# Patient Record
Sex: Male | Born: 1945 | Race: White | Hispanic: No | Marital: Married | Smoking: Never smoker
Health system: Southern US, Community
[De-identification: ages and names within clinical notes are randomized; demographics above are authoritative.]

## PROBLEM LIST (undated history)

## (undated) DIAGNOSIS — M1990 Unspecified osteoarthritis, unspecified site: Secondary | ICD-10-CM

## (undated) DIAGNOSIS — M51369 Other intervertebral disc degeneration, lumbar region without mention of lumbar back pain or lower extremity pain: Secondary | ICD-10-CM

## (undated) DIAGNOSIS — M5126 Other intervertebral disc displacement, lumbar region: Secondary | ICD-10-CM

## (undated) DIAGNOSIS — I2510 Atherosclerotic heart disease of native coronary artery without angina pectoris: Secondary | ICD-10-CM

## (undated) DIAGNOSIS — C61 Malignant neoplasm of prostate: Secondary | ICD-10-CM

## (undated) DIAGNOSIS — R0609 Other forms of dyspnea: Secondary | ICD-10-CM

## (undated) DIAGNOSIS — K219 Gastro-esophageal reflux disease without esophagitis: Secondary | ICD-10-CM

## (undated) DIAGNOSIS — M5136 Other intervertebral disc degeneration, lumbar region: Secondary | ICD-10-CM

## (undated) DIAGNOSIS — M545 Low back pain, unspecified: Secondary | ICD-10-CM

## (undated) DIAGNOSIS — F419 Anxiety disorder, unspecified: Secondary | ICD-10-CM

## (undated) DIAGNOSIS — G8929 Other chronic pain: Secondary | ICD-10-CM

## (undated) DIAGNOSIS — R011 Cardiac murmur, unspecified: Secondary | ICD-10-CM

## (undated) DIAGNOSIS — N486 Induration penis plastica: Secondary | ICD-10-CM

## (undated) DIAGNOSIS — I6523 Occlusion and stenosis of bilateral carotid arteries: Secondary | ICD-10-CM

## (undated) HISTORY — PX: OPEN TREATMENT ZYGOMATIC ARCH FRACTURE: SUR912

## (undated) HISTORY — DX: Occlusion and stenosis of bilateral carotid arteries: I65.23

## (undated) HISTORY — PX: FRACTURE SURGERY: SHX138

## (undated) HISTORY — DX: Malignant neoplasm of prostate: C61

## (undated) HISTORY — PX: PROSTATE BIOPSY: SHX241

## (undated) HISTORY — DX: Induration penis plastica: N48.6

## (undated) HISTORY — DX: Cardiac murmur, unspecified: R01.1

## (undated) HISTORY — DX: Other forms of dyspnea: R06.09

## (undated) HISTORY — DX: Unspecified osteoarthritis, unspecified site: M19.90

---
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
## 1978-11-16 HISTORY — PX: FINGER FRACTURE SURGERY: SHX638

## 1998-10-17 ENCOUNTER — Encounter: Payer: Self-pay | Admitting: Family Medicine

## 1998-10-17 ENCOUNTER — Ambulatory Visit (HOSPITAL_COMMUNITY): Admission: RE | Admit: 1998-10-17 | Discharge: 1998-10-17 | Payer: Self-pay | Admitting: Family Medicine

## 2000-06-19 ENCOUNTER — Encounter: Admission: RE | Admit: 2000-06-19 | Discharge: 2000-06-19 | Payer: Self-pay | Admitting: Family Medicine

## 2000-06-19 ENCOUNTER — Encounter: Payer: Self-pay | Admitting: Family Medicine

## 2002-10-26 ENCOUNTER — Encounter: Admission: RE | Admit: 2002-10-26 | Discharge: 2002-10-26 | Payer: Self-pay | Admitting: Family Medicine

## 2002-10-26 ENCOUNTER — Encounter: Payer: Self-pay | Admitting: Family Medicine

## 2004-12-18 ENCOUNTER — Encounter: Admission: RE | Admit: 2004-12-18 | Discharge: 2004-12-18 | Payer: Self-pay | Admitting: Family Medicine

## 2004-12-30 ENCOUNTER — Encounter: Admission: RE | Admit: 2004-12-30 | Discharge: 2004-12-30 | Payer: Self-pay | Admitting: Family Medicine

## 2009-10-26 ENCOUNTER — Encounter (INDEPENDENT_AMBULATORY_CARE_PROVIDER_SITE_OTHER): Payer: Self-pay | Admitting: *Deleted

## 2010-04-06 ENCOUNTER — Encounter: Payer: Self-pay | Admitting: Family Medicine

## 2010-04-15 ENCOUNTER — Other Ambulatory Visit: Payer: Self-pay | Admitting: Sports Medicine

## 2010-04-15 DIAGNOSIS — M545 Low back pain: Secondary | ICD-10-CM

## 2010-04-15 DIAGNOSIS — M5416 Radiculopathy, lumbar region: Secondary | ICD-10-CM

## 2010-04-16 NOTE — Letter (Signed)
Summary: Colonoscopy Date Change Letter  [facility]  [address] [place], [place] [address]   Phone: [phone]  Fax: [phone]      October 26, 2009 MRN: [id number]   Ronald House [address] [place], [place]  [address]   Dear [name],   Previously you were recommended to have a repeat colonoscopy around this time. Your chart was recently reviewed by Dr. Judie Petit T. Russella Dar of [facility]. Follow up colonoscopy is now recommended in September 2014. This revised recommendation is based on current, nationally recognized guidelines for colorectal cancer screening and polyp surveillance. These guidelines are endorsed by the American Cancer Society, [employer] on Colorectal Cancer as well as numerous other major medical organizations.  Please understand that our recommendation assumes that you do not have any new symptoms such as bleeding, a change in bowel habits, anemia, or significant abdominal discomfort. If you do have any concerning GI symptoms or want to discuss the guideline recommendations, please call to arrange an office visit at your earliest convenience. Otherwise we will keep you in our reminder system and contact you 1-2 months prior to the date listed above to schedule your next colonoscopy.  Thank you,  Judie Petit T. Russella Dar, M.D.  [facility] [phone]

## 2010-04-20 ENCOUNTER — Encounter: Payer: Self-pay | Admitting: Sports Medicine

## 2010-04-29 ENCOUNTER — Ambulatory Visit
Admission: RE | Admit: 2010-04-29 | Discharge: 2010-04-29 | Disposition: A | Discharge status: Home / Self Care | Payer: BC Managed Care – PPO | Source: Ambulatory Visit | Attending: Sports Medicine | Admitting: Sports Medicine

## 2010-04-29 DIAGNOSIS — M545 Low back pain: Secondary | ICD-10-CM

## 2010-04-29 DIAGNOSIS — M5416 Radiculopathy, lumbar region: Secondary | ICD-10-CM

## 2011-03-25 ENCOUNTER — Encounter: Payer: Self-pay | Admitting: Radiation Oncology

## 2011-03-25 NOTE — Progress Notes (Signed)
66 year old male. Married  06/19/2010 PSA 3.35  11/28/2010 PSA 5.56 (done at [facility])  01/14/2011 FISH negative. Scheduled with Dr. Massie Bougie at [facility], but had biopsy done in [place], because he is concerned with travel and post biopsy discomfort.  02/18/11 prostate biopsy revealed G-6 prostate cancer.   Initial consultation with Dr. Dayton Scrape scheduled for 03/25/2010 regarding I-125 seed therapy. CD of scans, labs, path, radiology, and office notes request from Dr. Massie Bougie at [facility].

## 2011-03-26 ENCOUNTER — Ambulatory Visit
Admission: RE | Admit: 2011-03-26 | Discharge: 2011-03-26 | Disposition: A | Discharge status: Home / Self Care | Payer: BC Managed Care – PPO | Source: Ambulatory Visit | Attending: Radiation Oncology | Admitting: Radiation Oncology

## 2011-03-26 ENCOUNTER — Encounter: Payer: Self-pay | Admitting: Radiation Oncology

## 2011-03-26 VITALS — BP 147/81 | HR 59 | Temp 97.7°F | Resp 18 | Ht 70.0 in | Wt 177.3 lb

## 2011-03-26 DIAGNOSIS — C61 Malignant neoplasm of prostate: Secondary | ICD-10-CM

## 2011-03-26 DIAGNOSIS — Z800 Family history of malignant neoplasm of digestive organs: Secondary | ICD-10-CM | POA: Insufficient documentation

## 2011-03-26 HISTORY — DX: Other intervertebral disc degeneration, lumbar region without mention of lumbar back pain or lower extremity pain: M51.369

## 2011-03-26 HISTORY — DX: Other intervertebral disc displacement, lumbar region: M51.26

## 2011-03-26 HISTORY — DX: Other intervertebral disc degeneration, lumbar region: M51.36

## 2011-03-26 NOTE — Progress Notes (Signed)
[facility] Radiation Oncology NEW PATIENT EVALUATION  Name: Ronald House MRN: [id number]  Date: 03/26/2011  DOB: 1945/12/22  Status: outpatient   CC:   Ronald Frock, MD,  Ronald Headings, MD    REFERRING PHYSICIAN: Jethro Bolus House, *   DIAGNOSIS: Stage TI C. favorable risk adenocarcinoma prostate    HISTORY OF PRESENT ILLNESS:  Ronald House is a 66 y.o. male who is seen today for the courtesy of Dr. Alexis House for evaluation of his stage TI C. favorable risk adenocarcinoma prostate. He had been seen at [facility] by Ronald House for [facility], and more recently by Ronald House for evaluation of hematuria. Apparently, no cause was found for his hematuria. However, he was noted to have a rise in his PSA from 3.35-5.6 over a period of 5 months. Ronald House performed ultrasound-guided biopsies on 02/18/2011 and he was found to have adenocarcinoma with a Gleason score of 6 (3+3) involving 5% of one core from the left apex. His gland volume was 51 cc. I do not have his prosthetic length. Aside from a history of gross hematuria he is doing well from a GU and GI standpoint. His IPP S. score is 7. He is potent.    PREVIOUS RADIATION THERAPY: No   PAST MEDICAL HISTORY:  has a past medical history of Peyronie House; Prostate cancer; Arthritis; Undiagnosed cardiac murmurs; and Bulging lumbar disc.     PAST SURGICAL HISTORY:  Past Surgical History  Procedure Date  . History of facial surgery     related to fracture  . History of hand repair     broken right index finger     FAMILY HISTORY: family history includes Benign prostatic hyperplasia in his father; Cancer in his father; and Heart House in his brother. his father had throat cancer at age 73 and died of congestive heart failure and 62. He believes that his mother died at age 74 from a medication error in a nursing home.   SOCIAL HISTORY:  reports that he has never smoked. He has never  used smokeless tobacco. He reports that he drinks alcohol. He reports that he does not use illicit drugs. 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   ALLERGIES: Review of patient's allergies indicates no known allergies.   MEDICATIONS:  Current Outpatient Prescriptions  Medication Sig Dispense Refill  . calcium carbonate (OS-CAL) 600 MG TABS Take 600 mg by mouth daily.       . fish oil-omega-3 fatty acids 1000 MG capsule Take 2 g by mouth daily.        . Multiple Vitamin (MULTIVITAMIN) tablet Take 1 tablet by mouth daily.        . cholecalciferol (VITAMIN D) 1000 UNITS tablet Take 1,000 Units by mouth daily.            REVIEW OF SYSTEMS:  Pertinent items are noted in HPI.    PHYSICAL EXAM:  height is 5\' 10"  (1.778 m) and weight is 177 lb 4.8 oz (80.423 kg). His oral temperature is 97.7 F (36.5 C). His blood pressure is 147/81 and his pulse is 59. His respiration is 18.    Head and neck examination grossly unremarkable. Nodes: Without palpable cervical or supraclavicular lymphadenopathy. Chest: Lungs clear. Back: Without spinal or CVA tenderness. Heart: Regular rate and rhythm. Abdomen: Without masses or organomegaly. Genitalia: Unremarkable to inspection. There is a dorsal penile plaque consistent with Ronald House. Rectal: The prostate gland is slightly enlarged  and is without focal induration or nodularity. Extremities: Without edema.   LABORATORY DATA:  No results found for this basename: WBC, HGB, HCT, MCV, PLT   No results found for this basename: NA, K, CL, CO2   No results found for this basename: ALT, AST, GGT, ALKPHOS, BILITOT      IMPRESSION: Stage TI C. favorable risk adenocarcinoma prostate. House explained to the patient and his wife that his prognosis is related to his stage, PSA level, and Gleason score. All are favorable. Other prognostic factors include House volume and PSA doubling time. His management options include surgery versus close  observation/surveillance, and radiation therapy. Radiation therapy options include seed implantation alone or external beam/IMRT. House discussed in detail the potential acute and late toxicities of radiation therapy. House think he is a good candidate for any of these options. He is somewhat interested in seed implantation and would like to proceed with a CT arch study. He understands that we would need to have an open arch with a prosthetic length and less than 5.0 cm for seed implantation. His gland size of 51 cc may be borderline. We'll give him scheduled for a CT arch study in the near future, and then he can decide on his course of therapy. If his gland is too large that he would not be interested in downsizing with Ronald House, however he could consider Ronald House for at least 8-12 months.   PLAN: As above. He'll return next week for his CT arch study.   House spent 60 minutes minutes face to face with the patient and more than 50% of that time was spent in counseling and/or coordination of care.

## 2011-03-26 NOTE — Progress Notes (Signed)
Patient presents to the clinic today accompanied by his wife for an initial consultation with Dr. Dayton Scrape reference prostate ca. Patient is alert and oriented to person, place, and time. No distress noted. Steady gait noted. Pleasant affect noted. Patient denies pain at this time. Patient reports hematuria with clots. Patient reports only slight burning at initiation of urination and bowel movements. Patient reports that his father had his prostate removed at age 44 because it was enlarged. Patient denies nausea, vomiting, diarrhea, constipation, lightheadedness or dizziness. Patient reports eating without difficulty. Denies weight loss. Patient reports he does not sleep well at night but that is a long standing issue.

## 2011-03-26 NOTE — Progress Notes (Signed)
Please see the Nurse Progress Note in the MD Initial Consult Encounter for this patient. 

## 2011-03-27 NOTE — Progress Notes (Signed)
Encounter addended by: Ardell Isaacs on: 03/27/2011 11:27 AM<BR>     Documentation filed: Visit Diagnoses, Notes Section

## 2011-03-27 NOTE — Progress Notes (Signed)
Encounter addended by: Ardell Isaacs on: 03/27/2011  1:21 PM<BR>     Documentation filed: Charges VN

## 2011-03-27 NOTE — Progress Notes (Signed)
Encounter addended by: Ardell Isaacs on: 03/27/2011  1:27 PM<BR>     Documentation filed: Inpatient Patient Education

## 2011-03-27 NOTE — Progress Notes (Signed)
Encounter addended by: Ardell Isaacs on: 03/27/2011 11:03 AM<BR>     Documentation filed: Inpatient Document Flowsheet

## 2011-03-27 NOTE — Progress Notes (Signed)
IPSS score of 7 documented. Complete PATIENT MEASURE OF DISTRESS worksheet with a score of 0 turned into Child psychotherapist.

## 2011-03-28 ENCOUNTER — Telehealth: Payer: Self-pay | Admitting: *Deleted

## 2011-03-31 ENCOUNTER — Ambulatory Visit
Admission: RE | Admit: 2011-03-31 | Discharge: 2011-03-31 | Disposition: A | Discharge status: Home / Self Care | Payer: BC Managed Care – PPO | Source: Ambulatory Visit | Attending: Radiation Oncology | Admitting: Radiation Oncology

## 2011-03-31 ENCOUNTER — Encounter: Payer: Self-pay | Admitting: Radiation Oncology

## 2011-03-31 VITALS — BP 144/84 | HR 58 | Resp 18

## 2011-03-31 DIAGNOSIS — C61 Malignant neoplasm of prostate: Secondary | ICD-10-CM

## 2011-03-31 NOTE — Progress Notes (Signed)
Treatment planning note: The patient was taken to the CT simulator. His pelvis was scanned. I contoured his prostate. I performed a CT arch study in his prostate volume was found to be 55.9 cc with a prosthetic length of 4.8 cm. His arch is open. He is a candidate for a seed implantation. I prescribing 14,500 cGy utilizing iodine-125 seeds. He will be implanted with the Nucletron system. We will coordinate his implant schedule with Dr. Patsi Sears.      Prostate volume: 55.9 cc Length: 4.8 cm. Arch open.

## 2011-04-08 ENCOUNTER — Other Ambulatory Visit: Payer: Self-pay | Admitting: Urology

## 2011-04-16 ENCOUNTER — Ambulatory Visit (HOSPITAL_BASED_OUTPATIENT_CLINIC_OR_DEPARTMENT_OTHER)
Admission: RE | Admit: 2011-04-16 | Discharge: 2011-04-16 | Disposition: A | Discharge status: Home / Self Care | Payer: BC Managed Care – PPO | Source: Ambulatory Visit | Attending: Urology | Admitting: Urology

## 2011-04-16 ENCOUNTER — Other Ambulatory Visit: Payer: Self-pay

## 2011-04-16 ENCOUNTER — Ambulatory Visit (HOSPITAL_COMMUNITY): Admission: RE | Admit: 2011-04-16 | Payer: BC Managed Care – PPO | Source: Ambulatory Visit

## 2011-04-16 ENCOUNTER — Ambulatory Visit
Admission: RE | Admit: 2011-04-16 | Discharge: 2011-04-16 | Disposition: A | Discharge status: Home / Self Care | Payer: BC Managed Care – PPO | Source: Ambulatory Visit | Attending: Urology | Admitting: Urology

## 2011-04-16 ENCOUNTER — Other Ambulatory Visit: Payer: Self-pay | Admitting: Urology

## 2011-04-16 DIAGNOSIS — Z01818 Encounter for other preprocedural examination: Secondary | ICD-10-CM

## 2011-04-16 DIAGNOSIS — Z01810 Encounter for preprocedural cardiovascular examination: Secondary | ICD-10-CM | POA: Insufficient documentation

## 2011-05-09 NOTE — Telephone Encounter (Signed)
xxx

## 2011-05-15 LAB — COMPREHENSIVE METABOLIC PANEL
ALT: 17 U/L (ref 0–53)
AST: 18 U/L (ref 0–37)
CO2: 32 mEq/L (ref 19–32)
Calcium: 9.7 mg/dL (ref 8.4–10.5)
Chloride: 104 mEq/L (ref 96–112)
Creatinine, Ser: 1.11 mg/dL (ref 0.50–1.35)
GFR calc Af Amer: 79 mL/min — ABNORMAL LOW (ref >90)
GFR calc non Af Amer: 68 mL/min — ABNORMAL LOW (ref >90)
Glucose, Bld: 94 mg/dL (ref 70–99)
Sodium: 144 mEq/L (ref 135–145)
Total Bilirubin: 0.4 mg/dL (ref 0.3–1.2)

## 2011-05-15 LAB — APTT: aPTT: 33 seconds (ref 24–37)

## 2011-05-15 LAB — CBC
Hemoglobin: 14.3 g/dL (ref 13.0–17.0)
MCH: 29.4 pg (ref 26.0–34.0)
MCV: 90.9 fL (ref 78.0–100.0)
Platelets: 226 10*3/uL (ref 150–400)
RBC: 4.86 MIL/uL (ref 4.22–5.81)
WBC: 7.5 10*3/uL (ref 4.0–10.5)

## 2011-05-19 ENCOUNTER — Encounter (HOSPITAL_BASED_OUTPATIENT_CLINIC_OR_DEPARTMENT_OTHER): Payer: Self-pay | Admitting: *Deleted

## 2011-05-19 NOTE — Progress Notes (Signed)
NPO AFTER MN. ARRIVES AT 0945. LAB RESULTS, CURRENT EKG AND CXR IN EPIC . WILL DO FLEET ENEMA AM OF SURG.

## 2011-05-21 ENCOUNTER — Telehealth: Payer: Self-pay | Admitting: *Deleted

## 2011-05-21 NOTE — Telephone Encounter (Signed)
XXX

## 2011-05-22 ENCOUNTER — Ambulatory Visit (HOSPITAL_BASED_OUTPATIENT_CLINIC_OR_DEPARTMENT_OTHER): Payer: BC Managed Care – PPO | Admitting: Anesthesiology

## 2011-05-22 ENCOUNTER — Encounter (HOSPITAL_BASED_OUTPATIENT_CLINIC_OR_DEPARTMENT_OTHER): Payer: Self-pay | Admitting: Anesthesiology

## 2011-05-22 ENCOUNTER — Encounter (HOSPITAL_BASED_OUTPATIENT_CLINIC_OR_DEPARTMENT_OTHER)
Admission: RE | Disposition: A | Discharge status: Home / Self Care | Payer: Self-pay | Source: Ambulatory Visit | Attending: Urology

## 2011-05-22 ENCOUNTER — Ambulatory Visit (HOSPITAL_COMMUNITY): Payer: BC Managed Care – PPO

## 2011-05-22 ENCOUNTER — Ambulatory Visit (HOSPITAL_BASED_OUTPATIENT_CLINIC_OR_DEPARTMENT_OTHER)
Admission: RE | Admit: 2011-05-22 | Discharge: 2011-05-22 | Disposition: A | Discharge status: Home / Self Care | Payer: BC Managed Care – PPO | Source: Ambulatory Visit | Attending: Urology | Admitting: Urology

## 2011-05-22 ENCOUNTER — Encounter: Payer: Self-pay | Admitting: Radiation Oncology

## 2011-05-22 ENCOUNTER — Encounter (HOSPITAL_BASED_OUTPATIENT_CLINIC_OR_DEPARTMENT_OTHER): Payer: Self-pay | Admitting: *Deleted

## 2011-05-22 DIAGNOSIS — Z8739 Personal history of other diseases of the musculoskeletal system and connective tissue: Secondary | ICD-10-CM | POA: Insufficient documentation

## 2011-05-22 DIAGNOSIS — C61 Malignant neoplasm of prostate: Secondary | ICD-10-CM

## 2011-05-22 DIAGNOSIS — R310 Gross hematuria: Secondary | ICD-10-CM | POA: Insufficient documentation

## 2011-05-22 DIAGNOSIS — R011 Cardiac murmur, unspecified: Secondary | ICD-10-CM | POA: Insufficient documentation

## 2011-05-22 DIAGNOSIS — R972 Elevated prostate specific antigen [PSA]: Secondary | ICD-10-CM | POA: Insufficient documentation

## 2011-05-22 DIAGNOSIS — Z01812 Encounter for preprocedural laboratory examination: Secondary | ICD-10-CM | POA: Insufficient documentation

## 2011-05-22 HISTORY — PX: RADIOACTIVE SEED IMPLANT: SHX5150

## 2011-05-22 HISTORY — DX: Other intervertebral disc degeneration, lumbar region: M51.36

## 2011-05-22 HISTORY — DX: Other intervertebral disc degeneration, lumbar region without mention of lumbar back pain or lower extremity pain: M51.369

## 2011-05-22 SURGERY — INSERTION, RADIATION SOURCE, PROSTATE
Anesthesia: General

## 2011-05-22 MED ORDER — LIDOCAINE HCL (CARDIAC) 20 MG/ML IV SOLN
INTRAVENOUS | Status: DC | PRN
  Administered 2011-05-22: 80 mg via INTRAVENOUS

## 2011-05-22 MED ORDER — CIPROFLOXACIN IN D5W 400 MG/200ML IV SOLN
400.0000 mg | INTRAVENOUS | Status: AC
  Administered 2011-05-22: 400 mg via INTRAVENOUS

## 2011-05-22 MED ORDER — HYDROCODONE-ACETAMINOPHEN 7.5-650 MG PO TABS: 1.0000 | ORAL_TABLET | Freq: Four times a day (QID) | ORAL | Status: AC | PRN

## 2011-05-22 MED ORDER — KETOROLAC TROMETHAMINE 30 MG/ML IJ SOLN
INTRAMUSCULAR | Status: DC | PRN
  Administered 2011-05-22: 30 mg via INTRAVENOUS

## 2011-05-22 MED ORDER — PROPOFOL 10 MG/ML IV EMUL
INTRAVENOUS | Status: DC | PRN
  Administered 2011-05-22: 170 mg via INTRAVENOUS

## 2011-05-22 MED ORDER — PROMETHAZINE HCL 25 MG/ML IJ SOLN: 6.2500 mg | INTRAMUSCULAR | Status: DC | PRN

## 2011-05-22 MED ORDER — TAMSULOSIN HCL 0.4 MG PO CAPS: 0.4000 mg | ORAL_CAPSULE | Freq: Every day | ORAL | Status: DC

## 2011-05-22 MED ORDER — LACTATED RINGERS IV SOLN
INTRAVENOUS | Status: DC
  Administered 2011-05-22 (×2): via INTRAVENOUS

## 2011-05-22 MED ORDER — TRIMETHOPRIM 100 MG PO TABS: 100.0000 mg | ORAL_TABLET | ORAL | Status: AC

## 2011-05-22 MED ORDER — FENTANYL CITRATE 0.05 MG/ML IJ SOLN: 25.0000 ug | INTRAMUSCULAR | Status: DC | PRN

## 2011-05-22 MED ORDER — DEXAMETHASONE SODIUM PHOSPHATE 4 MG/ML IJ SOLN
INTRAMUSCULAR | Status: DC | PRN
  Administered 2011-05-22: 8 mg via INTRAVENOUS

## 2011-05-22 MED ORDER — ONDANSETRON HCL 4 MG/2ML IJ SOLN
INTRAMUSCULAR | Status: DC | PRN
  Administered 2011-05-22: 4 mg via INTRAVENOUS

## 2011-05-22 MED ORDER — MIDAZOLAM HCL 5 MG/5ML IJ SOLN
INTRAMUSCULAR | Status: DC | PRN
  Administered 2011-05-22: 2 mg via INTRAVENOUS

## 2011-05-22 MED ORDER — FLEET ENEMA 7-19 GM/118ML RE ENEM: 1.0000 | ENEMA | Freq: Once | RECTAL | Status: DC

## 2011-05-22 MED ORDER — IOHEXOL 350 MG/ML SOLN
INTRAVENOUS | Status: DC | PRN
  Administered 2011-05-22: 7 mL via INTRAVENOUS

## 2011-05-22 MED ORDER — ACETAMINOPHEN 10 MG/ML IV SOLN
1000.0000 mg | Freq: Four times a day (QID) | INTRAVENOUS | Status: DC
  Administered 2011-05-22: 1000 mg via INTRAVENOUS

## 2011-05-22 MED ORDER — FENTANYL CITRATE 0.05 MG/ML IJ SOLN
INTRAMUSCULAR | Status: DC | PRN
  Administered 2011-05-22 (×2): 25 ug via INTRAVENOUS
  Administered 2011-05-22 (×3): 50 ug via INTRAVENOUS

## 2011-05-22 SURGICAL SUPPLY — 25 items
BAG URINE DRAINAGE (UROLOGICAL SUPPLIES) ×2 IMPLANT
BLADE SURG ROTATE 9660 (MISCELLANEOUS) ×2 IMPLANT
CATH FOLEY 2WAY SLVR  5CC 16FR (CATHETERS) ×2
CATH FOLEY 2WAY SLVR 5CC 16FR (CATHETERS) ×2 IMPLANT
CATH ROBINSON RED A/P 20FR (CATHETERS) ×2 IMPLANT
CLOTH BEACON ORANGE TIMEOUT ST (SAFETY) ×2 IMPLANT
COVER MAYO STAND STRL (DRAPES) ×2 IMPLANT
COVER TABLE BACK 60X90 (DRAPES) ×2 IMPLANT
DRAPE CAMERA CLOSED 9X96 (DRAPES) ×2 IMPLANT
DRSG TEGADERM 4X4.75 (GAUZE/BANDAGES/DRESSINGS) ×2 IMPLANT
DRSG TEGADERM 8X12 (GAUZE/BANDAGES/DRESSINGS) ×2 IMPLANT
GLOVE BIO SURGEON STRL SZ7 (GLOVE) ×1 IMPLANT
GLOVE BIO SURGEON STRL SZ7.5 (GLOVE) ×8 IMPLANT
GLOVE BIOGEL PI IND STRL 6.5 (GLOVE) IMPLANT
GLOVE BIOGEL PI INDICATOR 6.5 (GLOVE) ×3
GLOVE ECLIPSE 8.0 STRL XLNG CF (GLOVE) IMPLANT
GOWN STRL REIN XL XLG (GOWN DISPOSABLE) ×2 IMPLANT
GOWN XL W/COTTON TOWEL STD (GOWNS) ×2 IMPLANT
HOLDER FOLEY CATH W/STRAP (MISCELLANEOUS) ×2 IMPLANT
IV WATER IRR. 1000ML (IV SOLUTION) ×2 IMPLANT
PACK CYSTOSCOPY (CUSTOM PROCEDURE TRAY) ×2 IMPLANT
Radioactive seeds ×1 IMPLANT
SYRINGE 10CC LL (SYRINGE) ×2 IMPLANT
UNDERPAD 30X30 INCONTINENT (UNDERPADS AND DIAPERS) ×4 IMPLANT
WATER STERILE IRR 500ML POUR (IV SOLUTION) ×2 IMPLANT

## 2011-05-22 NOTE — Procedures (Signed)
[facility] Radiation Oncology Brachytherapy Operative Procedure Note  Name: Ronald House MRN: [id number]  Date: 05/22/2011  DOB: [birth year]  Status:outpatient    CC: Dr. Alexis Frock  DIAGNOSIS: A 66 year old gentlemen with stage T1 C. adenocarcinoma of the prostate with a Gleason of 6 and a PSA of 5.6.  PROCEDURE: Insertion of radioactive I-125 seeds into the prostate gland.  RADIATION DOSE: 14,500 Gy, definitive.  TECHNIQUE: Ronald House was brought to the operating room with Dr. Patsi Sears. He was placed in the dorsolithotomy position. He was catheterized and a rectal tube was inserted. The perineum was shaved, prepped and draped. The ultrasound probe was then introduced into the rectum to see the prostate gland.  TREATMENT DEVICE: A needle grid was attached to the ultrasound probe stand and anchor needles were placed.  COMPLEX ISODOSE CALCULATION: The prostate was imaged in 3D using a sagittal sweep of the prostate probe. These images were transferred to the planning computer. There, the prostate, urethra and rectum were defined on each axial reconstructed image. Then, the software created an optimized plan and a few seed positions were adjusted. Then the accepted plan was uploaded to the seed Selectron afterloading unit.  SPECIAL TREATMENT PROCEDURE/SUPERVISION AND HANDLING: The Nucletron FIRST system was used to place the needles under sagittal guidance. A total of 22 needles were used to deposit 82 seeds in the prostate gland. The individual seed activity was 0.545 mCi for a total implant activity of 44.7 mCi.  COMPLEX SIMULATION: At the end of the procedure, an anterior radiograph of the pelvis was obtained to document seed positioning and count. Cystoscopy was performed to check the urethra and bladder.  MICRODOSIMETRY: At the end of the procedure, the patient was emitting 0.6 mrem/hr at 1 meter. Accordingly, he was considered safe for hospital discharge.  PLAN: The  patient will return to the radiation oncology clinic for post implant CT dosimetry in three weeks.

## 2011-05-22 NOTE — Interval H&P Note (Signed)
History and Physical Interval Note:  05/22/2011 11:04 AM  Ronald House  has presented today for surgery, with the diagnosis of PROSTATE CANCER  The various methods of treatment have been discussed with the patient and family. After consideration of risks, benefits and other options for treatment, the patient has consented to  Procedure(s) (LRB): RADIOACTIVE SEED IMPLANT (N/A) as a surgical intervention .  The patients' history has been reviewed, patient examined, no change in status, stable for surgery.  I have reviewed the patients' chart and labs.  Questions were answered to the patient's satisfaction.     Jethro Bolus I

## 2011-05-22 NOTE — Discharge Instructions (Addendum)
Brachytherapy for Prostate Cancer Brachytherapy for prostate cancer is radiation treatment. It uses ultrasound to guide the insertion of tiny radioactive seeds into the prostate. This allows additional radiation to be given to the tumor and less radiation to the surrounding normal tissues. The seeds are about the thickness of a pencil lead and just over ? inch long. Brachytherapy is associated with fewer side effects than external beam radiation therapy alone. Usually, there is a delay of about 2 months after the seed implants before external radiation is begun, if external radiation is also used. LET YOUR CAREGIVER KNOW ABOUT:   Any allergies.   All medicines you are taking including vitamins, herbs, eyedrops, and over-the-counter medicines and creams.   Use of steroids (through mouth or as creams).   Previous problems with medicines that make a person sleep (general anesthetics) or numbing medicines.   History of bleeding or blood problems.   Previous surgery.   Any health problems.  RISKS AND COMPLICATIONS   Erectile dysfunction (impotence).   Bleeding.   Infection.   Reaction to anesthesia.   Involuntary leakage of urine (urinary incontinence).  BEFORE THE PROCEDURE  Ask your caregiver about changing or stopping your regular medicines. PROCEDURE   The procedure is performed under a general or spinal anesthetic.   An ultrasound is used to guide the long, thin metal tubes into the prostate gland. These tubes help place the seeds.   The metal tubes are then removed.   The seeds can be left in place permanently.  AFTER THE PROCEDURE   You will have a catheter in your bladder. The catheter will be removed later in your urologist's office.   Avoid being around children and pregnant women. Over time, the radiation will decrease and become inactive.   X-rays and CT scans may be taken to show the location of the seeds.   A specialist can calculate the exact amount of  radiation received.  What to expect:  A little blood in the urine for a couple days is normal.   Your urine stream may be weaker for a couple weeks.  Document Released: 08/11/2005 Document Revised: 02/20/2011 Document Reviewed: 08/25/2005 Exit[employer] Patient Information 2012 [employer]. 

## 2011-05-22 NOTE — Transfer of Care (Signed)
Immediate Anesthesia Transfer of Care Note  Patient: Ronald House  Procedure(s) Performed: Procedure(s) (LRB): RADIOACTIVE SEED IMPLANT (N/A)  Patient Location: PACU  Anesthesia Type: General  Level of Consciousness: awake, oriented, sedated and patient cooperative  Airway & Oxygen Therapy: Patient Spontanous Breathing and Patient connected to face mask oxygen  Post-op Assessment: Report given to PACU RN and Post -op Vital signs reviewed and stable  Post vital signs: Reviewed and stable  Complications: No apparent anesthesia complications

## 2011-05-22 NOTE — Anesthesia Procedure Notes (Signed)
Procedure Name: LMA Insertion Date/Time: 05/22/2011 11:13 AM Performed by: Renella Cunas D Pre-anesthesia Checklist: Patient identified, Emergency Drugs available, Suction available and Patient being monitored Patient Re-evaluated:Patient Re-evaluated prior to inductionOxygen Delivery Method: Circle System Utilized Preoxygenation: Pre-oxygenation with 100% oxygen Intubation Type: IV induction Ventilation: Mask ventilation without difficulty LMA: LMA inserted LMA Size: 4.0 Number of attempts: 1 Airway Equipment and Method: bite block Placement Confirmation: positive ETCO2 Tube secured with: Tape Dental Injury: Teeth and Oropharynx as per pre-operative assessment

## 2011-05-22 NOTE — Brief Op Note (Signed)
05/22/2011  1:12 PM  PATIENT:  Ronald House  66 y.o. male  PRE-OPERATIVE DIAGNOSIS:  PROSTATE CANCER  POST-OPERATIVE DIAGNOSIS:  PROSTATE CANCER  PROCEDURE:  Procedure(s) (LRB): RADIOACTIVE SEED IMPLANT (N/A)  SURGEON:  Surgeon(s) and Role:    * Kathi Ludwig, MD - Primary    * Maryln Gottron, MD -   PHYSI radiation therapy  ASSISTANTS: none  None   ANESTHESIA:   general  EBL:  Total I/O In: 200 [I.V.:200] Out: -   BLOOD ADMINISTERED:none  DRAINS: none   LOCAL MEDICATIONS USED:  NONE   SPECIMEN:  No Specimen  DISPOSITION OF SPECIMEN:  N/A  COUNTS:  YES  TOURNIQUET:  * No tourniquets in log *  DICTATION: .Dragon Dictation: With the patient in the dorsal lithotomy position, a second timeout was observed, after originally bringing the patient in the operating room, placing the 20 operating table in beginning general LMA anesthesia. Radiation therapy then placed a transrectal probe, and ultrasound the patient, and began real-time iodine seed planning.  After the second timeout, the patient then underwent implantation of 82 I-125 seeds, and 24 activated needles, with a total dose delivery of 145 Gy. Flexable cystoscopy showed no evidence of seeds within the urethra or the bladder. The patient was noted to have BPH with an enlarged median lobe.   PLAN OF CARE: Admit for overnight observation  PATIENT DISPOSITION:  PACU - hemodynamically stable.   Delay start of Pharmacological VTE agent (>24hrs) due to surgical blood loss or risk of bleeding: not applicable

## 2011-05-22 NOTE — Anesthesia Preprocedure Evaluation (Signed)
Anesthesia Evaluation  Patient identified by MRN, date of birth, ID band Patient awake    Reviewed: Allergy & Precautions, H&P , NPO status , Patient's Chart, lab work & pertinent test results  Airway Mallampati: II TM Distance: >3 FB Neck ROM: Full    Dental No notable dental hx.    Pulmonary  CXR: COPD changes. breath sounds clear to auscultation  Pulmonary exam normal       Cardiovascular negative cardio ROS  Rhythm:Regular Rate:Normal  ECG: NSR, nonspecific st and t wave changes. H/o murmur as a child, but has not been heard in a long time.   Neuro/Psych negative neurological ROS  negative psych ROS   GI/Hepatic negative GI ROS, Neg liver ROS,   Endo/Other  negative endocrine ROS  Renal/GU negative Renal ROS  negative genitourinary   Musculoskeletal negative musculoskeletal ROS (+)   Abdominal   Peds negative pediatric ROS (+)  Hematology negative hematology ROS (+)   Anesthesia Other Findings   Reproductive/Obstetrics negative OB ROS                           Anesthesia Physical Anesthesia Plan  ASA: I  Anesthesia Plan: General   Post-op Pain Management:    Induction: Intravenous  Airway Management Planned: LMA  Additional Equipment:   Intra-op Plan:   Post-operative Plan: Extubation in OR  Informed Consent: I have reviewed the patients History and Physical, chart, labs and discussed the procedure including the risks, benefits and alternatives for the proposed anesthesia with the patient or authorized representative who has indicated his/her understanding and acceptance.   Dental advisory given  Plan Discussed with: CRNA  Anesthesia Plan Comments: (Denies GERD. Some minor lower cervical, upper thoracic soreness thought secondary to arthritis. Good ROM neck.)        Anesthesia Quick Evaluation

## 2011-05-22 NOTE — Progress Notes (Signed)
End of treatment summary:  Diagnosis:  Stage TI C   favorable risk adenocarcinoma prostate  Requesting physician: Dr. Alexis Frock  Intent: Curative  Implant date 05/22/2011  Site/dose: Prostate 14,500 cGy, isotope iodine 125 utilizing 81 seeds and 22 active needles. Individual seed activity 0.54 mCi per seed for a total implant activity of 34.6.millicuries.  Narrative: The patient appears to have undergone a successful Nucletron seeds electron implant with Dr. Patsi Sears.  Plan: Followup visit in 3 weeks with Dr. Patsi Sears and myself. At that time we will obtain a CT scan for his post implant dosimetry.

## 2011-05-22 NOTE — Anesthesia Postprocedure Evaluation (Signed)
Anesthesia Post Note  Patient: Ronald House  Procedure(s) Performed: Procedure(s) (LRB): RADIOACTIVE SEED IMPLANT (N/A)  Anesthesia type: General  Patient location: PACU  Post pain: Pain level controlled  Post assessment: Post-op Vital signs reviewed  Last Vitals:  Filed Vitals:   05/22/11 1345  BP: 126/75  Pulse:   Temp:   Resp:     Post vital signs: Reviewed  Level of consciousness: sedated  Complications: No apparent anesthesia complications

## 2011-05-22 NOTE — H&P (Signed)
   Active Problems Problems  1. Prostate Cancer 185  History of Present Illness        66 yo married male returns today I-125 seed implantation for  recent dx of prostate cancer.  He is s/p prostate biopsy on 02/18/11, showing  G 3+3=6 CaP in the L medial apex and atypical tissue in the R medial apex. Marland Kitchen    Hx of gross hematuria with clot formation x 3 days in Oct. 2012. Cysto and IV contrast at [facility] reported as negative.  He has a hx of peyronie's diease. PSA 5.56 at [facility], and pt was recomended to have pbx.   01/14/11  FISH - negative  11/28/10  PSA - 5.56  06/19/10  PSA - 3.35   Past Medical History Problems  1. History of  Arthritis V13.4 2. History of  Murmurs 785.2  Surgical History Problems  1. History of  Facial Surgery 2. History of  Hand Repair  Current Meds 1. Calcium TABS; Therapy: (Recorded:24Oct2012) to 2. Fish Oil 1000 MG Oral Capsule; Therapy: (Recorded:24Oct2012) to 3. Multi-Day TABS; Therapy: (Recorded:24Oct2012) to 4. Vitamin D TABS; Therapy: (Recorded:24Oct2012) to  Allergies Medication  1. No Known Drug Allergies  Family History Problems  1. Paternal history of  Benign Prostatic Hypertrophy 2. Paternal history of  Death In The Family Father 3. Maternal history of  Death In The Family Mother 4. Family history of  Family Health Status Number Of Children 5. Fraternal history of  Hypertension V17.49 6. Fraternal history of  Nephrolithiasis  Social History Problems    Alcohol Use   Caffeine Use   Marital History - Currently Married   Never A Smoker   Occupation: Denied    Tobacco Use  Vitals Vital Signs [Data Includes: Last 1 Day]  02Jan2013 04:24PM  Blood Pressure: 123 / 80 Temperature: 97.5 F Heart Rate: 58  Physical Exam Rectal: Rectal exam demonstrates normal sphincter tone, no tenderness and no masses. Estimated prostate size is 4+. The prostate has no nodularity and is not tender. The left seminal vesicle is  nonpalpable. The right seminal vesicle is nonpalpable. The perineum is normal on inspection.    Results/Data Assessment Assessed  1. PSA,Elevated 790.93 2. Prostate Cancer 185 3. Gross Hematuria 599.71      66 yo male with G-6 prostate cancer. We have had a 2nd opinion with Dr. Porfirio Mylar recommendation for I-125 seed therapy. 51.15cc gland.   We have again discussed his prostate cancer, with 1 biopsy seen, G 3+3=6. We  reviewed his options of:  no therapy, watchful waiting with future biopsy, and delayed hormone therapy; and treatments of LRRP, iodine seed Rs, Radiation therapy, and cryotherapy, chemotherapy and research therapy. He has considered his options and has decided ot pursue I-125 seed Rx.   Plan PSA,Elevated (790.93), Prostate Cancer (185), Gross Hematuria (599.71)    Signatures Electronically signed by : Jethro Bolus, M.D.; Mar 19 2011  5:07PM

## 2011-05-23 NOTE — Op Note (Signed)
Patsi Sears, MD Physician Signed Brief Op Note 05/22/2011 1:12 PM   05/22/2011  1:12 PM  PATIENT: Ronald House 66 y.o. male  PRE-OPERATIVE DIAGNOSIS: PROSTATE CANCER  POST-OPERATIVE DIAGNOSIS: PROSTATE CANCER  PROCEDURE: Procedure(s) (LRB):  RADIOACTIVE SEED IMPLANT (N/A)  SURGEON: Surgeon(s) and Role:  * Kathi Ludwig, MD - Primary  * Maryln Gottron, MD -  PHYSI radiation therapy  ASSISTANTS: none None  ANESTHESIA: general  EBL: Total I/O  In: 200 [I.V.:200]  Out: -  BLOOD ADMINISTERED:none  DRAINS: none  LOCAL MEDICATIONS USED: NONE  SPECIMEN: No Specimen  DISPOSITION OF SPECIMEN: N/A  COUNTS: YES  TOURNIQUET: * No tourniquets in log *  DICTATION: .Dragon Dictation: With the patient in the dorsal lithotomy position, a second timeout was observed, after originally bringing the patient in the operating room, placing the 20 operating table in beginning general LMA anesthesia. Radiation therapy then placed a transrectal probe, and ultrasound the patient, and began real-time iodine seed planning.  After the second timeout, the patient then underwent implantation of 82 I-125 seeds, and 24 activated needles, with a total dose delivery of 145 Gy. Flexable cystoscopy showed no evidence of seeds within the urethra or the bladder. The patient was noted to have BPH with an enlarged median lobe.  PLAN OF CARE: Admit for overnight observation  PATIENT DISPOSITION: PACU - hemodynamically stable.

## 2011-05-26 ENCOUNTER — Encounter (HOSPITAL_BASED_OUTPATIENT_CLINIC_OR_DEPARTMENT_OTHER): Payer: Self-pay | Admitting: Urology

## 2011-05-29 ENCOUNTER — Encounter (HOSPITAL_BASED_OUTPATIENT_CLINIC_OR_DEPARTMENT_OTHER): Payer: Self-pay

## 2011-06-10 ENCOUNTER — Telehealth: Payer: Self-pay | Admitting: *Deleted

## 2011-06-10 NOTE — Telephone Encounter (Signed)
XXXX 

## 2011-06-11 ENCOUNTER — Ambulatory Visit
Admit: 2011-06-11 | Discharge: 2011-06-11 | Disposition: A | Discharge status: Home / Self Care | Payer: BC Managed Care – PPO | Attending: Radiation Oncology | Admitting: Radiation Oncology

## 2011-06-11 ENCOUNTER — Ambulatory Visit
Admission: RE | Admit: 2011-06-11 | Discharge: 2011-06-11 | Disposition: A | Discharge status: Home / Self Care | Payer: BC Managed Care – PPO | Source: Ambulatory Visit | Attending: Radiation Oncology | Admitting: Radiation Oncology

## 2011-06-11 ENCOUNTER — Encounter: Payer: Self-pay | Admitting: Radiation Oncology

## 2011-06-11 VITALS — BP 142/88 | HR 58 | Temp 96.8°F | Resp 18 | Wt 176.5 lb

## 2011-06-11 DIAGNOSIS — C61 Malignant neoplasm of prostate: Secondary | ICD-10-CM

## 2011-06-11 NOTE — Progress Notes (Signed)
Patient presents to the clinic today accompanied by his wife for a post seed follow up with Dr. Dayton Scrape. Patient is alert and oriented to person, place, and time. No distress noted. Steady gait noted. Pleasant affect noted. Patient denies pain at this time. Patient reports that he was seen by Dr. Imelda Pillow PA this morning who did an ultrasound. Patient reports pressure and difficulty emptying therefore Uribel samples were given. Patient denies hematuria but, reports seeing scant blood in stool. Patient denies burning with urination. Patient reports getting up on average four times per night to void. Frequency is interrupting patient's sleep pattern. Frequency has increased since surgery. UA done today at urologist office today patient reports was normal. Reported all findings to Dr. Dayton Scrape.

## 2011-06-11 NOTE — Progress Notes (Signed)
Complex simulation note  The patient underwent complex simulation for his post implant dosimetry. He was taken to the CT simulator and placed supine. His pelvis was scanned. The CT data set was sent to the [facility] system for contouring of his prostate and rectum. Of note is that there are significant calcifications within the prostate which may complicate  seed recognition for his post implant dosimetry. He is now ready for 3-D simulation

## 2011-06-11 NOTE — Progress Notes (Signed)
Followup note: The patient is seen today approximately 3 weeks following his prostate seed implant with Dr. Patsi Sears. He does report urinary frequency with nocturia x4. He has been on Flomax, and he saw Dr. Imelda Pillow PA today who gave him Uribel samples. He denies pain but does have a "pressure sensation" in the vicinity of the prostate. No GI difficulties.  He is not examined today.  His CT scan for his post implant dosimetry showed excellent seed  distribution from the base to the apex. No seeds in the rectum.  Impression: He appears to have obstructive and irritative symptoms which should improve with Flomax and Uribel.  Plan: He'll see Dr. Patsi Sears for a followup visit and PSA determination in approximately 3 weeks. He'll call me one month if his symptoms do not improve. We will move ahead with his post implant dosimetry and forward the results to Dr. Patsi Sears.

## 2011-06-23 ENCOUNTER — Encounter: Payer: Self-pay | Admitting: Radiation Oncology

## 2011-06-23 NOTE — Progress Notes (Signed)
Post implant CT dosimetry/3-D simulation note  [name] underwent post-implant CT dosimetry/3-D simulation on 06/23/2011. His intraoperative prostate volume by ultrasound was 61.3 cc while his postoperative prostate volume by CT was 52.3 cc. After careful review it is felt that the prostate was slightly over contoured in the operating room. Of note is that Dr. Imelda Pillow posted volume by ultrasound at the time of his biopsy was 55 cc. Dose volume histograms were obtained for the prostate and rectum. His prostate D 90 is 114.6% and V1 198.06%, both excellent. Only 0.3 cc of rectum received the prescribed dose of 14,500 cGy.  In summary, [name] has excellent post implant dosimetry with a low risk for late rectal toxicity.

## 2011-09-08 ENCOUNTER — Ambulatory Visit (INDEPENDENT_AMBULATORY_CARE_PROVIDER_SITE_OTHER): Payer: BC Managed Care – PPO | Admitting: Family Medicine

## 2011-09-08 ENCOUNTER — Ambulatory Visit: Payer: BC Managed Care – PPO

## 2011-09-08 VITALS — BP 155/82 | HR 67 | Temp 98.3°F | Resp 18 | Ht 70.0 in | Wt 177.0 lb

## 2011-09-08 DIAGNOSIS — M79609 Pain in unspecified limb: Secondary | ICD-10-CM

## 2011-09-08 DIAGNOSIS — W540XXA Bitten by dog, initial encounter: Secondary | ICD-10-CM

## 2011-09-08 DIAGNOSIS — M79643 Pain in unspecified hand: Secondary | ICD-10-CM

## 2011-09-08 DIAGNOSIS — T148XXA Other injury of unspecified body region, initial encounter: Secondary | ICD-10-CM

## 2011-09-08 MED ORDER — AMOXICILLIN-POT CLAVULANATE 875-125 MG PO TABS: 1.0000 | ORAL_TABLET | Freq: Two times a day (BID) | ORAL | Status: AC

## 2011-09-08 MED ORDER — TRAMADOL HCL 50 MG PO TABS: 50.0000 mg | ORAL_TABLET | Freq: Three times a day (TID) | ORAL | Status: AC | PRN

## 2011-09-08 NOTE — Progress Notes (Signed)
   Patient ID: Ronald House MRN: [id number], DOB: [birth year], 66 y.o. Date of Encounter: 09/08/2011, 7:33 PM   PROCEDURE NOTE: Verbal consent obtained. Sterile technique employed. Numbing: Anesthesia obtained with 2% plain lidocaine.  Cleansed with soap and water. Irrigated. Betadine prep per usual protocol.  Wound explored, no deep structures involved, no foreign bodies.   Wound repaired with # 4 simple interrupted sutures Hemostasis obtained. Wound cleansed and dressed.  Wound care instructions including precautions covered with patient. Handout given.  Anticipate suture removal in 10 days  Skin tear along dorsum of the left hand washed with soap and water. Dermabond applied.  Puncture wound left elbow washed.  Bilateral knee abrasions washed.  SignedEula Listen, PA-C 09/08/2011 7:33 PM
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
## 2011-09-18 ENCOUNTER — Ambulatory Visit (INDEPENDENT_AMBULATORY_CARE_PROVIDER_SITE_OTHER): Payer: BC Managed Care – PPO | Admitting: Physician Assistant

## 2011-09-18 VITALS — BP 114/74 | HR 65 | Temp 98.3°F | Resp 16 | Ht 69.5 in | Wt 176.0 lb

## 2011-09-18 DIAGNOSIS — S61409A Unspecified open wound of unspecified hand, initial encounter: Secondary | ICD-10-CM
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
## 2012-10-13 ENCOUNTER — Encounter: Payer: Self-pay | Admitting: Gastroenterology

## 2012-10-18 ENCOUNTER — Encounter: Payer: Self-pay | Admitting: Gastroenterology

## 2012-12-10 DIAGNOSIS — M810 Age-related osteoporosis without current pathological fracture: Secondary | ICD-10-CM | POA: Diagnosis not present

## 2013-01-13 ENCOUNTER — Observation Stay (HOSPITAL_COMMUNITY)
Admission: EM | Admit: 2013-01-13 | Discharge: 2013-01-14 | Disposition: A | Discharge status: Home / Self Care | Payer: BC Managed Care – PPO | Attending: Internal Medicine | Admitting: Internal Medicine

## 2013-01-13 ENCOUNTER — Encounter (HOSPITAL_COMMUNITY): Payer: Self-pay | Admitting: Emergency Medicine

## 2013-01-13 ENCOUNTER — Encounter: Payer: BC Managed Care – PPO | Admitting: Gastroenterology

## 2013-01-13 ENCOUNTER — Emergency Department (HOSPITAL_COMMUNITY): Payer: BC Managed Care – PPO

## 2013-01-13 DIAGNOSIS — R479 Unspecified speech disturbances: Secondary | ICD-10-CM

## 2013-01-13 DIAGNOSIS — R4789 Other speech disturbances: Secondary | ICD-10-CM | POA: Insufficient documentation

## 2013-01-13 DIAGNOSIS — G459 Transient cerebral ischemic attack, unspecified: Secondary | ICD-10-CM | POA: Diagnosis not present

## 2013-01-13 DIAGNOSIS — R209 Unspecified disturbances of skin sensation: Secondary | ICD-10-CM | POA: Insufficient documentation

## 2013-01-13 DIAGNOSIS — F40298 Other specified phobia: Secondary | ICD-10-CM | POA: Insufficient documentation

## 2013-01-13 DIAGNOSIS — C61 Malignant neoplasm of prostate: Secondary | ICD-10-CM | POA: Diagnosis not present

## 2013-01-13 DIAGNOSIS — Z8546 Personal history of malignant neoplasm of prostate: Secondary | ICD-10-CM | POA: Insufficient documentation

## 2013-01-13 LAB — COMPREHENSIVE METABOLIC PANEL
ALT: 18 U/L (ref 0–53)
AST: 24 U/L (ref 0–37)
Albumin: 3.9 g/dL (ref 3.5–5.2)
Calcium: 9.1 mg/dL (ref 8.4–10.5)
Creatinine, Ser: 0.96 mg/dL (ref 0.50–1.35)
Potassium: 4.1 mEq/L (ref 3.5–5.1)
Sodium: 137 mEq/L (ref 135–145)
Total Bilirubin: 0.4 mg/dL (ref 0.3–1.2)
Total Protein: 6.9 g/dL (ref 6.0–8.3)

## 2013-01-13 LAB — POCT I-STAT TROPONIN I: Troponin i, poc: 0 ng/mL (ref 0.00–0.08)

## 2013-01-13 LAB — DIFFERENTIAL
Basophils Absolute: 0.1 10*3/uL (ref 0.0–0.1)
Basophils Relative: 1 % (ref 0–1)
Eosinophils Absolute: 0.1 10*3/uL (ref 0.0–0.7)
Eosinophils Relative: 3 % (ref 0–5)
Lymphocytes Relative: 33 % (ref 12–46)

## 2013-01-13 LAB — CBC
MCH: 30.4 pg (ref 26.0–34.0)
MCHC: 33.6 g/dL (ref 30.0–36.0)
MCV: 90.6 fL (ref 78.0–100.0)
Platelets: 186 10*3/uL (ref 150–400)
RDW: 12.9 % (ref 11.5–15.5)
WBC: 5.7 10*3/uL (ref 4.0–10.5)

## 2013-01-13 LAB — GLUCOSE, CAPILLARY: Glucose-Capillary: 100 mg/dL — ABNORMAL HIGH (ref 70–99)

## 2013-01-13 LAB — PROTIME-INR: Prothrombin Time: 13.8 seconds (ref 11.6–15.2)

## 2013-01-13 MED ORDER — SODIUM CHLORIDE 0.9 % IJ SOLN: 3.0000 mL | INTRAMUSCULAR | Status: DC | PRN

## 2013-01-13 MED ORDER — ENOXAPARIN SODIUM 40 MG/0.4ML ~~LOC~~ SOLN
40.0000 mg | SUBCUTANEOUS | Status: DC
  Administered 2013-01-13: 40 mg via SUBCUTANEOUS
  Filled 2013-01-13 (×2): qty 0.4

## 2013-01-13 MED ORDER — SODIUM CHLORIDE 0.9 % IV SOLN: 250.0000 mL | INTRAVENOUS | Status: DC | PRN

## 2013-01-13 MED ORDER — ALFUZOSIN HCL ER 10 MG PO TB24
10.0000 mg | ORAL_TABLET | Freq: Every evening | ORAL | Status: DC
  Administered 2013-01-14: 10 mg via ORAL
  Filled 2013-01-13 (×3): qty 1

## 2013-01-13 MED ORDER — SODIUM CHLORIDE 0.9 % IJ SOLN
3.0000 mL | Freq: Two times a day (BID) | INTRAMUSCULAR | Status: DC
  Administered 2013-01-13: 3 mL via INTRAVENOUS

## 2013-01-13 NOTE — ED Notes (Signed)
Assumed patient care.  Pt alert and oriented x 4.  Denies any lingering issues that prompted his ER visit today.  Voiced concern re: heart rate <60bpm.  Checking back on todays vitals, pt HR has been <60 since arrival. Pt denies SOB, dizziness, near syncope, etc.  Denies pain.

## 2013-01-13 NOTE — ED Notes (Signed)
Patient transported to CT 

## 2013-01-13 NOTE — ED Notes (Signed)
Pt reports onset 0900, he tried to speak and it came out jumbled. Pt reports it only occurred once and resolved. Then reports noticiing numbness to left little finger today around noon, noticed that vision seems "darker" around 2-3pm. Grips are equal, speech clear, no facial droop, no arm drift noted.
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
## 2013-01-13 NOTE — ED Notes (Signed)
Report to [facility] on 4N.

## 2013-01-13 NOTE — ED Notes (Signed)
MD at bedside. 

## 2013-01-13 NOTE — Consult Note (Signed)
Referring Physician: Dr. Rubin Payor    Chief Complaint: Transient speech difficulty.  HPI: Ronald House is an 67 y.o. male with no known risk factors for stroke who experienced an episode of speech abnormality at 9 AM this morning. Speech was unintelligible. Symptoms only last a few seconds. Throughout the day he has had recurrent episodes of difficulty initiating speech, with hesitancy and having difficulty getting out what he wants to say. He has not experienced focal weakness nor numbness except for feeling of tightness around his mouth. CT scan of his head showed no acute intracranial abnormality. There is no history of stroke nor TIA. Patient has not been on antiplatelet therapy. NIH stroke score at this point is 0.  LSN: 9 AM on 01/13/2013 tPA Given: No: Symptoms rapidly resolved mRankin: Space 0  Past Medical History  Diagnosis Date  . Peyronie disease   . Prostate cancer   . Arthritis   . Undiagnosed cardiac murmurs     childhood/unknown if present issue  . Bulging lumbar disc   . Hematuria   . DDD (degenerative disc disease), lumbar     Family History  Problem Relation Age of Onset  . Benign prostatic hyperplasia Father   . Cancer Father     throat  . Heart disease Brother      Medications: I have reviewed the patient's current medications.  ROS: History obtained from the patient  General ROS: negative for - chills, fatigue, fever, night sweats, weight gain or weight loss Psychological ROS: negative for - behavioral disorder, hallucinations, memory difficulties, mood swings or suicidal ideation Ophthalmic ROS: negative for - blurry vision, double vision, eye pain or loss of vision ENT ROS: negative for - epistaxis, nasal discharge, oral lesions, sore throat, tinnitus or vertigo Allergy and Immunology ROS: negative for - hives or itchy/watery eyes Hematological and Lymphatic ROS: negative for - bleeding problems, bruising or swollen lymph nodes Endocrine ROS:  negative for - galactorrhea, hair pattern changes, polydipsia/polyuria or temperature intolerance Respiratory ROS: negative for - cough, hemoptysis, shortness of breath or wheezing Cardiovascular ROS: negative for - chest pain, dyspnea on exertion, edema or irregular heartbeat Gastrointestinal ROS: negative for - abdominal pain, diarrhea, hematemesis, nausea/vomiting or stool incontinence Genito-Urinary ROS: negative for - dysuria, hematuria, incontinence or urinary frequency/urgency Musculoskeletal ROS: negative for - joint swelling or muscular weakness Neurological ROS: as noted in HPI Dermatological ROS: negative for rash and skin lesion changes  Physical Examination: Blood pressure 151/81, pulse 45, temperature 97.7 F (36.5 C), temperature source Oral, resp. rate 10, SpO2 98.00%.  Neurologic Examination: Mental Status: Alert, oriented, thought content appropriate.  Speech fluent without evidence of aphasia. Able to follow commands without difficulty. Cranial Nerves: II-Visual fields were normal. III/IV/VI-Pupils were equal and reacted. Extraocular movements were full and conjugate.    V/VII-no facial numbness and no facial weakness. VIII-normal. X-normal speech and symmetrical palatal movement. Motor: 5/5 bilaterally with normal tone and bulk Sensory: Normal throughout. Deep Tendon Reflexes: 2+ and symmetric. Plantars: Flexor bilaterally Cerebellar: Normal finger-to-nose testing. Carotid auscultation: Normal  Ct Head Wo Contrast  01/13/2013   CLINICAL DATA:  Aphasia, numbness, transient ischemic attack  EXAM: CT HEAD WITHOUT CONTRAST  TECHNIQUE: Contiguous axial images were obtained from the base of the skull through the vertex without intravenous contrast.  COMPARISON:  None.  FINDINGS: Negative for acute intracranial hemorrhage, acute infarction, mass, mass effect, hydrocephalus or midline shift. Gray-white differentiation is preserved throughout. No acute soft tissue or  calvarial abnormality. Globes and orbits  are intact and symmetric bilaterally. Normal aeration of the paranasal sinuses and mastoid air cells.  IMPRESSION: Negative head CT.   Electronically Signed   By: Malachy Moan M.D.   On: 01/13/2013 18:49    Assessment: 67 y.o. male presenting with a history of transient speech difficulty upon clear etiology. TIA cannot be ruled out. As well small subcortical ischemic cerebral infarction cannot be ruled out.  Stroke Risk Factors - none  Plan: 1. HgbA1c, fasting lipid panel 2. MRI, MRA  of the brain without contrast 3. Speech consult 4. Echocardiogram 5. Carotid dopplers 6. Prophylactic therapy-Antiplatelet med: Aspirin 325 mg per day 7. Risk factor modification 8. Telemetry monitoring  C.R. Roseanne Reno, MD [facility] [phone]  01/13/2013, 7:53 PM

## 2013-01-14 ENCOUNTER — Observation Stay (HOSPITAL_COMMUNITY): Payer: BC Managed Care – PPO

## 2013-01-14 DIAGNOSIS — G459 Transient cerebral ischemic attack, unspecified: Secondary | ICD-10-CM | POA: Diagnosis not present

## 2013-01-14 DIAGNOSIS — R4789 Other speech disturbances: Secondary | ICD-10-CM

## 2013-01-14 DIAGNOSIS — C61 Malignant neoplasm of prostate: Secondary | ICD-10-CM | POA: Diagnosis not present

## 2013-01-14 DIAGNOSIS — I517 Cardiomegaly: Secondary | ICD-10-CM

## 2013-01-14 LAB — GLUCOSE, CAPILLARY: Glucose-Capillary: 87 mg/dL (ref 70–99)

## 2013-01-14 LAB — CBC WITH DIFFERENTIAL/PLATELET
Basophils Relative: 0 % (ref 0–1)
Hemoglobin: 14 g/dL (ref 13.0–17.0)
Lymphocytes Relative: 42 % (ref 12–46)
Lymphs Abs: 2 10*3/uL (ref 0.7–4.0)
Monocytes Relative: 9 % (ref 3–12)
Neutro Abs: 2.2 10*3/uL (ref 1.7–7.7)
Neutrophils Relative %: 46 % (ref 43–77)
Platelets: 166 10*3/uL (ref 150–400)
RBC: 4.69 MIL/uL (ref 4.22–5.81)
WBC: 4.8 10*3/uL (ref 4.0–10.5)

## 2013-01-14 LAB — COMPREHENSIVE METABOLIC PANEL
AST: 18 U/L (ref 0–37)
Albumin: 3.4 g/dL — ABNORMAL LOW (ref 3.5–5.2)
Alkaline Phosphatase: 67 U/L (ref 39–117)
BUN: 18 mg/dL (ref 6–23)
CO2: 29 mEq/L (ref 19–32)
Chloride: 105 mEq/L (ref 96–112)
GFR calc non Af Amer: 68 mL/min — ABNORMAL LOW (ref >90)
Glucose, Bld: 96 mg/dL (ref 70–99)
Potassium: 4.8 mEq/L (ref 3.5–5.1)
Sodium: 140 mEq/L (ref 135–145)
Total Bilirubin: 0.4 mg/dL (ref 0.3–1.2)

## 2013-01-14 LAB — HEMOGLOBIN A1C
Hgb A1c MFr Bld: 5.7 % — ABNORMAL HIGH (ref <5.7)
Mean Plasma Glucose: 117 mg/dL — ABNORMAL HIGH (ref <117)

## 2013-01-14 LAB — LIPID PANEL
HDL: 59 mg/dL (ref >39)
LDL Cholesterol: 98 mg/dL (ref 0–99)
VLDL: 14 mg/dL (ref 0–40)

## 2013-01-14 MED ORDER — ASPIRIN EC 81 MG PO TBEC
81.0000 mg | DELAYED_RELEASE_TABLET | Freq: Every day | ORAL | Status: DC
  Administered 2013-01-14: 81 mg via ORAL
  Filled 2013-01-14: qty 1

## 2013-01-14 MED ORDER — ASPIRIN 81 MG PO TBEC: 81.0000 mg | DELAYED_RELEASE_TABLET | Freq: Every day | ORAL | Status: AC

## 2013-01-14 NOTE — Progress Notes (Signed)
  Echocardiogram 2D Echocardiogram has been performed by Nolon Rod.  Yazir Koerber FRANCES 01/14/2013, 10:31 AM

## 2013-01-14 NOTE — Progress Notes (Signed)
UR complete.  Tylique Aull RN, MSN 

## 2013-01-14 NOTE — Progress Notes (Signed)
Stroke Team Progress Note  HISTORY Ronald House is an 67 y.o. male with no known risk factors for stroke who experienced an episode of speech abnormality at 9 AM this morning. Speech was unintelligible. Symptoms only last a few seconds. Throughout the day he has had recurrent episodes of difficulty initiating speech, with hesitancy and having difficulty getting out what he wants to say. He has not experienced focal weakness nor numbness except for feeling of tightness around his mouth. CT scan of his head showed no acute intracranial abnormality. There is no history of stroke nor TIA. Patient has not been on antiplatelet therapy. NIH stroke score at this point is 0.   LSN: 9 AM on 01/13/2013  tPA Given: No: Symptoms rapidly resolved  mRankin: 0   He was admitted for further evaluation and treatment.  SUBJECTIVE He is lying in the bed. Overall he feels his condition is completely resolved.   OBJECTIVE Most recent Vital Signs: Filed Vitals:   01/13/13 2155 01/14/13 0157 01/14/13 0521 01/14/13 0920  BP: 146/74 119/70 125/69 132/69  Pulse: 50 85 45 51  Temp: 98.7 F (37.1 C) 98.1 F (36.7 C) 97.7 F (36.5 C) 98.1 F (36.7 C)  TempSrc: Oral Oral Oral Oral  Resp: 18 18 16 17   Height:      Weight:      SpO2: 97% 96% 94% 96%   CBG (last 3)   Recent Labs  01/13/13 1743 01/13/13 2222 01/14/13 0700  GLUCAP 100* 157* 87    IV Fluid Intake:     MEDICATIONS  . alfuzosin  10 mg Oral QPM  . aspirin EC  81 mg Oral Daily  . enoxaparin (LOVENOX) injection  40 mg Subcutaneous Q24H  . sodium chloride  3 mL Intravenous Q12H   PRN:  sodium chloride, sodium chloride  Diet:  Cardiac thin liquids Activity:   Bathroom privileges with assistance DVT Prophylaxis:  lovenox  CLINICALLY SIGNIFICANT STUDIES Basic Metabolic Panel:  Recent Labs Lab 01/13/13 1657 01/14/13 0618  NA 137 140  K 4.1 4.8  CL 103 105  CO2 25 29  GLUCOSE 103* 96  BUN 20 18  CREATININE 0.96 1.10  CALCIUM 9.1  8.9   Liver Function Tests:  Recent Labs Lab 01/13/13 1657 01/14/13 0618  AST 24 18  ALT 18 14  ALKPHOS 76 67  BILITOT 0.4 0.4  PROT 6.9 6.2  ALBUMIN 3.9 3.4*   CBC:  Recent Labs Lab 01/13/13 1657 01/14/13 0618  WBC 5.7 4.8  NEUTROABS 3.2 2.2  HGB 14.5 14.0  HCT 43.2 42.5  MCV 90.6 90.6  PLT 186 166   Coagulation:  Recent Labs Lab 01/13/13 1657  LABPROT 13.8  INR 1.08   Cardiac Enzymes:  Recent Labs Lab 01/13/13 1658  TROPONINI <0.30   Urinalysis: No results found for this basename: COLORURINE, APPERANCEUR, LABSPEC, PHURINE, GLUCOSEU, HGBUR, BILIRUBINUR, KETONESUR, PROTEINUR, UROBILINOGEN, NITRITE, LEUKOCYTESUR,  in the last 168 hours Lipid Panel    Component Value Date/Time   CHOL 171 01/14/2013 0435   TRIG 70 01/14/2013 0435   HDL 59 01/14/2013 0435   CHOLHDL 2.9 01/14/2013 0435   VLDL 14 01/14/2013 0435   LDLCALC 98 01/14/2013 0435   HgbA1C  No results found for this basename: HGBA1C    Urine Drug Screen:   No results found for this basename: labopia, cocainscrnur, labbenz, amphetmu, thcu, labbarb    Alcohol Level: No results found for this basename: ETH,  in the last 168 hours  Ct  Head Wo Contrast 01/13/2013    Negative head CT.   MRI of the brain    MRA of the brain    2D Echocardiogram  EF 60%, wall motion normal, LA normal size.  Carotid Doppler  Carotid Duplex (Doppler) has been completed. Preliminary findings: Bilateral: 1-39% ICA stenosis. Vertebral artery flow is antegrade  CXR    EKG  sinus bradycardia.   Therapy Recommendations   Physical Exam   Mental Status:  Alert, oriented, thought content appropriate. Speech fluent without evidence of aphasia. Able to follow commands without difficulty.  Cranial Nerves:  II-Visual fields were normal.  III/IV/VI-Pupils were equal and reacted. Extraocular movements were full and conjugate.  V/VII-no facial numbness and no facial weakness.  VIII-normal.  X-normal speech and symmetrical  palatal movement.  Motor: 5/5 bilaterally with normal tone and bulk  Sensory: Normal throughout.  Deep Tendon Reflexes: 2+ and symmetric.  Plantars: Flexor bilaterally  Cerebellar: Normal finger-to-nose testing.  Carotid auscultation: Normal   ASSESSMENT [name] is a 67 y.o. male presenting with multifocal transient symptoms unlikley to be TIA given different vascular distributions. CT Imaging does not reveal acute infarct. Symptoms consistent with transient ischemic attack.  On no antithrombotics prior to admission. Now on aspirin 81 mg orally every day for secondary stroke prevention. Patient with resultant transient symptoms.   LDL 98, at goal, no statin indicated  Hospital day # 1  TREATMENT/PLAN  Continue aspirin 81 mg orally every day for secondary stroke prevention.  Ambulate with assist  Plan on open MRI brain due to patients claustrophobia  Follow up with Dr. Pearlean Brownie after MRI. If not set up at discharge, have patient contact office to set up.  Gwendolyn Lima. Manson Passey, [facility], MBA, MHA [facility] Pager: [phone] 01/14/2013 11:23 AM  I have personally obtained a history, examined the patient, evaluated imaging results, and formulated the assessment and plan of care. I agree with the above. Delia Heady, MD

## 2013-01-14 NOTE — ED Provider Notes (Signed)
CSN: [id number]     Arrival date & time 01/13/13  1659 History   First MD Initiated Contact with Patient 01/13/13 1711     Chief Complaint  Patient presents with  . Aphasia  . Numbness   (Consider location/radiation/quality/duration/timing/severity/associated sxs/prior Treatment) HPI Patient is a 67 year old Caucasian male no past medical history who comes emergency department today with aphasia and left hand numbness. The fascial occurred approximately 10:00 this morning. He was driving his car talking on the phone when he attempted to stay phrase. In his head worse or right however the phrase came out garbled.  Resolved completely after approximately 30 seconds. Later in the day he developed numbness to the fifth digit of his left hand lasted for approximately 10 seconds. Result he became concerned and came to the emergency department. He never had a CVA. No family history of CVA. He hasn't noticed.  Past Medical History  Diagnosis Date  . Peyronie disease   . Prostate cancer   . Arthritis   . Undiagnosed cardiac murmurs     childhood/unknown if present issue  . Bulging lumbar disc   . Hematuria   . DDD (degenerative disc disease), lumbar    Past Surgical History  Procedure Laterality Date  . History of facial surgery  25 yrs ago    related to fracture  . History of hand repair  20 yrs ago    broken right index finger  . Radioactive seed implant  05/22/2011    Procedure: RADIOACTIVE SEED IMPLANT;  Surgeon: Kathi Ludwig, MD;  Location: [facility];  Service: Urology;  Laterality: N/A;  RAD TECH OK PER [facility]    Family History  Problem Relation Age of Onset  . Benign prostatic hyperplasia Father   . Cancer Father     throat  . Heart disease Brother    History  Substance Use Topics  . Smoking status: Never Smoker   . Smokeless tobacco: Never Used  . Alcohol Use: 3.0 oz/week    5 Glasses of wine per week    Review of Systems  Constitutional:  Negative for fever and chills.  Respiratory: Negative for cough and shortness of breath.   Gastrointestinal: Negative for nausea, vomiting, diarrhea and constipation.  Genitourinary: Negative for dysuria, urgency and frequency.  Neurological: Positive for speech difficulty and numbness.  All other systems reviewed and are negative.    Allergies  Review of patient's allergies indicates no known allergies.  Home Medications  No current outpatient prescriptions on file. BP 146/74  Pulse 50  Temp(Src) 98.7 F (37.1 C) (Oral)  Resp 18  Ht 5\' 10"  (1.778 m)  Wt 178 lb 12.8 oz (81.103 kg)  BMI 25.66 kg/m2  SpO2 97% Physical Exam  Nursing note and vitals reviewed. Constitutional: He is oriented to person, place, and time. He appears well-developed and well-nourished. No distress.  HENT:  Head: Normocephalic and atraumatic.  Eyes: Pupils are equal, round, and reactive to light.  Neck: Normal range of motion.  Cardiovascular: Normal rate, regular rhythm and normal heart sounds.   Pulmonary/Chest: Effort normal and breath sounds normal. No respiratory distress. He has no decreased breath sounds. He has no wheezes. He has no rhonchi. He has no rales.  Abdominal: Soft. He exhibits no distension. There is no tenderness. There is no rebound and no guarding.  Musculoskeletal: He exhibits no edema and no tenderness.  Neurological: He is alert and oriented to person, place, and time. No cranial nerve deficit  or sensory deficit. He exhibits normal muscle tone. Coordination and gait normal.  Sensation intact x4 extremities.  Strength 5/5 to bilateral upper and lower extremities.  No speech difficulty, no visual field defects, no dysdiadochokinesia.    Skin: Skin is warm and dry.    ED Course  Procedures (including critical care time) Labs Review Labs Reviewed  COMPREHENSIVE METABOLIC PANEL - Abnormal; Notable for the following:    Glucose, Bld 103 (*)    GFR calc non Af Amer 84 (*)    All  other components within normal limits  GLUCOSE, CAPILLARY - Abnormal; Notable for the following:    Glucose-Capillary 100 (*)    All other components within normal limits  GLUCOSE, CAPILLARY - Abnormal; Notable for the following:    Glucose-Capillary 157 (*)    All other components within normal limits  PROTIME-INR  APTT  CBC  DIFFERENTIAL  TROPONIN I  HEMOGLOBIN A1C  LIPID PANEL  POCT I-STAT TROPONIN I   Imaging Review Ct Head Wo Contrast  01/13/2013   CLINICAL DATA:  Aphasia, numbness, transient ischemic attack  EXAM: CT HEAD WITHOUT CONTRAST  TECHNIQUE: Contiguous axial images were obtained from the base of the skull through the vertex without intravenous contrast.  COMPARISON:  None.  FINDINGS: Negative for acute intracranial hemorrhage, acute infarction, mass, mass effect, hydrocephalus or midline shift. Gray-white differentiation is preserved throughout. No acute soft tissue or calvarial abnormality. Globes and orbits are intact and symmetric bilaterally. Normal aeration of the paranasal sinuses and mastoid air cells.  IMPRESSION: Negative head CT.   Electronically Signed   By: Malachy Moan M.D.   On: 01/13/2013 18:49    EKG Interpretation     Ventricular Rate:    PR Interval:    QRS Duration:   QT Interval:    QTC Calculation:   R Axis:     Text Interpretation:              MDM  Patient is 80 -year-old Caucasian male with no past medical history who comes emergency department today with aphasia. Physical exam as above. This is concerning for TIA. With no neurologic deficits at this time doubt a CVA. Initial workup included a troponin, glucose, INR, and PTT, CBC, CMP, EKG, CT of head. Results of the above workup unremarkable. However the history of aphasia is concerning for TIA. Patient was felt to require admission to the hospital for TIA evaluation. Patient was discussed with the neurology service. They agreed with the plan. The patient was admitted to the  hospitalist service in stable condition. Labs and imaging reviewed by myself and considered and medical decision-making. Imaging was interpreted by radiology. Care was discussed with my attending Dr. Rubin Payor.    1. TIA (transient ischemic attack)       Bethann Berkshire, MD 01/14/13 [phone]

## 2013-01-14 NOTE — Discharge Summary (Signed)
Physician Discharge Summary  Ronald House:[id number] DOB: 09-01-1945 DOA: 01/13/2013  PCP: Billee Cashing, MD  Admit date: 01/13/2013 Discharge date: 01/14/2013  Time spent: Less than 30 minutes  Recommendations for Outpatient Follow-up:  1. Dr. Billee Cashing, PCP in 1 week. 2. Dr. Delia Heady, Neurology: Call on 01/17/2013 for outpatient stroke workup and followup. 3. Followup hemoglobin A1c results as outpatient.  Discharge Diagnoses:  Principal Problem:   TIA (transient ischemic attack)   Discharge Condition: Improved & Stable  Diet recommendation: Heart Healthy diet.  Filed Weights   01/13/13 2025  Weight: 81.103 kg (178 lb 12.8 oz)    History of present illness:  66 year old male patient with history of prostate cancer, was admitted on 01/13/13 with transient speech difficulty and left little finger numbness which resolved spontaneously. He was admitted for further evaluation for possible TIA.  Hospital Course:   Transient speech difficulty and left little finger numbness - Patient states that at approximately 9 AM on 01/13/13 while he was driving and speaking with somebody on the phone, he noticed abrupt onset of speech difficulty. He had trouble getting out words. This was not associated with any other symptoms such as slurred speech, facial twisting, headache, dizziness, chest pain, palpitations or extremity numbness or weakness. This resolved spontaneously within a few seconds. Subsequently at about noontime, while he was at cardioversion, he experienced transient left little finger numbness without weakness and similar speech difficulties as earlier which took longer time to resolve. He presented to the ED by which time his symptoms had resolved. He was admitted for evaluation of TIA. Patient denied any recurrence of symptoms this morning. He was planned for MRI/MRA brain and rest of stroke workup. Patient was seen by stroke M.D. Dr. Pearlean Brownie. Patient expressed  that he was claustrophobic and would not able to complete the closed hospital MRI. Dr. Pearlean Brownie was not convinced of TIA/stroke and has cleared him for discharge with outpatient followup with him. We will arrange outpatient open MRI. We has advised aspirin 81 mg daily. Patient has been counseled to seek immediate medical attention if he has any further strokelike symptoms. He verbalizes understanding.  History of prostate cancer  Consultations:  Neurology  Procedures:  None    Discharge Exam:  Complaints:  Denies complaints. Eager to go home.  Filed Vitals:   01/14/13 0157 01/14/13 0521 01/14/13 0920 01/14/13 1245  BP: 119/70 125/69 132/69 135/69  Pulse: 85 45 51 54  Temp: 98.1 F (36.7 C) 97.7 F (36.5 C) 98.1 F (36.7 C) 97.8 F (36.6 C)  TempSrc: Oral Oral Oral Oral  Resp: 18 16 17 16   Height:      Weight:      SpO2: 96% 94% 96% 97%    General exam: Moderately built and nourished male patient lying comfortably supine in bed. Respiratory system: Clear. No increased work of breathing. Cardiovascular system: S1 & S2 heard, RRR. No JVD, murmurs, gallops, clicks or pedal edema. Telemetry: Sinus bradycardia mostly in the 50s, sometimes in the 40s (asymptomatic-likely physiologic and these were probably during sleep) Gastrointestinal system: Abdomen is nondistended, soft and nontender. Normal bowel sounds heard. Central nervous system: Alert and oriented. No focal neurological deficits. Extremities: Symmetric 5 x 5 power.  Discharge Instructions      Discharge Orders   Future Orders Complete By Expires   Activity as tolerated - No restrictions  As directed    Call MD for:  As directed    Comments:     Strokelike  symptoms.   Diet - low sodium heart healthy  As directed        Medication List         alfuzosin 10 MG 24 hr tablet  Commonly known as:  UROXATRAL  Take 10 mg by mouth every evening.     aspirin 81 MG EC tablet  Take 1 tablet (81 mg total) by mouth  daily.     calcium carbonate 600 MG Tabs tablet  Commonly known as:  OS-CAL  Take 600 mg by mouth daily.     cholecalciferol 1000 UNITS tablet  Commonly known as:  VITAMIN D  Take 1,000 Units by mouth daily.     fish oil-omega-3 fatty acids 1000 MG capsule  Take 2 g by mouth daily.     multivitamin tablet  Take 1 tablet by mouth daily.       Follow-up Information   Follow up with Billee Cashing, MD. Schedule an appointment as soon as possible for a visit in 1 week.   Specialty:  Family Medicine   Contact information:   [address] [place] [address] [phone]       Follow up with Gates Rigg, MD. (Call on 01/17/2013 for appointment and out patient stroke work up (MRI).)    Specialties:  Neurology, Radiology   Contact information:   [address] [place] [address] [phone]        The results of significant diagnostics from this hospitalization (including imaging, microbiology, ancillary and laboratory) are listed below for reference.    Significant Diagnostic Studies: Ct Head Wo Contrast  01/13/2013   CLINICAL DATA:  Aphasia, numbness, transient ischemic attack  EXAM: CT HEAD WITHOUT CONTRAST  TECHNIQUE: Contiguous axial images were obtained from the base of the skull through the vertex without intravenous contrast.  COMPARISON:  None.  FINDINGS: Negative for acute intracranial hemorrhage, acute infarction, mass, mass effect, hydrocephalus or midline shift. Gray-white differentiation is preserved throughout. No acute soft tissue or calvarial abnormality. Globes and orbits are intact and symmetric bilaterally. Normal aeration of the paranasal sinuses and mastoid air cells.  IMPRESSION: Negative head CT.   Electronically Signed   By: Malachy Moan M.D.   On: 01/13/2013 18:49    Microbiology: No results found for this or any previous visit (from the past 240 hour(s)).   Labs: Basic Metabolic Panel:  Recent Labs Lab  01/13/13 1657 01/14/13 0618  NA 137 140  K 4.1 4.8  CL 103 105  CO2 25 29  GLUCOSE 103* 96  BUN 20 18  CREATININE 0.96 1.10  CALCIUM 9.1 8.9   Liver Function Tests:  Recent Labs Lab 01/13/13 1657 01/14/13 0618  AST 24 18  ALT 18 14  ALKPHOS 76 67  BILITOT 0.4 0.4  PROT 6.9 6.2  ALBUMIN 3.9 3.4*   No results found for this basename: LIPASE, AMYLASE,  in the last 168 hours No results found for this basename: AMMONIA,  in the last 168 hours CBC:  Recent Labs Lab 01/13/13 1657 01/14/13 0618  WBC 5.7 4.8  NEUTROABS 3.2 2.2  HGB 14.5 14.0  HCT 43.2 42.5  MCV 90.6 90.6  PLT 186 166   Cardiac Enzymes:  Recent Labs Lab 01/13/13 1658  TROPONINI <0.30   BNP: BNP (last 3 results) No results found for this basename: PROBNP,  in the last 8760 hours CBG:  Recent Labs Lab 01/13/13 1743 01/13/13 2222 01/14/13 0700 01/14/13 1156  GLUCAP 100* 157* 87  96    Additional labs: 1. Fasting lipids: Cholesterol 171, triglycerides 70, HDL 59, LDL 98, VLDL 14 2. 2-D echo 01/14/13: Study Conclusions  - Left ventricle: The cavity size was normal. Wall thickness was increased in a pattern of mild LVH. Systolic function was normal. The estimated ejection fraction was in the range of 60% to 65%. Wall motion was normal; there were no regional wall motion abnormalities. Doppler parameters are consistent with abnormal left ventricular relaxation (grade 1 diastolic dysfunction). - Right atrium: Central venous pressure: 3mm Hg (est). - Atrial septum: No defect or patent foramen ovale was identified. - Tricuspid valve: Physiologic regurgitation. - Pulmonary arteries: Systolic pressure could not be accurately estimated. - Pericardium, extracardiac: There was no pericardial effusion.  Impressions:  - No prior study for comparison. Mild LVH with LVEF 60-65%, grade 1 diastolic dysfunction. Unable to assess PASP. No obvious PFO or ASD.  3. Carotid Doppler: Preliminary report Vascular  Ultrasound  Carotid Duplex (Doppler) has been completed. Preliminary findings: Bilateral: 1-39% ICA stenosis. Vertebral artery flow is antegrade.       Signed:  Marcellus Scott, MD, FACP, FHM. [facility] Pager [phone]  If 7PM-7AM, please contact night-coverage [email or web address] Password [email or web address] 01/14/2013, 3:11 PM

## 2013-01-14 NOTE — Progress Notes (Signed)
Patient d/ced home. Assessments remain unchanged as at now.

## 2013-01-14 NOTE — Progress Notes (Signed)
*  PRELIMINARY RESULTS* Vascular Ultrasound Carotid Duplex (Doppler) has been completed.  Preliminary findings: Bilateral:  1-39% ICA stenosis.  Vertebral artery flow is antegrade.      Farrel Demark, RDMS, RVT  01/14/2013, 10:45 AM

## 2013-01-15 NOTE — ED Provider Notes (Signed)
I saw and evaluated the patient, reviewed the resident's note and I agree with the findings and plan.  EKG Interpretation     Ventricular Rate:  48 PR Interval:  136 QRS Duration: 88 QT Interval:  440 QTC Calculation: 393 R Axis:   61 Text Interpretation:  Sinus bradycardia    No significant change since last tracing           Patient with difficulty speaking earlier. Symptoms resolved. Will admit for TIA workup.  Juliet Rude. Rubin Payor, MD 01/15/13 [phone]

## 2013-01-19 ENCOUNTER — Other Ambulatory Visit: Payer: Self-pay | Admitting: Neurology

## 2013-01-19 DIAGNOSIS — G459 Transient cerebral ischemic attack, unspecified: Secondary | ICD-10-CM

## 2013-01-20 ENCOUNTER — Other Ambulatory Visit: Payer: Self-pay

## 2013-01-21 ENCOUNTER — Encounter: Payer: Self-pay | Admitting: Gastroenterology

## 2013-01-30 ENCOUNTER — Ambulatory Visit
Admission: RE | Admit: 2013-01-30 | Discharge: 2013-01-30 | Disposition: A | Discharge status: Home / Self Care | Payer: Medicare Other | Source: Ambulatory Visit | Attending: Neurology | Admitting: Neurology

## 2013-01-30 DIAGNOSIS — G459 Transient cerebral ischemic attack, unspecified: Secondary | ICD-10-CM

## 2013-01-30 DIAGNOSIS — R51 Headache: Secondary | ICD-10-CM | POA: Diagnosis not present

## 2013-02-01 ENCOUNTER — Telehealth: Payer: Self-pay | Admitting: Neurology

## 2013-02-02 NOTE — Telephone Encounter (Signed)
Please advise 

## 2013-02-03 NOTE — Telephone Encounter (Signed)
Call patient and left message for him to call back to make an appointment to come in to see Larita Fife, NP for his MRI results, per Dr. Pearlean Brownie.

## 2013-02-03 NOTE — Telephone Encounter (Signed)
Ok to make appointment

## 2013-02-24 ENCOUNTER — Ambulatory Visit (INDEPENDENT_AMBULATORY_CARE_PROVIDER_SITE_OTHER): Payer: Medicare Other | Admitting: Nurse Practitioner

## 2013-02-24 ENCOUNTER — Encounter: Payer: Self-pay | Admitting: Nurse Practitioner

## 2013-02-24 VITALS — BP 120/75 | HR 63 | Temp 97.3°F | Ht 70.0 in | Wt 181.0 lb

## 2013-02-24 DIAGNOSIS — G459 Transient cerebral ischemic attack, unspecified: Secondary | ICD-10-CM | POA: Diagnosis not present

## 2013-02-24 NOTE — Progress Notes (Signed)
PATIENT: Ronald House DOB: [birth year]   REASON FOR VISIT: follow up for TIA HISTORY FROM: patient  HISTORY OF PRESENT ILLNESS: Ronald House is an 67 y.o. male with no known risk factors for stroke who experienced an episode of speech abnormality at 52 AM on 01/13/13. Speech was unintelligible. Symptoms only last a few seconds. Throughout the day he has had recurrent episodes of difficulty initiating speech, with hesitancy and having difficulty getting out what he wants to say. He has not experienced focal weakness nor numbness except for feeling of tightness around his mouth. CT scan of his head showed no acute intracranial abnormality. There is no history of stroke nor TIA. Patient has not been on antiplatelet therapy. NIH stroke score was 0.  MRI/MRA were completed after discharge at open MRI; both were normal.  Echocardiogram was normal with ejection fraction of 60%.  Carotid dopplers with 0-39% stenosis bilaterally.  He denies history of Migraines and is not diabetic.  He has had no similar  recurrent symptoms.  He does remember being under extra stress that day; dealing with a "plumbing emergency."  He reports that he snores at night.  His wife has never told that he has apneic episodes, but he states she is a heavy sleeper.  He denies ever waking up gasping for air.  His Epworth Score is 3.5; Fatigue Severity Scale is 9.  REVIEW OF SYSTEMS: Full 14 system review of systems performed and notable only for:  Respiratory: snoring  Gastroitestinal: constipation Sleep: snoring   ALLERGIES: No Known Allergies  HOME MEDICATIONS: Outpatient Prescriptions Prior to Visit  Medication Sig Dispense Refill  . alfuzosin (UROXATRAL) 10 MG 24 hr tablet Take 10 mg by mouth every evening.      Marland Kitchen aspirin EC 81 MG EC tablet Take 1 tablet (81 mg total) by mouth daily.  30 tablet  0  . calcium carbonate (OS-CAL) 600 MG TABS Take 600 mg by mouth daily.       . cholecalciferol (VITAMIN D) 1000 UNITS  tablet Take 1,000 Units by mouth daily.       . fish oil-omega-3 fatty acids 1000 MG capsule Take 2 g by mouth daily.       . Multiple Vitamin (MULTIVITAMIN) tablet Take 1 tablet by mouth daily.        No facility-administered medications prior to visit.    PAST MEDICAL HISTORY: Past Medical History  Diagnosis Date  . Peyronie disease   . Prostate cancer   . Arthritis   . Undiagnosed cardiac murmurs     childhood/unknown if present issue  . Bulging lumbar disc   . Hematuria   . DDD (degenerative disc disease), lumbar     PAST SURGICAL HISTORY: Past Surgical History  Procedure Laterality Date  . History of facial surgery  25 yrs ago    related to fracture  . History of hand repair  20 yrs ago    broken right index finger  . Radioactive seed implant  05/22/2011    Procedure: RADIOACTIVE SEED IMPLANT;  Surgeon: Kathi Ludwig, MD;  Location: [facility];  Service: Urology;  Laterality: N/A;  RAD TECH OK PER [facility]     FAMILY HISTORY: Family History  Problem Relation Age of Onset  . Benign prostatic hyperplasia Father   . Cancer Father     throat  . Heart disease Brother     SOCIAL HISTORY: History   Social History  . Marital  Status: Married    Spouse Name: N/A    Number of Children: N/A  . Years of Education: N/A   Occupational History  . Not on file.   Social History Main Topics  . Smoking status: Never Smoker   . Smokeless tobacco: Never Used  . Alcohol Use: 3.0 oz/week    5 Glasses of wine per week  . Drug Use: No  . Sexual Activity:    Other Topics Concern  . Not on file   Social History Narrative  . No narrative on file   PHYSICAL EXAM  Filed Vitals:   02/24/13 0920  BP: 120/75  Pulse: 63  Temp: 97.3 F (36.3 C)  Height: 5\' 10"  (1.778 m)  Weight: 181 lb (82.101 kg)   Body mass index is 25.97 kg/(m^2).  Generalized: Well developed, in no acute distress  Head: normocephalic and atraumatic. Oropharynx benign    Neck: Supple, no carotid bruits  Cardiac: Regular rate rhythm, 2/6 systolic murmur  Musculoskeletal: No deformity   Neurological examination  Mentation: Alert oriented to time, place, history taking. Follows all commands speech and language fluent Cranial nerve II-XII:  Pupils were equal round reactive to light extraocular movements were full, visual field were full on confrontational test. Facial sensation and strength were normal. hearing was intact to finger rubbing bilaterally. Uvula tongue midline. head turning and shoulder shrug and were normal and symmetric.Tongue protrusion into cheek strength was normal. Motor: normal bulk and tone, full strength in the BUE, BLE, fine finger movements normal, no pronator drift. No focal weakness Sensory: normal and symmetric to light touch, pinprick, and  vibration  Coordination: finger-nose-finger, heel-to-shin bilaterally, no dysmetria Reflexes:  Deep tendon reflexes in the upper and lower extremities are present and symmetric.  Gait and Station: Rising up from seated position without assistance, normal stance, without trunk ataxia, moderate stride, good arm swing, smooth turning, able to perform tiptoe, and heel walking without difficulty.   DIAGNOSTIC DATA (LABS, IMAGING, TESTING) - I reviewed patient records, labs, notes, testing and imaging myself where available.  Lab Results  Component Value Date   WBC 4.8 01/14/2013   HGB 14.0 01/14/2013   HCT 42.5 01/14/2013   MCV 90.6 01/14/2013   PLT 166 01/14/2013      Component Value Date/Time   NA 140 01/14/2013 0618   K 4.8 01/14/2013 0618   CL 105 01/14/2013 0618   CO2 29 01/14/2013 0618   GLUCOSE 96 01/14/2013 0618   BUN 18 01/14/2013 0618   CREATININE 1.10 01/14/2013 0618   CALCIUM 8.9 01/14/2013 0618   PROT 6.2 01/14/2013 0618   ALBUMIN 3.4* 01/14/2013 0618   AST 18 01/14/2013 0618   ALT 14 01/14/2013 0618   ALKPHOS 67 01/14/2013 0618   BILITOT 0.4 01/14/2013 0618   GFRNONAA 68*  01/14/2013 0618   GFRAA 79* 01/14/2013 0618   Lab Results  Component Value Date   CHOL 171 01/14/2013   HDL 59 01/14/2013   LDLCALC 98 01/14/2013   TRIG 70 01/14/2013   CHOLHDL 2.9 01/14/2013   Lab Results  Component Value Date   HGBA1C 5.7* 01/14/2013    ASSESSMENT AND PLAN [name] is a 67 y.o. male presenting with multifocal transient symptoms unlikley to be TIA given different vascular distributions. There is no history of seizure of Migraines.  MRI/MRA Imaging does not reveal acute infarct. Possibly anxiety related. Patient with full resolution of symptoms.  We discussed a Sleep Study for snoring, but he declined  at this time.  PLAN: Continue aspirin 81 mg orally every day  For stroke prevention and maintain strict control of hypertension with blood pressure goal below 130/90, and lipids with LDL cholesterol goal below 100 mg/dL.  Followup in the future as needed.  Ronal Fear, MSN, NP-C 02/24/2013, 9:23 AM [facility] [address], [address] [place], [place] [address] [phone]  Note: This document was prepared with digital dictation and possible smart phrase technology. Any transcriptional errors that result from this process are unintentional.

## 2013-02-24 NOTE — Patient Instructions (Signed)
PLAN: Continue aspirin 81 mg orally every day  for secondary stroke prevention and maintain strict control of hypertension with blood pressure goal below 130/90, diabetes with hemoglobin A1c goal below 6.5% and lipids with LDL cholesterol goal below 100 mg/dL.  Followup in the future as needed.

## 2013-03-04 ENCOUNTER — Ambulatory Visit (AMBULATORY_SURGERY_CENTER): Payer: Self-pay

## 2013-03-04 VITALS — Ht 70.0 in | Wt 177.0 lb

## 2013-03-04 DIAGNOSIS — Z1211 Encounter for screening for malignant neoplasm of colon: Secondary | ICD-10-CM

## 2013-03-04 MED ORDER — MOVIPREP 100 G PO SOLR: 1.0000 | Freq: Once | ORAL | Status: DC

## 2013-03-24 ENCOUNTER — Ambulatory Visit (AMBULATORY_SURGERY_CENTER): Payer: BC Managed Care – PPO | Admitting: Gastroenterology

## 2013-03-24 ENCOUNTER — Encounter: Payer: Self-pay | Admitting: Gastroenterology

## 2013-03-24 VITALS — BP 127/73 | HR 52 | Temp 96.4°F | Resp 15 | Ht 70.0 in | Wt 177.0 lb

## 2013-03-24 DIAGNOSIS — Z1211 Encounter for screening for malignant neoplasm of colon: Secondary | ICD-10-CM

## 2013-03-24 DIAGNOSIS — Z8673 Personal history of transient ischemic attack (TIA), and cerebral infarction without residual deficits: Secondary | ICD-10-CM | POA: Diagnosis not present

## 2013-03-24 MED ORDER — SODIUM CHLORIDE 0.9 % IV SOLN: 500.0000 mL | INTRAVENOUS | Status: DC

## 2013-03-24 NOTE — Op Note (Signed)
[place]  [address] [place], [id number]   COLONOSCOPY PROCEDURE REPORT  PATIENT: Ronald House, Ronald House  MR#: [id number] BIRTHDATE: 1945-10-11 , 67  yrs. old GENDER: Male ENDOSCOPIST: Ladene Artist, MD, [facility] PROCEDURE DATE:  03/24/2013 PROCEDURE:   Colonoscopy, screening First Screening Colonoscopy - Avg.  risk and is 50 yrs.  old or older - No.  Prior Negative Screening - Now for repeat screening. 10 or more years since last screening  History of Adenoma - Now for follow-up colonoscopy & has been > or = to 3 yrs.  N/A  Polyps Removed Today? No.  Recommend repeat exam, <10 yrs? No. ASA CLASS:   Class II INDICATIONS:average risk screening. MEDICATIONS: MAC sedation, administered by CRNA, Propofol (Diprivan), and propofol (Diprivan) 250mg  IV DESCRIPTION OF PROCEDURE:   After the risks benefits and alternatives of the procedure were thoroughly explained, informed consent was obtained.  A digital rectal exam revealed no abnormalities of the rectum.   The LB [id number]  endoscope was introduced through the anus and advanced to the cecum, which was identified by both the appendix and ileocecal valve. No adverse events experienced.   The quality of the prep was good, using MoviPrep  The instrument was then slowly withdrawn as the colon was fully examined.  COLON FINDINGS: Moderate diverticulosis was noted in the sigmoid colon.   The colon was otherwise normal.  There was no diverticulosis, inflammation, polyps or cancers unless previously stated.  Retroflexed views revealed small internal hemorrhoids. The time to cecum=2 minutes 52 seconds.  Withdrawal time=9 minutes 55 seconds.  The scope was withdrawn and the procedure completed. COMPLICATIONS: There were no complications.  ENDOSCOPIC IMPRESSION: 1.   Moderate diverticulosis in the sigmoid colon 2.   Small internal hemorrhoids  RECOMMENDATIONS: 1.  High fiber diet with liberal fluid intake. 2.  You  should continue to follow colorectal cancer screening guidelines for "routine risk" patients with a repeat colonoscopy in 10 years.  There is no need for routine screening FOBT (stool) testing for at least 5 years.  eSigned:  Ladene Artist, MD, [facility] 03/24/2013 11:08 AM   cc: Trilby Drummer MD

## 2013-03-24 NOTE — Progress Notes (Signed)
Report to pacu rn, vss, bbs=clear 

## 2013-03-24 NOTE — Patient Instructions (Signed)
YOU HAD AN ENDOSCOPIC PROCEDURE TODAY AT [facility]: Refer to the procedure report that was given to you for any specific questions about what was found during the examination.  If the procedure report does not answer your questions, please call your gastroenterologist to clarify.  If you requested that your care partner not be given the details of your procedure findings, then the procedure report has been included in a sealed envelope for you to review at your convenience later.  YOU SHOULD EXPECT: Some feelings of bloating in the abdomen. Passage of more gas than usual.  Walking can help get rid of the air that was put into your GI tract during the procedure and reduce the bloating. If you had a lower endoscopy (such as a colonoscopy or flexible sigmoidoscopy) you may notice spotting of blood in your stool or on the toilet paper. If you underwent a bowel prep for your procedure, then you may not have a normal bowel movement for a few days.  DIET: Your first meal following the procedure should be a light meal and then it is ok to progress to your normal diet.  A half-sandwich or bowl of soup is an example of a good first meal.  Heavy or fried foods are harder to digest and may make you feel nauseous or bloated.  Likewise meals heavy in dairy and vegetables can cause extra gas to form and this can also increase the bloating.  Drink plenty of fluids but you should avoid alcoholic beverages for 24 hours.  ACTIVITY: Your care partner should take you home directly after the procedure.  You should plan to take it easy, moving slowly for the rest of the day.  You can resume normal activity the day after the procedure however you should NOT DRIVE or use heavy machinery for 24 hours (because of the sedation medicines used during the test).    SYMPTOMS TO REPORT IMMEDIATELY: A gastroenterologist can be reached at any hour.  During normal business hours, 8:30 AM to 5:00 PM Monday through Friday,  call [phone].  After hours and on weekends, please call the GI answering service at [phone] who will take a message and have the physician on call contact you.   Following lower endoscopy (colonoscopy or flexible sigmoidoscopy):  Excessive amounts of blood in the stool  Significant tenderness or worsening of abdominal pains  Swelling of the abdomen that is new, acute  Fever of 100F or higher  FOLLOW UP: Our staff will call the home number listed on your records the next business day following your procedure to check on you and address any questions or concerns that you may have at that time regarding the information given to you following your procedure. This is a courtesy call and so if there is no answer at the home number and we have not heard from you through the emergency physician on call, we will assume that you have returned to your regular daily activities without incident.  SIGNATURES/CONFIDENTIALITY: You and/or your care partner have signed paperwork which will be entered into your electronic medical record.  These signatures attest to the fact that that the information above on your After Visit Summary has been reviewed and is understood.  Full responsibility of the confidentiality of this discharge information lies with you and/or your care-partner.  Please read over handouts about diverticulosis, hemorrhoids, and high fiber diets  Continue your normal medications  Next colonoscopy in 10 years

## 2013-03-25 ENCOUNTER — Telehealth: Payer: Self-pay | Admitting: *Deleted

## 2013-03-25 NOTE — Telephone Encounter (Signed)
Left message that we called for f/u 

## 2013-04-22 DIAGNOSIS — R972 Elevated prostate specific antigen [PSA]: Secondary | ICD-10-CM | POA: Diagnosis not present

## 2013-04-22 DIAGNOSIS — N320 Bladder-neck obstruction: Secondary | ICD-10-CM | POA: Diagnosis not present

## 2013-04-22 DIAGNOSIS — C61 Malignant neoplasm of prostate: Secondary | ICD-10-CM | POA: Diagnosis not present

## 2013-07-22 DIAGNOSIS — N410 Acute prostatitis: Secondary | ICD-10-CM | POA: Diagnosis not present

## 2013-07-22 DIAGNOSIS — C61 Malignant neoplasm of prostate: Secondary | ICD-10-CM | POA: Diagnosis not present

## 2013-07-28 DIAGNOSIS — N410 Acute prostatitis: Secondary | ICD-10-CM | POA: Diagnosis not present

## 2013-10-05 DIAGNOSIS — D219 Benign neoplasm of connective and other soft tissue, unspecified: Secondary | ICD-10-CM | POA: Diagnosis not present

## 2013-10-05 DIAGNOSIS — D239 Other benign neoplasm of skin, unspecified: Secondary | ICD-10-CM | POA: Diagnosis not present

## 2013-10-05 DIAGNOSIS — L570 Actinic keratosis: Secondary | ICD-10-CM | POA: Diagnosis not present

## 2013-10-05 DIAGNOSIS — B351 Tinea unguium: Secondary | ICD-10-CM | POA: Diagnosis not present

## 2013-11-18 DIAGNOSIS — M545 Low back pain, unspecified: Secondary | ICD-10-CM | POA: Diagnosis not present

## 2013-11-18 DIAGNOSIS — M546 Pain in thoracic spine: Secondary | ICD-10-CM | POA: Diagnosis not present

## 2013-11-18 DIAGNOSIS — M5137 Other intervertebral disc degeneration, lumbosacral region: Secondary | ICD-10-CM | POA: Diagnosis not present

## 2013-11-18 DIAGNOSIS — M47817 Spondylosis without myelopathy or radiculopathy, lumbosacral region: Secondary | ICD-10-CM | POA: Diagnosis not present

## 2013-12-01 ENCOUNTER — Emergency Department (HOSPITAL_COMMUNITY)
Admission: EM | Admit: 2013-12-01 | Discharge: 2013-12-02 | Disposition: A | Discharge status: Home / Self Care | Payer: Medicare Other | Attending: Emergency Medicine | Admitting: Emergency Medicine

## 2013-12-01 ENCOUNTER — Emergency Department (HOSPITAL_COMMUNITY): Payer: Medicare Other

## 2013-12-01 ENCOUNTER — Encounter (HOSPITAL_COMMUNITY): Payer: Self-pay | Admitting: Emergency Medicine

## 2013-12-01 DIAGNOSIS — Z79899 Other long term (current) drug therapy: Secondary | ICD-10-CM | POA: Diagnosis not present

## 2013-12-01 DIAGNOSIS — R143 Flatulence: Secondary | ICD-10-CM | POA: Diagnosis not present

## 2013-12-01 DIAGNOSIS — Z87448 Personal history of other diseases of urinary system: Secondary | ICD-10-CM | POA: Insufficient documentation

## 2013-12-01 DIAGNOSIS — M5137 Other intervertebral disc degeneration, lumbosacral region: Secondary | ICD-10-CM | POA: Diagnosis not present

## 2013-12-01 DIAGNOSIS — Z8546 Personal history of malignant neoplasm of prostate: Secondary | ICD-10-CM | POA: Diagnosis not present

## 2013-12-01 DIAGNOSIS — M51379 Other intervertebral disc degeneration, lumbosacral region without mention of lumbar back pain or lower extremity pain: Secondary | ICD-10-CM | POA: Insufficient documentation

## 2013-12-01 DIAGNOSIS — R079 Chest pain, unspecified: Secondary | ICD-10-CM | POA: Diagnosis not present

## 2013-12-01 DIAGNOSIS — J40 Bronchitis, not specified as acute or chronic: Secondary | ICD-10-CM | POA: Diagnosis not present

## 2013-12-01 DIAGNOSIS — Z7982 Long term (current) use of aspirin: Secondary | ICD-10-CM | POA: Insufficient documentation

## 2013-12-01 DIAGNOSIS — R141 Gas pain: Secondary | ICD-10-CM | POA: Insufficient documentation

## 2013-12-01 DIAGNOSIS — R011 Cardiac murmur, unspecified: Secondary | ICD-10-CM | POA: Diagnosis not present

## 2013-12-01 DIAGNOSIS — M129 Arthropathy, unspecified: Secondary | ICD-10-CM | POA: Diagnosis not present

## 2013-12-01 DIAGNOSIS — R142 Eructation: Secondary | ICD-10-CM

## 2013-12-01 LAB — CBC
HEMATOCRIT: 44.3 % (ref 39.0–52.0)
HEMOGLOBIN: 14.9 g/dL (ref 13.0–17.0)
MCH: 29.6 pg (ref 26.0–34.0)
MCHC: 33.6 g/dL (ref 30.0–36.0)
MCV: 87.9 fL (ref 78.0–100.0)
Platelets: 215 10*3/uL (ref 150–400)
RBC: 5.04 MIL/uL (ref 4.22–5.81)
RDW: 13.5 % (ref 11.5–15.5)
WBC: 11.1 10*3/uL — ABNORMAL HIGH (ref 4.0–10.5)

## 2013-12-01 LAB — I-STAT TROPONIN, ED: Troponin i, poc: 0 ng/mL (ref 0.00–0.08)

## 2013-12-01 LAB — BASIC METABOLIC PANEL
Anion gap: 11 (ref 5–15)
BUN: 17 mg/dL (ref 6–23)
CO2: 27 mEq/L (ref 19–32)
Calcium: 9.3 mg/dL (ref 8.4–10.5)
Chloride: 103 mEq/L (ref 96–112)
Creatinine, Ser: 1 mg/dL (ref 0.50–1.35)
GFR calc Af Amer: 88 mL/min — ABNORMAL LOW (ref >90)
GFR, EST NON AFRICAN AMERICAN: 76 mL/min — AB (ref >90)
GLUCOSE: 117 mg/dL — AB (ref 70–99)
POTASSIUM: 4.2 meq/L (ref 3.7–5.3)
Sodium: 141 mEq/L (ref 137–147)

## 2013-12-01 NOTE — ED Notes (Signed)
Pt. Reports generalized chest pain that radiates around to back. Denies N/V, SOB, dizziness. Denies cardiac hx.

## 2013-12-02 ENCOUNTER — Encounter: Payer: Self-pay | Admitting: Gastroenterology

## 2013-12-02 DIAGNOSIS — R079 Chest pain, unspecified: Secondary | ICD-10-CM | POA: Diagnosis not present

## 2013-12-02 LAB — TROPONIN I: Troponin I: <0.3 ng/mL (ref <0.30)

## 2013-12-02 LAB — COMPREHENSIVE METABOLIC PANEL
ALK PHOS: 87 U/L (ref 39–117)
ALT: 16 U/L (ref 0–53)
ANION GAP: 10 (ref 5–15)
AST: 19 U/L (ref 0–37)
Albumin: 3.4 g/dL — ABNORMAL LOW (ref 3.5–5.2)
BILIRUBIN TOTAL: 0.3 mg/dL (ref 0.3–1.2)
BUN: 17 mg/dL (ref 6–23)
CHLORIDE: 101 meq/L (ref 96–112)
CO2: 26 mEq/L (ref 19–32)
Calcium: 9 mg/dL (ref 8.4–10.5)
Creatinine, Ser: 0.94 mg/dL (ref 0.50–1.35)
GFR calc Af Amer: >90 mL/min (ref >90)
GFR calc non Af Amer: 85 mL/min — ABNORMAL LOW (ref >90)
Glucose, Bld: 125 mg/dL — ABNORMAL HIGH (ref 70–99)
POTASSIUM: 4.1 meq/L (ref 3.7–5.3)
Sodium: 137 mEq/L (ref 137–147)
TOTAL PROTEIN: 6.3 g/dL (ref 6.0–8.3)

## 2013-12-02 MED ORDER — NITROGLYCERIN 0.4 MG SL SUBL
0.4000 mg | SUBLINGUAL_TABLET | SUBLINGUAL | Status: AC | PRN
  Administered 2013-12-02 (×3): 0.4 mg via SUBLINGUAL
  Filled 2013-12-02: qty 1

## 2013-12-02 NOTE — ED Notes (Signed)
Pt feeling nauseated following third nitro.  Feels hot/cold.  Placed in supine position, O2 placed.  Currently only pain in mid back, 5/10.  Remains alert, able to carry on conversation

## 2013-12-02 NOTE — ED Notes (Signed)
Pt began experiencing epigastric pain approx noon on 9/17.  Thought it was gas but then back began to hurt as well.  Describes it as tightness/pressure.  Skin pink, wm, dry.  No respiratory symptoms; denies n/v/d.  SR on monitor.

## 2013-12-02 NOTE — Discharge Instructions (Signed)
Chest Pain (Nonspecific) Ronald House, you were seen today for chest pain.  Your labs, ekg and chest xray were normal.  Follow up with your regular doctor within 3 days for continued care.  If your symptoms worsen, come back to the ED immediately for repeat evaluation. Thank you. It is often hard to give a diagnosis for the cause of chest pain. There is always a chance that your pain could be related to something serious, such as a heart attack or a blood clot in the lungs. You need to follow up with your doctor. HOME CARE  If antibiotic medicine was given, take it as directed by your doctor. Finish the medicine even if you start to feel better.  For the next few days, avoid activities that bring on chest pain. Continue physical activities as told by your doctor.  Do not use any tobacco products. This includes cigarettes, chewing tobacco, and e-cigarettes.  Avoid drinking alcohol.  Only take medicine as told by your doctor.  Follow your doctor's suggestions for more testing if your chest pain does not go away.  Keep all doctor visits you made. GET HELP IF:  Your chest pain does not go away, even after treatment.  You have a rash with blisters on your chest.  You have a fever. GET HELP RIGHT AWAY IF:   You have more pain or pain that spreads to your arm, neck, jaw, back, or belly (abdomen).  You have shortness of breath.  You cough more than usual or cough up blood.  You have very bad back or belly pain.  You feel sick to your stomach (nauseous) or throw up (vomit).  You have very bad weakness.  You pass out (faint).  You have chills. This is an emergency. Do not wait to see if the problems will go away. Call your local emergency services (911 in [place] Do not drive yourself to the hospital. MAKE SURE YOU:   Understand these instructions.  Will watch your condition.  Will get help right away if you are not doing well or get worse. Document Released: 08/20/2007 Document  Revised: 03/08/2013 Document Reviewed: 08/20/2007 [facility] Patient Information 2015 [place], [place]. This information is not intended to replace advice given to you by your health care provider. Make sure you discuss any questions you have with your health care provider.

## 2013-12-02 NOTE — ED Notes (Signed)
NAD at this time.  

## 2013-12-02 NOTE — ED Provider Notes (Addendum)
CSN: [id number]     Arrival date & time 12/01/13  2204 History   First MD Initiated Contact with Patient 12/01/13 2354     Chief Complaint  Patient presents with  . Chest Pain     (Consider location/radiation/quality/duration/timing/severity/associated sxs/prior Treatment) HPI Ronald House is a 68 y.o. male with no significant past medical history coming in with subxiphoid pain. He states has occurred all day today. It radiates to his back and it is tight. Nothing makes this pain better or worse. He is concerned for gas pain. He has no associated shortness of breath or diaphoresis. He has no emesis. Patient states he woke up with this. He tried Zantac in he past without any relief. He denies any fevers. He had a mild cough that has been consistent for many weeks that is productive of clear sputum. Patient has no further complaints.  10 Systems reviewed and are negative for acute change except as noted in the HPI.     Past Medical History  Diagnosis Date  . Peyronie disease   . Prostate cancer   . Arthritis   . Undiagnosed cardiac murmurs     childhood/unknown if present issue  . Bulging lumbar disc   . Hematuria   . DDD (degenerative disc disease), lumbar    Past Surgical History  Procedure Laterality Date  . History of facial surgery  25 yrs ago    related to fracture  . History of hand repair  20 yrs ago    broken right index finger  . Radioactive seed implant  05/22/2011    Procedure: RADIOACTIVE SEED IMPLANT;  Surgeon: Ailene Rud, MD;  Location: [facility];  Service: Urology;  Laterality: N/A;  RAD TECH OK PER [facility]    Family History  Problem Relation Age of Onset  . Benign prostatic hyperplasia Father   . Cancer Father     throat  . Heart disease Brother   . Colon cancer Neg Hx    History  Substance Use Topics  . Smoking status: Never Smoker   . Smokeless tobacco: Never Used  . Alcohol Use: 3.0 oz/week    5 Glasses of wine per  week    Review of Systems    Allergies  Review of patient's allergies indicates no known allergies.  Home Medications   Prior to Admission medications   Medication Sig Start Date End Date Taking? Authorizing Provider  aspirin EC 81 MG EC tablet Take 1 tablet (81 mg total) by mouth daily. 01/14/13  Yes Modena Jansky, MD  calcium carbonate (OS-CAL) 600 MG TABS Take 600 mg by mouth daily.    Yes Historical Provider, MD  fish oil-omega-3 fatty acids 1000 MG capsule Take 2 g by mouth daily.    Yes Historical Provider, MD  Multiple Vitamin (MULTIVITAMIN) tablet Take 1 tablet by mouth daily.    Yes Historical Provider, MD  PRESCRIPTION MEDICATION Take 1 tablet by mouth 2 (two) times daily as needed (pain). Pain medication for back   Yes Historical Provider, MD   BP 127/66  Pulse 69  Temp(Src) 98.3 F (36.8 C) (Oral)  Resp 12  Ht 5\' 10"  (1.778 m)  Wt 177 lb (80.287 kg)  BMI 25.40 kg/m2  SpO2 91% Physical Exam  Nursing note and vitals reviewed. Constitutional: He is oriented to person, place, and time. Vital signs are normal. He appears well-developed and well-nourished.  Non-toxic appearance. He does not appear ill. No distress.  HENT:  Head: Normocephalic and atraumatic.  Nose: Nose normal.  Mouth/Throat: Oropharynx is clear and moist. No oropharyngeal exudate.  Eyes: Conjunctivae and EOM are normal. Pupils are equal, round, and reactive to light. No scleral icterus.  Neck: Normal range of motion. Neck supple. No tracheal deviation, no edema, no erythema and normal range of motion present. No mass and no thyromegaly present.  Cardiovascular: Normal rate, regular rhythm, S1 normal, S2 normal, normal heart sounds, intact distal pulses and normal pulses.  Exam reveals no gallop and no friction rub.   No murmur heard. Pulses:      Radial pulses are 2+ on the right side, and 2+ on the left side.       Dorsalis pedis pulses are 2+ on the right side, and 2+ on the left side.   Pulmonary/Chest: Effort normal and breath sounds normal. No respiratory distress. He has no wheezes. He has no rhonchi. He has no rales.  Abdominal: Soft. Normal appearance and bowel sounds are normal. He exhibits no distension, no ascites and no mass. There is no hepatosplenomegaly. There is no tenderness. There is no rebound, no guarding and no CVA tenderness.  Musculoskeletal: Normal range of motion. He exhibits no edema and no tenderness.  Lymphadenopathy:    He has no cervical adenopathy.  Neurological: He is alert and oriented to person, place, and time. He has normal strength. No cranial nerve deficit or sensory deficit. GCS eye subscore is 4. GCS verbal subscore is 5. GCS motor subscore is 6.  Skin: Skin is warm, dry and intact. No petechiae and no rash noted. He is not diaphoretic. No erythema. No pallor.  Psychiatric: He has a normal mood and affect. His behavior is normal. Judgment normal.    ED Course  Procedures (including critical care time) Labs Review Labs Reviewed  CBC - Abnormal; Notable for the following:    WBC 11.1 (*)    All other components within normal limits  BASIC METABOLIC PANEL - Abnormal; Notable for the following:    Glucose, Bld 117 (*)    GFR calc non Af Amer 76 (*)    GFR calc Af Amer 88 (*)    All other components within normal limits  COMPREHENSIVE METABOLIC PANEL - Abnormal; Notable for the following:    Glucose, Bld 125 (*)    Albumin 3.4 (*)    GFR calc non Af Amer 85 (*)    All other components within normal limits  TROPONIN I  TROPONIN I  I-STAT TROPOININ, ED    Imaging Review Dg Chest 2 View  12/01/2013   CLINICAL DATA:  Chest pain and shortness of breath.  EXAM: CHEST  2 VIEW  COMPARISON:  04/16/2011  FINDINGS: Lungs are hyperinflated. There is perihilar peribronchial thickening. No focal consolidations or pleural effusions. Heart is normal in size. No pulmonary edema.  IMPRESSION: 1. Bronchitic change. 2.  No focal acute pulmonary  abnormality.   Electronically Signed   By: Shon Hale M.D.   On: 12/01/2013 23:47     EKG Interpretation None      MDM   Final diagnoses:  None    Patient presents to ED have concern for chest pain. Patient's heart score is currently 0, will obtain a second troponin and reevaluate.  Repeat troponin is negative and EKG remains unchanged. Patient's pain has now gone away. His heart score continues to be one. He is safe for discharge. His vital signs remain within his normal limits. He is advised to followup  as primary care physician within 3 days for further evaluation of his chest pain.    Everlene Balls, MD 12/02/13 9093  Everlene Balls, MD 12/02/13 [phone]

## 2013-12-16 DIAGNOSIS — Z125 Encounter for screening for malignant neoplasm of prostate: Secondary | ICD-10-CM | POA: Diagnosis not present

## 2013-12-16 DIAGNOSIS — Z0000 Encounter for general adult medical examination without abnormal findings: Secondary | ICD-10-CM | POA: Diagnosis not present

## 2013-12-23 DIAGNOSIS — E785 Hyperlipidemia, unspecified: Secondary | ICD-10-CM | POA: Diagnosis not present

## 2013-12-23 DIAGNOSIS — M810 Age-related osteoporosis without current pathological fracture: Secondary | ICD-10-CM | POA: Diagnosis not present

## 2013-12-23 DIAGNOSIS — Z23 Encounter for immunization: Secondary | ICD-10-CM | POA: Diagnosis not present

## 2013-12-23 DIAGNOSIS — M545 Low back pain: Secondary | ICD-10-CM | POA: Diagnosis not present

## 2014-02-17 DIAGNOSIS — M545 Low back pain: Secondary | ICD-10-CM | POA: Diagnosis not present

## 2014-02-17 DIAGNOSIS — M47816 Spondylosis without myelopathy or radiculopathy, lumbar region: Secondary | ICD-10-CM | POA: Diagnosis not present

## 2014-02-17 DIAGNOSIS — M5136 Other intervertebral disc degeneration, lumbar region: Secondary | ICD-10-CM | POA: Diagnosis not present

## 2014-02-17 DIAGNOSIS — Z6825 Body mass index (BMI) 25.0-25.9, adult: Secondary | ICD-10-CM | POA: Diagnosis not present

## 2014-02-20 ENCOUNTER — Ambulatory Visit: Payer: Medicare Other | Attending: Neurosurgery | Admitting: Physical Therapy

## 2014-02-20 DIAGNOSIS — M545 Low back pain: Secondary | ICD-10-CM | POA: Diagnosis not present

## 2014-02-24 ENCOUNTER — Ambulatory Visit: Payer: Medicare Other | Admitting: Physical Therapy

## 2014-02-27 ENCOUNTER — Ambulatory Visit: Payer: Medicare Other | Admitting: Physical Therapy

## 2014-02-27 DIAGNOSIS — M545 Low back pain: Secondary | ICD-10-CM | POA: Diagnosis not present

## 2014-03-01 ENCOUNTER — Ambulatory Visit: Payer: Medicare Other | Admitting: Physical Therapy

## 2014-03-01 DIAGNOSIS — M545 Low back pain: Secondary | ICD-10-CM | POA: Diagnosis not present

## 2014-04-21 DIAGNOSIS — C61 Malignant neoplasm of prostate: Secondary | ICD-10-CM | POA: Diagnosis not present

## 2014-04-28 DIAGNOSIS — R972 Elevated prostate specific antigen [PSA]: Secondary | ICD-10-CM | POA: Diagnosis not present

## 2014-04-28 DIAGNOSIS — C61 Malignant neoplasm of prostate: Secondary | ICD-10-CM | POA: Diagnosis not present

## 2014-09-11 ENCOUNTER — Other Ambulatory Visit: Payer: Self-pay

## 2014-10-01 IMAGING — CT CT HEAD W/O CM
1 series · 16 of 30 positions shown, 20 images · non-contrast
Comparison: None.

CLINICAL DATA: Aphasia, numbness, transient ischemic attack

EXAM:
CT HEAD WITHOUT CONTRAST
TECHNIQUE: Contiguous axial images were obtained from the base of the skull
through the vertex without intravenous contrast.

[Series 2: head 5.0 h30s · axial · 0.44mm/px · z∈[-197,-47]mm · 16 of 34 slices shown, 20 images]
[im 2/34  brain]
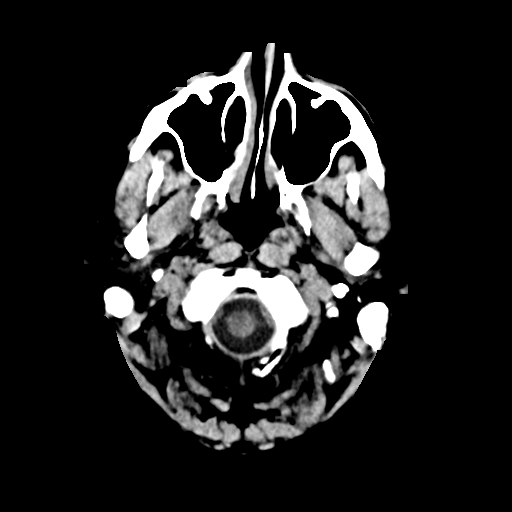
[im 2/34  bone]
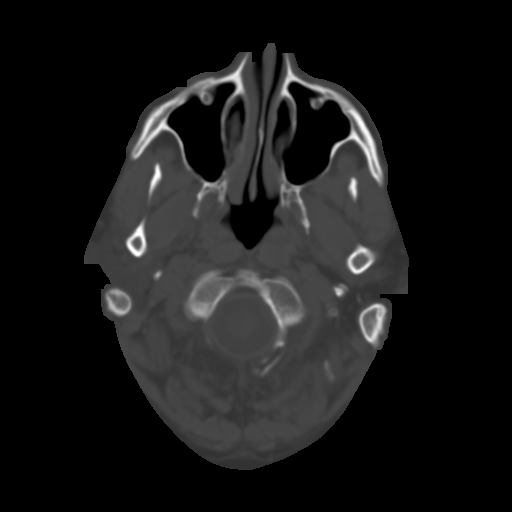
[im 4/34  brain]
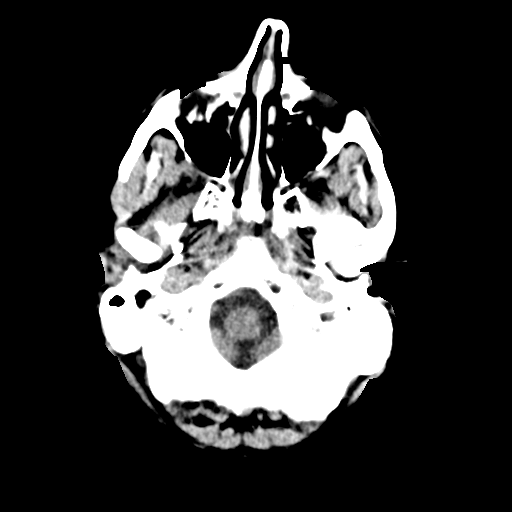
[im 6/34  brain]
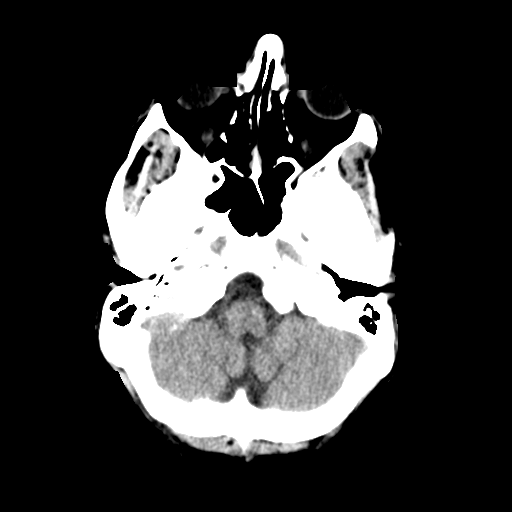
[im 8/34  brain]
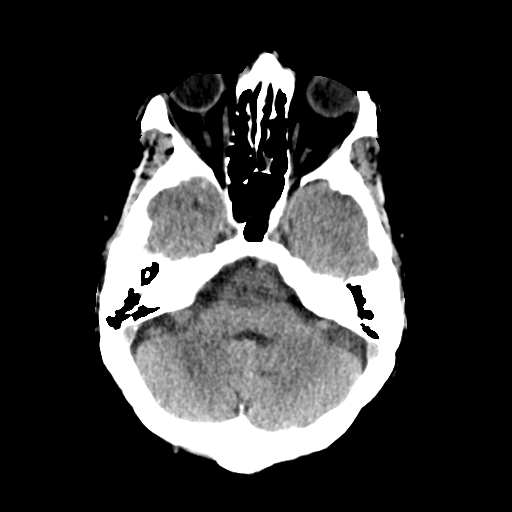
[im 10/34  brain]
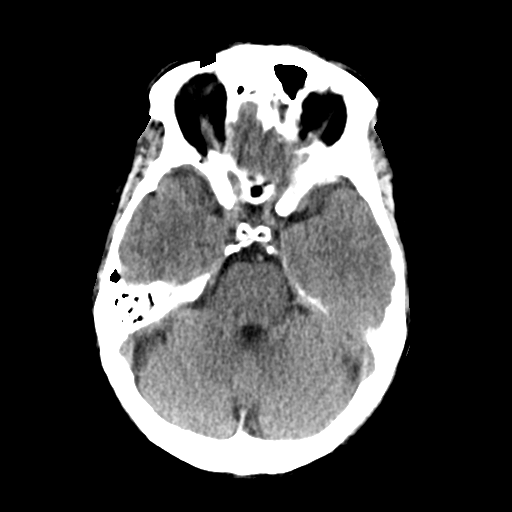
[im 10/34  bone]
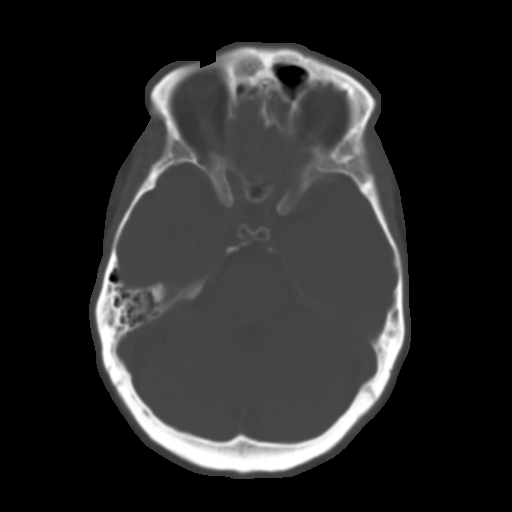
[im 12/34  brain]
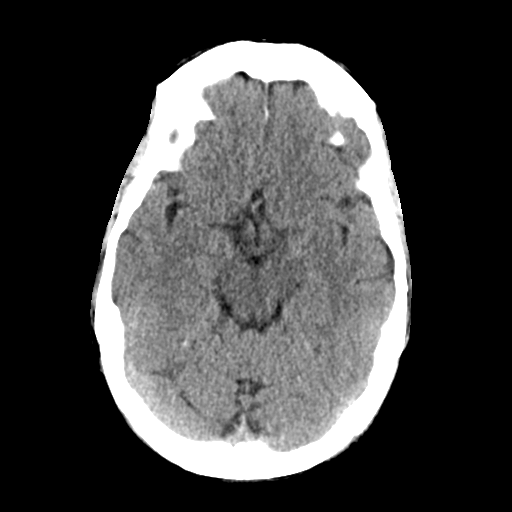
[im 14/34  brain]
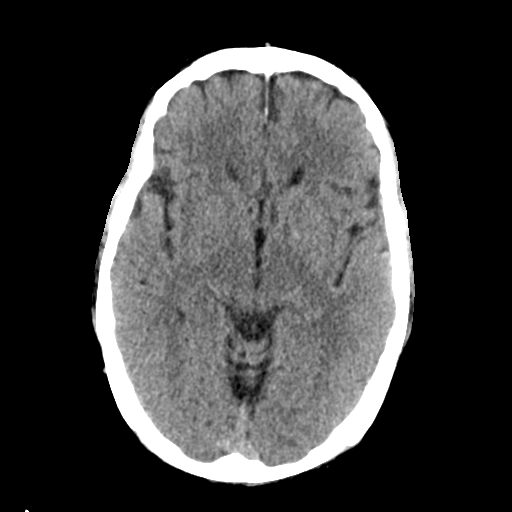
[im 16/34  brain]
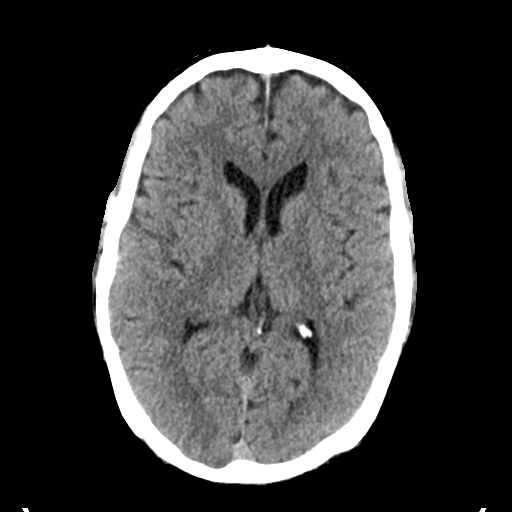
[im 18/34  brain]
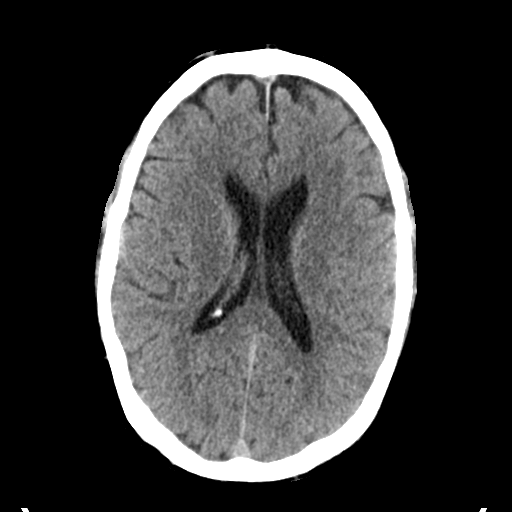
[im 18/34  bone]
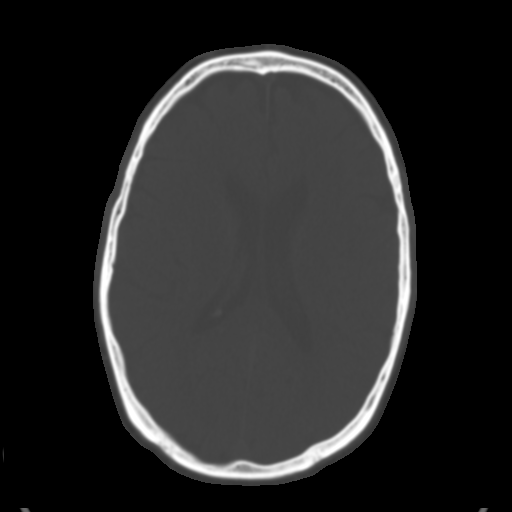
[im 20/34  brain]
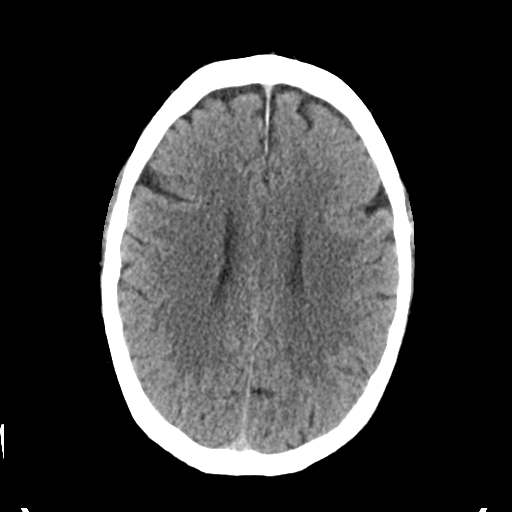
[im 22/34  brain]
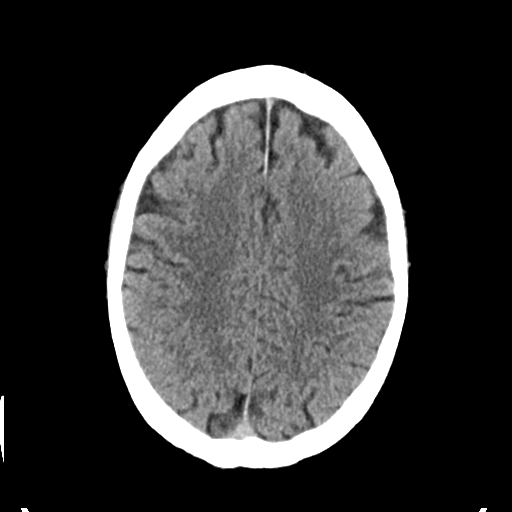
[im 24/34  brain]
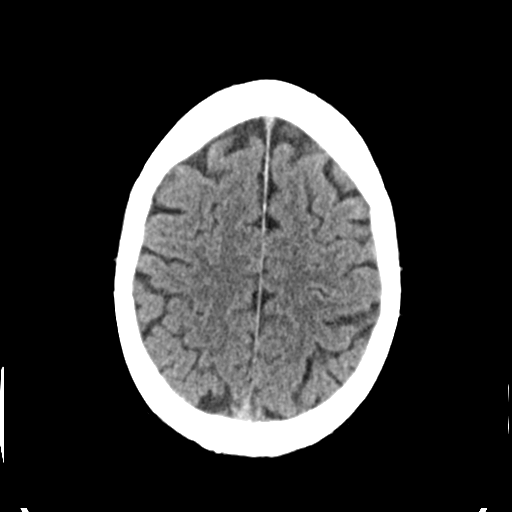
[im 26/34  brain]
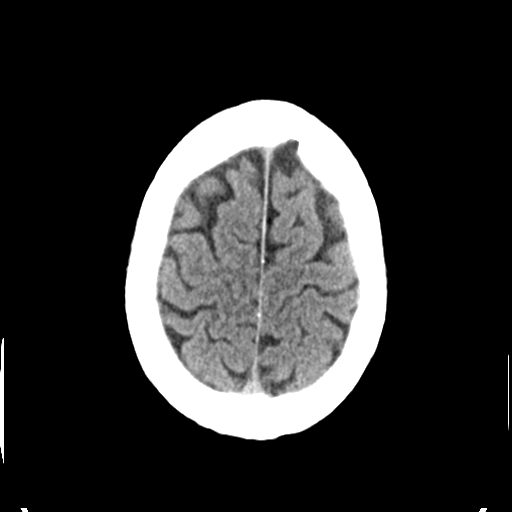
[im 26/34  bone]
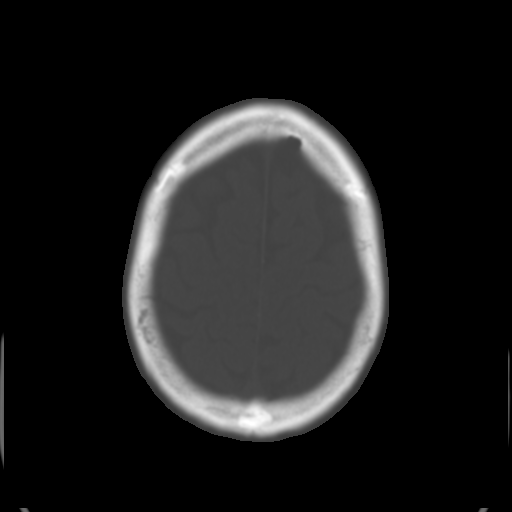
[im 28/34  brain]
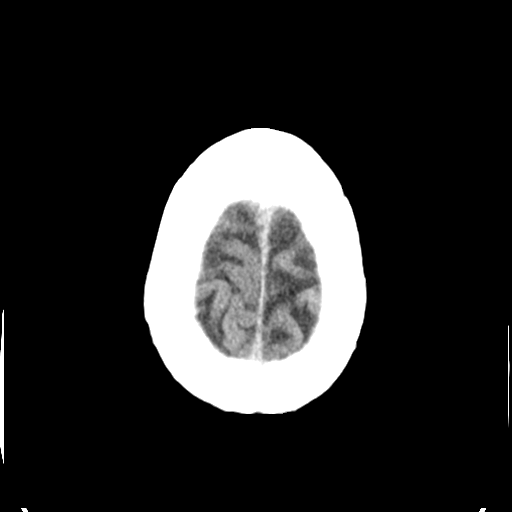
[im 30/34  brain]
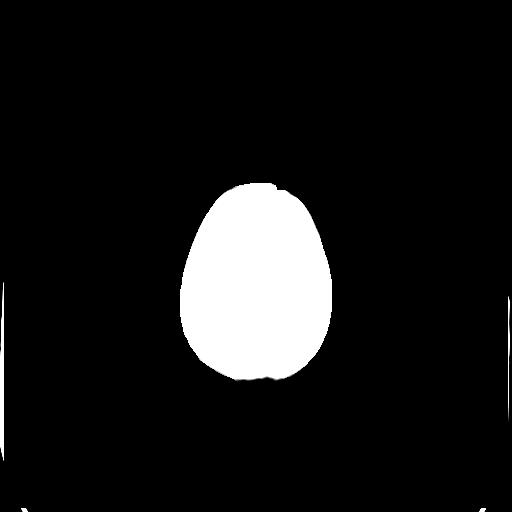
[im 32/34  brain]
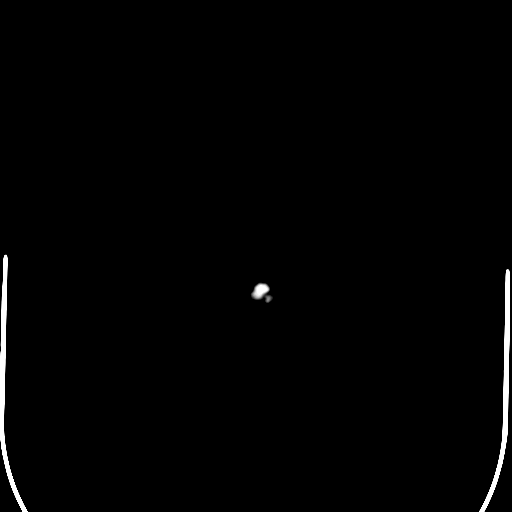

[16 of 30 positions shown; findings below may reference images not displayed]

FINDINGS: Negative for acute intracranial hemorrhage, acute infarction, mass,
mass effect, hydrocephalus or midline shift. Gray-white
differentiation is preserved throughout. No acute soft tissue or
calvarial abnormality. Globes and orbits are intact and symmetric
bilaterally. Normal aeration of the paranasal sinuses and mastoid
air cells.
IMPRESSION: Negative head CT.

## 2014-10-11 DIAGNOSIS — D239 Other benign neoplasm of skin, unspecified: Secondary | ICD-10-CM | POA: Diagnosis not present

## 2014-10-11 DIAGNOSIS — L719 Rosacea, unspecified: Secondary | ICD-10-CM | POA: Diagnosis not present

## 2014-10-11 DIAGNOSIS — L821 Other seborrheic keratosis: Secondary | ICD-10-CM | POA: Diagnosis not present

## 2014-11-16 DIAGNOSIS — R6889 Other general symptoms and signs: Secondary | ICD-10-CM | POA: Diagnosis not present

## 2014-11-16 DIAGNOSIS — R9431 Abnormal electrocardiogram [ECG] [EKG]: Secondary | ICD-10-CM | POA: Diagnosis not present

## 2014-11-16 DIAGNOSIS — R079 Chest pain, unspecified: Secondary | ICD-10-CM | POA: Diagnosis not present

## 2014-11-16 DIAGNOSIS — K219 Gastro-esophageal reflux disease without esophagitis: Secondary | ICD-10-CM | POA: Diagnosis not present

## 2014-11-17 DIAGNOSIS — I6523 Occlusion and stenosis of bilateral carotid arteries: Secondary | ICD-10-CM | POA: Diagnosis not present

## 2014-11-17 DIAGNOSIS — R0789 Other chest pain: Secondary | ICD-10-CM | POA: Diagnosis not present

## 2014-11-17 DIAGNOSIS — Z8673 Personal history of transient ischemic attack (TIA), and cerebral infarction without residual deficits: Secondary | ICD-10-CM | POA: Diagnosis not present

## 2014-11-17 DIAGNOSIS — R030 Elevated blood-pressure reading, without diagnosis of hypertension: Secondary | ICD-10-CM | POA: Diagnosis not present

## 2014-11-24 ENCOUNTER — Inpatient Hospital Stay (HOSPITAL_COMMUNITY)
Admission: EM | Admit: 2014-11-24 | Discharge: 2014-11-25 | DRG: 247 | Disposition: A | Discharge status: Home / Self Care | Payer: Medicare Other | Attending: Cardiology | Admitting: Cardiology

## 2014-11-24 ENCOUNTER — Emergency Department (HOSPITAL_COMMUNITY): Payer: Medicare Other

## 2014-11-24 ENCOUNTER — Ambulatory Visit (HOSPITAL_COMMUNITY): Admission: AD | Admit: 2014-11-24 | Payer: Self-pay | Admitting: Cardiology

## 2014-11-24 ENCOUNTER — Encounter (HOSPITAL_COMMUNITY)
Admission: EM | Disposition: A | Discharge status: Home / Self Care | Payer: Self-pay | Source: Home / Self Care | Attending: Cardiology

## 2014-11-24 ENCOUNTER — Encounter (HOSPITAL_COMMUNITY): Payer: Self-pay | Admitting: Emergency Medicine

## 2014-11-24 DIAGNOSIS — Z8546 Personal history of malignant neoplasm of prostate: Secondary | ICD-10-CM

## 2014-11-24 DIAGNOSIS — Z7982 Long term (current) use of aspirin: Secondary | ICD-10-CM

## 2014-11-24 DIAGNOSIS — I214 Non-ST elevation (NSTEMI) myocardial infarction: Principal | ICD-10-CM | POA: Diagnosis present

## 2014-11-24 DIAGNOSIS — Z23 Encounter for immunization: Secondary | ICD-10-CM

## 2014-11-24 DIAGNOSIS — Z8673 Personal history of transient ischemic attack (TIA), and cerebral infarction without residual deficits: Secondary | ICD-10-CM | POA: Diagnosis not present

## 2014-11-24 DIAGNOSIS — R079 Chest pain, unspecified: Secondary | ICD-10-CM | POA: Diagnosis not present

## 2014-11-24 DIAGNOSIS — I25110 Atherosclerotic heart disease of native coronary artery with unstable angina pectoris: Secondary | ICD-10-CM | POA: Diagnosis present

## 2014-11-24 DIAGNOSIS — Z8249 Family history of ischemic heart disease and other diseases of the circulatory system: Secondary | ICD-10-CM

## 2014-11-24 DIAGNOSIS — I6523 Occlusion and stenosis of bilateral carotid arteries: Secondary | ICD-10-CM | POA: Diagnosis present

## 2014-11-24 DIAGNOSIS — I249 Acute ischemic heart disease, unspecified: Secondary | ICD-10-CM | POA: Diagnosis not present

## 2014-11-24 DIAGNOSIS — M5136 Other intervertebral disc degeneration, lumbar region: Secondary | ICD-10-CM | POA: Diagnosis present

## 2014-11-24 DIAGNOSIS — R0602 Shortness of breath: Secondary | ICD-10-CM | POA: Diagnosis not present

## 2014-11-24 DIAGNOSIS — R0789 Other chest pain: Secondary | ICD-10-CM | POA: Diagnosis not present

## 2014-11-24 DIAGNOSIS — I200 Unstable angina: Secondary | ICD-10-CM | POA: Diagnosis present

## 2014-11-24 HISTORY — DX: Low back pain, unspecified: M54.50

## 2014-11-24 HISTORY — DX: Other chronic pain: G89.29

## 2014-11-24 HISTORY — DX: Gastro-esophageal reflux disease without esophagitis: K21.9

## 2014-11-24 HISTORY — PX: CORONARY ANGIOPLASTY WITH STENT PLACEMENT: SHX49

## 2014-11-24 HISTORY — DX: Low back pain: M54.5

## 2014-11-24 HISTORY — DX: Atherosclerotic heart disease of native coronary artery without angina pectoris: I25.10

## 2014-11-24 HISTORY — PX: CARDIAC CATHETERIZATION: SHX172

## 2014-11-24 HISTORY — DX: Anxiety disorder, unspecified: F41.9

## 2014-11-24 LAB — TROPONIN I: TROPONIN I: 0.15 ng/mL — AB (ref <0.031)

## 2014-11-24 LAB — BASIC METABOLIC PANEL
Anion gap: 8 (ref 5–15)
BUN: 14 mg/dL (ref 6–20)
CHLORIDE: 104 mmol/L (ref 101–111)
CO2: 25 mmol/L (ref 22–32)
Calcium: 9 mg/dL (ref 8.9–10.3)
Creatinine, Ser: 1.25 mg/dL — ABNORMAL HIGH (ref 0.61–1.24)
GFR calc Af Amer: >60 mL/min (ref >60)
GFR, EST NON AFRICAN AMERICAN: 57 mL/min — AB (ref >60)
GLUCOSE: 130 mg/dL — AB (ref 65–99)
POTASSIUM: 4.4 mmol/L (ref 3.5–5.1)
Sodium: 137 mmol/L (ref 135–145)

## 2014-11-24 LAB — CBC
HEMATOCRIT: 45.3 % (ref 39.0–52.0)
Hemoglobin: 14.7 g/dL (ref 13.0–17.0)
MCH: 29.4 pg (ref 26.0–34.0)
MCHC: 32.5 g/dL (ref 30.0–36.0)
MCV: 90.6 fL (ref 78.0–100.0)
Platelets: 209 10*3/uL (ref 150–400)
RBC: 5 MIL/uL (ref 4.22–5.81)
RDW: 13 % (ref 11.5–15.5)
WBC: 9.9 10*3/uL (ref 4.0–10.5)

## 2014-11-24 LAB — I-STAT TROPONIN, ED: Troponin i, poc: 0.03 ng/mL (ref 0.00–0.08)

## 2014-11-24 LAB — APTT: aPTT: 27 seconds (ref 24–37)

## 2014-11-24 LAB — POCT ACTIVATED CLOTTING TIME: Activated Clotting Time: 208 seconds

## 2014-11-24 LAB — PROTIME-INR
INR: 1.08 (ref 0.00–1.49)
Prothrombin Time: 14.2 seconds (ref 11.6–15.2)

## 2014-11-24 SURGERY — LEFT HEART CATH AND CORONARY ANGIOGRAPHY
Anesthesia: LOCAL

## 2014-11-24 MED ORDER — SODIUM CHLORIDE 0.9 % IJ SOLN: 3.0000 mL | INTRAMUSCULAR | Status: DC | PRN

## 2014-11-24 MED ORDER — NITROGLYCERIN 0.4 MG SL SUBL: 0.4000 mg | SUBLINGUAL_TABLET | SUBLINGUAL | Status: DC | PRN

## 2014-11-24 MED ORDER — MIDAZOLAM HCL 2 MG/2ML IJ SOLN
INTRAMUSCULAR | Status: AC
  Filled 2014-11-24: qty 4

## 2014-11-24 MED ORDER — SODIUM CHLORIDE 0.9 % IV SOLN: 250.0000 mL | INTRAVENOUS | Status: DC | PRN

## 2014-11-24 MED ORDER — ALPRAZOLAM 0.25 MG PO TABS: 0.2500 mg | ORAL_TABLET | Freq: Two times a day (BID) | ORAL | Status: DC | PRN

## 2014-11-24 MED ORDER — ACETAMINOPHEN 325 MG PO TABS: 650.0000 mg | ORAL_TABLET | ORAL | Status: DC | PRN

## 2014-11-24 MED ORDER — HEPARIN SODIUM (PORCINE) 1000 UNIT/ML IJ SOLN
INTRAMUSCULAR | Status: AC
  Filled 2014-11-24: qty 1

## 2014-11-24 MED ORDER — LIDOCAINE HCL (PF) 1 % IJ SOLN
INTRAMUSCULAR | Status: DC | PRN
Start: 2014-11-24 — End: 2014-11-24
  Administered 2014-11-24: 7.5 mL via INTRA_ARTERIAL

## 2014-11-24 MED ORDER — SODIUM CHLORIDE 0.9 % WEIGHT BASED INFUSION
3.0000 mL/kg/h | INTRAVENOUS | Status: AC
  Administered 2014-11-24: 3 mL/kg/h via INTRAVENOUS

## 2014-11-24 MED ORDER — ASPIRIN EC 81 MG PO TBEC
81.0000 mg | DELAYED_RELEASE_TABLET | Freq: Every day | ORAL | Status: DC
  Administered 2014-11-25: 81 mg via ORAL
  Filled 2014-11-24: qty 1

## 2014-11-24 MED ORDER — LIDOCAINE HCL (PF) 1 % IJ SOLN
INTRAMUSCULAR | Status: AC
  Filled 2014-11-24: qty 30

## 2014-11-24 MED ORDER — INFLUENZA VAC SPLIT QUAD 0.5 ML IM SUSY
0.5000 mL | PREFILLED_SYRINGE | INTRAMUSCULAR | Status: AC
  Administered 2014-11-25: 0.5 mL via INTRAMUSCULAR
  Filled 2014-11-24 (×2): qty 0.5

## 2014-11-24 MED ORDER — HEPARIN SODIUM (PORCINE) 5000 UNIT/ML IJ SOLN
INTRAMUSCULAR | Status: AC
  Filled 2014-11-24: qty 1

## 2014-11-24 MED ORDER — HYDROMORPHONE HCL 1 MG/ML IJ SOLN
INTRAMUSCULAR | Status: AC
  Filled 2014-11-24: qty 1

## 2014-11-24 MED ORDER — CARVEDILOL 3.125 MG PO TABS
3.1250 mg | ORAL_TABLET | Freq: Two times a day (BID) | ORAL | Status: DC
  Administered 2014-11-25: 3.125 mg via ORAL
  Filled 2014-11-24 (×3): qty 1

## 2014-11-24 MED ORDER — ASPIRIN 81 MG PO CHEW: 324.0000 mg | CHEWABLE_TABLET | ORAL | Status: DC

## 2014-11-24 MED ORDER — NITROGLYCERIN 1 MG/10 ML FOR IR/CATH LAB
INTRA_ARTERIAL | Status: AC
  Filled 2014-11-24: qty 10

## 2014-11-24 MED ORDER — SODIUM CHLORIDE 0.9 % IJ SOLN: 3.0000 mL | Freq: Two times a day (BID) | INTRAMUSCULAR | Status: DC

## 2014-11-24 MED ORDER — NITROGLYCERIN 1 MG/10 ML FOR IR/CATH LAB
INTRA_ARTERIAL | Status: DC | PRN
  Administered 2014-11-24: 15 mL

## 2014-11-24 MED ORDER — ONDANSETRON HCL 4 MG/2ML IJ SOLN: 4.0000 mg | Freq: Three times a day (TID) | INTRAMUSCULAR | Status: DC | PRN

## 2014-11-24 MED ORDER — IOHEXOL 350 MG/ML SOLN
INTRAVENOUS | Status: DC | PRN
Start: 2014-11-24 — End: 2014-11-24
  Administered 2014-11-24: 90 mL via INTRA_ARTERIAL

## 2014-11-24 MED ORDER — HEPARIN BOLUS VIA INFUSION
4000.0000 [IU] | Freq: Once | INTRAVENOUS | Status: AC
  Administered 2014-11-24: 4000 [IU] via INTRAVENOUS
  Filled 2014-11-24: qty 4000

## 2014-11-24 MED ORDER — HEPARIN (PORCINE) IN NACL 100-0.45 UNIT/ML-% IJ SOLN
1000.0000 [IU]/h | INTRAMUSCULAR | Status: DC
  Administered 2014-11-24: 1000 [IU]/h via INTRAVENOUS
  Filled 2014-11-24: qty 250

## 2014-11-24 MED ORDER — HEPARIN SODIUM (PORCINE) 1000 UNIT/ML IJ SOLN
INTRAMUSCULAR | Status: DC | PRN
  Administered 2014-11-24 (×2): 4000 [IU] via INTRAVENOUS

## 2014-11-24 MED ORDER — SODIUM CHLORIDE 0.9 % IV SOLN
Freq: Once | INTRAVENOUS | Status: AC
  Administered 2014-11-24: 10 mL/h via INTRAVENOUS

## 2014-11-24 MED ORDER — NITROGLYCERIN 1 MG/10 ML FOR IR/CATH LAB
INTRA_ARTERIAL | Status: DC | PRN
  Administered 2014-11-24 (×2): 200 ug via INTRACORONARY

## 2014-11-24 MED ORDER — TICAGRELOR 90 MG PO TABS
ORAL_TABLET | ORAL | Status: DC | PRN
Start: 2014-11-24 — End: 2014-11-24
  Administered 2014-11-24: 180 mg via ORAL

## 2014-11-24 MED ORDER — SODIUM CHLORIDE 0.9 % IV SOLN
INTRAVENOUS | Status: DC | PRN
  Administered 2014-11-24 (×2): 80 mL/h via INTRAVENOUS

## 2014-11-24 MED ORDER — ASPIRIN 300 MG RE SUPP: 300.0000 mg | RECTAL | Status: DC

## 2014-11-24 MED ORDER — TICAGRELOR 90 MG PO TABS
90.0000 mg | ORAL_TABLET | Freq: Two times a day (BID) | ORAL | Status: DC
  Administered 2014-11-25: 90 mg via ORAL
  Filled 2014-11-24: qty 1

## 2014-11-24 MED ORDER — MIDAZOLAM HCL 2 MG/2ML IJ SOLN
INTRAMUSCULAR | Status: DC | PRN
  Administered 2014-11-24: 2 mg via INTRAVENOUS

## 2014-11-24 MED ORDER — VERAPAMIL HCL 2.5 MG/ML IV SOLN
INTRAVENOUS | Status: AC
  Filled 2014-11-24: qty 2

## 2014-11-24 MED ORDER — ATORVASTATIN CALCIUM 80 MG PO TABS
80.0000 mg | ORAL_TABLET | Freq: Every day | ORAL | Status: DC
  Administered 2014-11-24: 19:00:00 80 mg via ORAL
  Filled 2014-11-24 (×2): qty 1

## 2014-11-24 MED ORDER — HEPARIN (PORCINE) IN NACL 2-0.9 UNIT/ML-% IJ SOLN
INTRAMUSCULAR | Status: AC
  Filled 2014-11-24: qty 1000

## 2014-11-24 MED ORDER — ONDANSETRON HCL 4 MG/2ML IJ SOLN: 4.0000 mg | Freq: Four times a day (QID) | INTRAMUSCULAR | Status: DC | PRN

## 2014-11-24 MED ORDER — NITROGLYCERIN IN D5W 200-5 MCG/ML-% IV SOLN
5.0000 ug/min | Freq: Once | INTRAVENOUS | Status: AC
  Administered 2014-11-24: 5 ug/min via INTRAVENOUS
  Filled 2014-11-24: qty 250

## 2014-11-24 MED ORDER — HYDROMORPHONE HCL 1 MG/ML IJ SOLN
INTRAMUSCULAR | Status: DC | PRN
  Administered 2014-11-24: 0.5 mg via INTRAVENOUS

## 2014-11-24 SURGICAL SUPPLY — 14 items
BALLN EUPHORA RX 3.0X15 (BALLOONS) ×2
BALLOON EUPHORA RX 3.0X15 (BALLOONS) IMPLANT
CATH OPTITORQUE TIG 4.0 5F (CATHETERS) ×2 IMPLANT
CATH VISTA GUIDE 6FR XB3.5 (CATHETERS) ×1 IMPLANT
DEVICE RAD COMP TR BAND LRG (VASCULAR PRODUCTS) ×2 IMPLANT
GLIDESHEATH SLEND A-KIT 6F 20G (SHEATH) ×2 IMPLANT
KIT ENCORE 26 ADVANTAGE (KITS) ×1 IMPLANT
KIT HEART LEFT (KITS) ×2 IMPLANT
PACK CARDIAC CATHETERIZATION (CUSTOM PROCEDURE TRAY) ×2 IMPLANT
STENT RESOLUTE INTEG 4.0X22 (Permanent Stent) ×1 IMPLANT
TRANSDUCER W/STOPCOCK (MISCELLANEOUS) ×2 IMPLANT
TUBING CIL FLEX 10 FLL-RA (TUBING) ×2 IMPLANT
WIRE COUGAR XT STRL 190CM (WIRE) ×1 IMPLANT
WIRE SAFE-T 1.5MM-J .035X260CM (WIRE) ×2 IMPLANT

## 2014-11-24 NOTE — ED Notes (Signed)
Cath lab called stated to transport patient at 1500.

## 2014-11-24 NOTE — ED Notes (Addendum)
Shorts, socks and shoes placed in belongings bag given to wife at bedside.

## 2014-11-24 NOTE — Progress Notes (Signed)
ANTICOAGULATION CONSULT NOTE - Initial Consult  Pharmacy Consult for heparin  Indication: chest pain/ACS  No Known Allergies  Patient Measurements:   Heparin Dosing Weight:   Vital Signs: Temp: 98.3 F (36.8 C) (09/09 1338) Temp Source: Oral (09/09 1338) BP: 131/75 mmHg (09/09 1338) Pulse Rate: 64 (09/09 1338)  Labs:  Recent Labs  11/24/14 1400  HGB 14.7  HCT 45.3  PLT 209    CrCl cannot be calculated (Unknown ideal weight.).   Medical History: Past Medical History  Diagnosis Date  . Peyronie disease   . Prostate cancer   . Arthritis   . Undiagnosed cardiac murmurs     childhood/unknown if present issue  . Bulging lumbar disc   . Hematuria   . DDD (degenerative disc disease), lumbar     Medications:  Infusions:  . heparin      Assessment: 30 yom presented to the ED with CP. To start IV heparin for anticoagulation. Baseline CBC is WNL and he is not on anticoagulation PTA.   Goal of Therapy:  Heparin level 0.3-0.7 units/ml Monitor platelets by anticoagulation protocol: Yes   Plan:  - Heparin bolus 4000 units IV x 1 - Heparin gtt 1000 units/hr - Check a 6 hour heparin level - Daily heparin level and CBC  Ronald House, Ronald House 11/24/2014,2:26 PM

## 2014-11-24 NOTE — ED Notes (Signed)
Pt here from [facility] cardiology with CP. Pt was having mechanical stress test when he became pale, cool, diaphoretic, increased pain during test. Pt sts substernal pressure 3/10 at this time. Pt was given nitro, zofran PTA. Pt took 81mg  ASA at home before the test. Pt sts he has been dealing with the CP for approx 6 weeks but during the stress test it got significantly worse.

## 2014-11-24 NOTE — ED Notes (Signed)
EKG completed given to EDP.  

## 2014-11-24 NOTE — ED Provider Notes (Signed)
CSN: [id number]     Arrival date & time 11/24/14  1330 History   First MD Initiated Contact with Patient 11/24/14 1331     Chief Complaint  Patient presents with  . Chest Pain     (Consider location/radiation/quality/duration/timing/severity/associated sxs/prior Treatment) HPI  The patient is a 69 year old male, he has a significant family history of heart disease with his brother who died of a heart problem in his mid 64s, the patient states that he has been having chest pain for over 6 weeks, it seems to be exertional, he does not exercise but even with small amounts of exertion he gets significant chest discomfort with left arm discomfort. He denies smoking cigarettes, denies any other significant medical problems, takes a baby aspirin daily. He reports being at the cardiology office today when he developed acute onset of chest pain while doing a stress test almost immediately. He became clammy and diaphoretic, became short of breath, the stress test was stopped immediately and he was sent to the hospital to be evaluated and admitted with a heart catheterization. At this time the patient has ongoing chest discomfort described as a heaviness  Past Medical History  Diagnosis Date  . Peyronie disease   . Prostate cancer   . Arthritis   . Undiagnosed cardiac murmurs     childhood/unknown if present issue  . Bulging lumbar disc   . Hematuria   . DDD (degenerative disc disease), lumbar    Past Surgical History  Procedure Laterality Date  . History of facial surgery  25 yrs ago    related to fracture  . History of hand repair  20 yrs ago    broken right index finger  . Radioactive seed implant  05/22/2011    Procedure: RADIOACTIVE SEED IMPLANT;  Surgeon: Ailene Rud, MD;  Location: [facility];  Service: Urology;  Laterality: N/A;  RAD TECH OK PER [facility]    Family History  Problem Relation Age of Onset  . Benign prostatic hyperplasia Father   . Cancer  Father     throat  . Heart disease Brother   . Colon cancer Neg Hx    Social History  Substance Use Topics  . Smoking status: Never Smoker   . Smokeless tobacco: Never Used  . Alcohol Use: 3.0 oz/week    5 Glasses of wine per week    Review of Systems  All other systems reviewed and are negative.     Allergies  Review of patient's allergies indicates no known allergies.  Home Medications   Prior to Admission medications   Medication Sig Start Date End Date Taking? Authorizing Provider  aspirin EC 81 MG EC tablet Take 1 tablet (81 mg total) by mouth daily. 01/14/13   Modena Jansky, MD  calcium carbonate (OS-CAL) 600 MG TABS Take 600 mg by mouth daily.     Historical Provider, MD  fish oil-omega-3 fatty acids 1000 MG capsule Take 2 g by mouth daily.     Historical Provider, MD  Multiple Vitamin (MULTIVITAMIN) tablet Take 1 tablet by mouth daily.     Historical Provider, MD  PRESCRIPTION MEDICATION Take 1 tablet by mouth 2 (two) times daily as needed (pain). Pain medication for back    Historical Provider, MD   BP 131/75 mmHg  Pulse 64  Temp(Src) 98.3 F (36.8 C) (Oral)  Resp 12  SpO2 95% Physical Exam  Constitutional: He appears well-developed and well-nourished. No distress.  HENT:  Head: Normocephalic  and atraumatic.  Mouth/Throat: Oropharynx is clear and moist. No oropharyngeal exudate.  Eyes: Conjunctivae and EOM are normal. Pupils are equal, round, and reactive to light. Right eye exhibits no discharge. Left eye exhibits no discharge. No scleral icterus.  Neck: Normal range of motion. Neck supple. No JVD present. No thyromegaly present.  Cardiovascular: Normal rate, regular rhythm, normal heart sounds and intact distal pulses.  Exam reveals no gallop and no friction rub.   No murmur heard. Pulmonary/Chest: Effort normal and breath sounds normal. No respiratory distress. He has no wheezes. He has no rales.  Abdominal: Soft. Bowel sounds are normal. He exhibits no  distension and no mass. There is no tenderness.  Musculoskeletal: Normal range of motion. He exhibits no edema or tenderness.  Lymphadenopathy:    He has no cervical adenopathy.  Neurological: He is alert. Coordination normal.  Skin: Skin is warm and dry. No rash noted. No erythema.  Psychiatric: He has a normal mood and affect. His behavior is normal.  Nursing note and vitals reviewed.   ED Course  Procedures (including critical care time) Labs Review Labs Reviewed  BASIC METABOLIC PANEL  CBC  APTT  PROTIME-INR    Imaging Review No results found. I have personally reviewed and evaluated these images and lab results as part of my medical decision-making.   EKG Interpretation   Date/Time:  Friday November 24 2014 13:36:15 EDT Ventricular Rate:  64 PR Interval:  140 QRS Duration: 85 QT Interval:  404 QTC Calculation: 417 R Axis:   59 Text Interpretation:  Sinus rhythm Borderline repolarization abnormality  Since last tracing rate slower Abnormal ekg Confirmed by Sabra Heck  MD, [place]  [id number]) on 11/24/2014 1:57:07 PM      MDM   Final diagnoses:  Acute coronary syndrome   The patient has a benign exam, there is no edema, heart sounds are normal, EKG has some nonspecific findings, he definitely is high risk for obstructive disease given his symptoms and the abnormal stress test and will need to be admitted to the hospital under the cardiology service most likely. We'll start nitroglycerin drip, heparin drip, consult with cardiology.  VS normal - heparin drip and Nitro gtt ordered  dw Dr. Einar Gip who will take to cath this afternoon  CRITICAL CARE Performed by: Johnna Acosta Total critical care time: 35 Critical care time was exclusive of separately billable procedures and treating other patients. Critical care was necessary to treat or prevent imminent or life-threatening deterioration. Critical care was time spent personally by me on the following activities:  development of treatment plan with patient and/or surrogate as well as nursing, discussions with consultants, evaluation of patient's response to treatment, examination of patient, obtaining history from patient or surrogate, ordering and performing treatments and interventions, ordering and review of laboratory studies, ordering and review of radiographic studies, pulse oximetry and re-evaluation of patient's condition.     Noemi Chapel, MD 11/24/14 [phone]

## 2014-11-24 NOTE — Interval H&P Note (Signed)
History and Physical Interval Note:  11/24/2014 3:36 PM  Ronald House  has presented today for surgery, with the diagnosis of unstable angina  The various methods of treatment have been discussed with the patient and family. After consideration of risks, benefits and other options for treatment, the patient has consented to  Procedure(s): Left Heart Cath and Coronary Angiography (N/A) and possible PCI  as a surgical intervention .  The patient's history has been reviewed, patient examined, no change in status, stable for surgery.  I have reviewed the patient's chart and labs.  Questions were answered to the patient's satisfaction.   Cath Lab Visit (complete for each Cath Lab visit)  Clinical Evaluation Leading to the Procedure:   ACS: Yes.    Non-ACS:    Anginal Classification: CCS IV  Anti-ischemic medical therapy: No Therapy  Non-Invasive Test Results: High-risk stress test findings: cardiac mortality >3%/year  Prior CABG: No previous CABG        Einar Gip, Keniya Schlotterbeck

## 2014-11-24 NOTE — H&P (Signed)
Ronald House is an 69 y.o. male.   Chief Complaint:  Chest pain HPI: Ronald House 69 year old Caucasian male with no significant prior cardiac vascular history, history of prostate cancer, referred initially for evaluation of chest pain described as substernal chest discomfort with radiation to the left arm.  Episodes lasting less than a minute, no clear-cut exertional complement.  He has history of questionable TIA in the past been 2014, when he had an episode of word confabulation lasting a few seconds.  Emergency room visit at that time had revealed no acute deficits.  He was found to have less than 1 - 39% bilateral carotid artery stenosis at that time.  I had seen him on 11/17/2014, set him up for a routine treadmill excess stress test.  This morning he underwent treadmill stress testing in my office, during exercise he developed transient paroxysmal episodes of atrial tachycardia but also developed ST segment depression in inferior and lateral leads associated with chest pain, marked diaphoresis.  Chest pain did improve with sublingual nitroglycerin but he persisted to have chest discomfort which is considered to be mild with normalization of EKG.  Due to persistent chest discomfort in spite of waiting for 10 minutes and having received sublingual nitroglycerin, he was recommended to be admitted to the hospital.  Patient still has very mild chest tightness that he states that has been lingering on.  Otherwise feels well.  Wife present at the bedside.  Past Medical History  Diagnosis Date  . Peyronie disease   . Prostate cancer   . Arthritis   . Undiagnosed cardiac murmurs     childhood/unknown if present issue  . Bulging lumbar disc   . Hematuria   . DDD (degenerative disc disease), lumbar     Past Surgical History  Procedure Laterality Date  . History of facial surgery  25 yrs ago    related to fracture  . History of hand repair  20 yrs ago    broken right index finger  . Radioactive  seed implant  05/22/2011    Procedure: RADIOACTIVE SEED IMPLANT;  Surgeon: Ailene Rud, MD;  Location: [facility];  Service: Urology;  Laterality: N/A;  RAD TECH OK PER [facility]     Family History  Problem Relation Age of Onset  . Benign prostatic hyperplasia Father   . Cancer Father     throat  . Heart disease Brother   . Colon cancer Neg Hx    Social History:  reports that he has never smoked. He has never used smokeless tobacco. He reports that he drinks about 3.0 oz of alcohol per week. He reports that he does not use illicit drugs.  Allergies: No Known Allergies  Review of Systems - Negative except chest pain  Blood pressure 130/78, pulse 64, temperature 98.3 F (36.8 C), temperature source Oral, resp. rate 18, SpO2 97 %. General appearance: alert, cooperative, appears stated age and no distress Eyes: negative findings: lids and lashes normal Neck: no adenopathy, no carotid bruit, no JVD, supple, symmetrical, trachea midline and thyroid not enlarged, symmetric, no tenderness/mass/nodules Neck: JVP - normal, carotids 2+= without bruits Resp: clear to auscultation bilaterally Chest wall: no tenderness Cardio: regular rate and rhythm, S1, S2 normal, no murmur, click, rub or gallop GI: soft, non-tender; bowel sounds normal; no masses,  no organomegaly Extremities: extremities normal, atraumatic, no cyanosis or edema Pulses: 2+ and symmetric Skin: Skin color, texture, turgor normal. No rashes or lesions Neurologic: Grossly  normal  Results for orders placed or performed during the hospital encounter of 11/24/14 (from the past 48 hour(s))  Basic metabolic panel     Status: Abnormal   Collection Time: 11/24/14  2:00 PM  Result Value Ref Range   Sodium 137 135 - 145 mmol/L   Potassium 4.4 3.5 - 5.1 mmol/L   Chloride 104 101 - 111 mmol/L   CO2 25 22 - 32 mmol/L   Glucose, Bld 130 (H) 65 - 99 mg/dL   BUN 14 6 - 20 mg/dL   Creatinine, Ser 1.25 (H) 0.61  - 1.24 mg/dL   Calcium 9.0 8.9 - 10.3 mg/dL   GFR calc non Af Amer 57 (L) >60 mL/min   GFR calc Af Amer >60 >60 mL/min    Comment: (NOTE) The eGFR has been calculated using the CKD EPI equation. This calculation has not been validated in all clinical situations. eGFR's persistently <60 mL/min signify possible Chronic Kidney Disease.    Anion gap 8 5 - 15  CBC     Status: None   Collection Time: 11/24/14  2:00 PM  Result Value Ref Range   WBC 9.9 4.0 - 10.5 K/uL   RBC 5.00 4.22 - 5.81 MIL/uL   Hemoglobin 14.7 13.0 - 17.0 g/dL   HCT 45.3 39.0 - 52.0 %   MCV 90.6 78.0 - 100.0 fL   MCH 29.4 26.0 - 34.0 pg   MCHC 32.5 30.0 - 36.0 g/dL   RDW 13.0 11.5 - 15.5 %   Platelets 209 150 - 400 K/uL  APTT     Status: None   Collection Time: 11/24/14  2:00 PM  Result Value Ref Range   aPTT 27 24 - 37 seconds  Protime-INR     Status: None   Collection Time: 11/24/14  2:00 PM  Result Value Ref Range   Prothrombin Time 14.2 11.6 - 15.2 seconds   INR 1.08 0.00 - 1.49  I-stat troponin, ED     Status: None   Collection Time: 11/24/14  2:14 PM  Result Value Ref Range   Troponin i, poc 0.03 0.00 - 0.08 ng/mL   Comment 3            Comment: Due to the release kinetics of cTnI, a negative result within the first hours of the onset of symptoms does not rule out myocardial infarction with certainty. If myocardial infarction is still suspected, repeat the test at appropriate intervals.    Dg Chest Port 1 View  11/24/2014   CLINICAL DATA:  Chest pain, shortness of breath and weakness  EXAM: PORTABLE CHEST - 1 VIEW  COMPARISON:  12/01/2013  FINDINGS: The heart size and mediastinal contours are within normal limits. Both lungs are clear. The visualized skeletal structures are unremarkable.  IMPRESSION: No active disease.   Electronically Signed   By: Conchita Paris M.D.   On: 11/24/2014 14:29    Labs:   Lab Results  Component Value Date   WBC 9.9 11/24/2014   HGB 14.7 11/24/2014   HCT 45.3  11/24/2014   MCV 90.6 11/24/2014   PLT 209 11/24/2014    Recent Labs Lab 11/24/14 1400  NA 137  K 4.4  CL 104  CO2 25  BUN 14  CREATININE 1.25*  CALCIUM 9.0  GLUCOSE 130*    Lipid Panel     Component Value Date/Time   CHOL 171 01/14/2013 0435   TRIG 70 01/14/2013 0435   HDL 59 01/14/2013 0435   CHOLHDL  2.9 01/14/2013 0435   VLDL 14 01/14/2013 0435   LDLCALC 98 01/14/2013 0435    HEMOGLOBIN A1C Lab Results  Component Value Date   HGBA1C 5.7* 01/14/2013   MPG 117* 01/14/2013    Cardiac Panel (last 3 results)  Recent Labs  12/02/13 0104 12/02/13 0300  TROPONINI <0.30 <0.30    Lab Results  Component Value Date   TROPONINI <0.30 12/02/2013     EKG 11/24/2014: Sinus bradycardia at the rate of 64 bpm, otherwise normal EKG.  Outpatient stress testing Treadmill Exercise stress 11/24/2014: Indication: Chest pain The patient exercised according to Bruce Protocol, Total time recorded 08:29 min achieving max heart rate of 150 which was 98% of MPHR for age and 10.16 METS of work. Normal BP response. There was 50m ST depression noted at peak exercise that persisted for > 2 minutes into recovery and suggestive of ischemia. He also had episodes of non sustained atrial tachycardia. Patient developed marked diaphoresis and chest pain which was partially relieved. Stress terminated due to THR (>85% MPHR)/MPHR met.    Current facility-administered medications:  .  heparin 5000 UNIT/ML injection, , , ,  .  heparin ADULT infusion 100 units/mL (25000 units/250 mL), 1,000 Units/hr, Intravenous, Continuous, BNoemi Chapel MD  Current outpatient prescriptions:  .  aspirin EC 81 MG EC tablet, Take 1 tablet (81 mg total) by mouth daily., Disp: 30 tablet, Rfl: 0 .  calcium carbonate (OS-CAL) 600 MG TABS, Take 600 mg by mouth daily. , Disp: , Rfl:  .  fish oil-omega-3 fatty acids 1000 MG capsule, Take 2 g by mouth daily. , Disp: , Rfl:  .  Multiple Vitamin (MULTIVITAMIN)  tablet, Take 1 tablet by mouth daily. , Disp: , Rfl:  .  PRESCRIPTION MEDICATION, Take 1 tablet by mouth 2 (two) times daily as needed (pain). Pain medication for back, Disp: , Rfl:   Assessment/Plan 1.  Unstable angina pectoris with abnormal stress test with patient's symptoms of chest discomfort that is lingering since stress testing and ST segment changes in the inferior and lateral leads suggestive of ischemia.  Patient also developed marked diaphoresis, fatigue, dizziness.  Nitrate responsive chest pain.  Outpatient labs from PCP:  Labs 01/14/2013: Total cholesterol 171, triglycerides 70, HDL 59, LDL 98, HbA1c 5.7%, CMP normal, CBC normal 2.  History of TIA (transient ischemic attack) very doubtful Symtoms lasted 2-3 seconds[place]. 3. Arteriosclerosis of both carotid arteries  Story: Carotid duplex 01/14/2013: The vertebral arteries appear patent with antegrade flow. Findings consistent with 1- 39% stenosis involving the right internal carotid artery and the left internal carotid artery.  Recommendation: Given ongoing symptoms, I have recommended admission to the hospital.  He'll be ruled out for myocardial infarction, he'll be set up for coronary angina free. Discussed risks, benefits and alternatives of angiogram including but not limited to <1% risk of death, stroke, MI, need for urgent surgical revascularization, renal failure, but not limited to thest. patient is willing to proceed. Wife present at the bedside and all questions answered.   GAdrian Prows MD 11/24/2014, 2:36 PM PMount [facility]. [place]Pager: [phone] Office: [phone] no answer: Cell:  [phone]

## 2014-11-25 DIAGNOSIS — I200 Unstable angina: Secondary | ICD-10-CM | POA: Diagnosis not present

## 2014-11-25 DIAGNOSIS — I214 Non-ST elevation (NSTEMI) myocardial infarction: Secondary | ICD-10-CM | POA: Diagnosis not present

## 2014-11-25 LAB — BASIC METABOLIC PANEL
ANION GAP: 7 (ref 5–15)
BUN: 12 mg/dL (ref 6–20)
CO2: 24 mmol/L (ref 22–32)
Calcium: 8.4 mg/dL — ABNORMAL LOW (ref 8.9–10.3)
Chloride: 105 mmol/L (ref 101–111)
Creatinine, Ser: 1.16 mg/dL (ref 0.61–1.24)
GFR calc Af Amer: >60 mL/min (ref >60)
Glucose, Bld: 153 mg/dL — ABNORMAL HIGH (ref 65–99)
POTASSIUM: 4.1 mmol/L (ref 3.5–5.1)
SODIUM: 136 mmol/L (ref 135–145)

## 2014-11-25 LAB — CBC
HCT: 41.6 % (ref 39.0–52.0)
HEMOGLOBIN: 13.7 g/dL (ref 13.0–17.0)
MCH: 30 pg (ref 26.0–34.0)
MCHC: 32.9 g/dL (ref 30.0–36.0)
MCV: 91.2 fL (ref 78.0–100.0)
PLATELETS: 184 10*3/uL (ref 150–400)
RBC: 4.56 MIL/uL (ref 4.22–5.81)
RDW: 13.4 % (ref 11.5–15.5)
WBC: 7.6 10*3/uL (ref 4.0–10.5)

## 2014-11-25 LAB — TROPONIN I: TROPONIN I: 0.22 ng/mL — AB (ref <0.031)

## 2014-11-25 MED ORDER — ATORVASTATIN CALCIUM 80 MG PO TABS: 80.0000 mg | ORAL_TABLET | Freq: Every day | ORAL | Status: DC

## 2014-11-25 MED ORDER — CARVEDILOL 3.125 MG PO TABS: 3.1250 mg | ORAL_TABLET | Freq: Two times a day (BID) | ORAL | Status: DC

## 2014-11-25 MED ORDER — NITROGLYCERIN 0.4 MG SL SUBL: 0.4000 mg | SUBLINGUAL_TABLET | SUBLINGUAL | Status: DC | PRN

## 2014-11-25 MED ORDER — ANGIOPLASTY BOOK
Freq: Once | Status: DC
  Filled 2014-11-25: qty 1

## 2014-11-25 MED ORDER — TICAGRELOR 90 MG PO TABS: 90.0000 mg | ORAL_TABLET | Freq: Two times a day (BID) | ORAL | Status: DC

## 2014-11-25 NOTE — Progress Notes (Signed)
CARDIAC REHAB PHASE I   PRE:  Rate/Rhythm: 70 SR   BP:  Supine:   Sitting: 164/93  Standing:    SaO2: 95 RA  MODE:  Ambulation: 600 ft   POST:  Rate/Rhythm: 77 SR  BP:  Supine:   Sitting: 149/80  Standing:    SaO2: 95 RA Patient tolerated ambulation well with difficulty or angina.  Education completed regarding angina symptoms, NTG usage, when to call the doctor or 911, activity progression, nutrition tips, cath site care and activity limitations, home exercise, and referred to phase II cardiac rehab @ [facility].  He is retiring from [employer] after this current semester and will be able to participate after he retires.   0800-0900  Liliane Channel RN, BSN 11/25/2014 8:52 AM   6

## 2014-11-25 NOTE — Discharge Summary (Signed)
Physician Discharge Summary  Patient ID: WAH SABIC MRN: [id number] DOB/AGE: 08/13/1945 69 y.o.  Admit date: 11/24/2014 Discharge date: 11/25/2014  Primary Discharge Diagnosis: NSTEMI, CAD of native artery s/p PTCA and stenting  Significant Diagnostic Studies: Coronary angiogram 11/24/2014: Mid RCA lesion, 50% stenosed. Prox LAD to Mid LAD lesion, 10% stenosed.  Prox Cx to Mid Cx lesion, 99% stenosed. S/P stenting with 4.0x22 mm Resolute DES. There is a 0% residual stenosis post intervention. Normal LV systolic function, EF 91-47%   Hospital Course: Ronald House 69 year old Caucasian male with no significant prior cardiovascular history, history of prostate cancer, referred initially for evaluation of chest pain described as substernal chest discomfort with radiation to the left arm. Episodes lasting less than a minute, no clear-cut exertional complement. He has history of questionable TIA in the past been 2014, when he had an episode of word confabulation lasting a few seconds. Emergency room visit at that time had revealed no acute deficits. He was found to have less than 1 - 39% bilateral carotid artery stenosis at that time.  He had been seen on 11/17/2014, set him up for a routine treadmill excess stress test. Yesterday, he underwent treadmill stress testing in our office. During exercise, he developed transient paroxysmal episodes of atrial tachycardia but also developed ST segment depression in inferior and lateral leads associated with chest pain and marked diaphoresis. Chest pain did improve with sublingual nitroglycerin but he persisted to have chest discomfort which was considered to be mild with normalization of EKG. Due to persistent chest discomfort in spite of waiting for 10 minutes and having received sublingual nitroglycerin, he was recommended to be admitted to the hospital.  He underwent coronary angiogram yesterday afternoon which revealed Prox Cx to Mid Cx lesion, 99%  stenosed. He underwent successful stenting with 4.0x22 mm Resolute DES. There is a 0% residual stenosis post intervention.   Recommendations on discharge: Continue DAPT with ASA and Brilinta due to ACS presentation. Continue atorvastatin 80mg  and Coreg for cardioprotection.  Follow up outpatient in 1 week.    Discharge Exam: Blood pressure 149/80, pulse 60, temperature 97.8 F (36.6 C), temperature source Oral, resp. rate 18, weight 83 kg (182 lb 15.7 oz), SpO2 94 %.    General appearance: alert, cooperative, appears stated age and no distress Eyes: negative findings: lids and lashes normal Neck: no adenopathy, no carotid bruit, no JVD, supple, symmetrical, trachea midline and thyroid not enlarged, symmetric, no tenderness/mass/nodules Resp: clear to auscultation bilaterally Chest wall: no tenderness Cardio: regular rate and rhythm, S1, S2 normal, no murmur, click, rub or gallop GI: soft, non-tender; bowel sounds normal; no masses, no organomegaly Extremities: extremities normal, atraumatic, no cyanosis or edema Pulses: 2+ and symmetric Skin: Skin color, texture, turgor normal. No rashes or lesions Neurologic: Grossly normal  Labs:   Lab Results  Component Value Date   WBC 7.6 11/25/2014   HGB 13.7 11/25/2014   HCT 41.6 11/25/2014   MCV 91.2 11/25/2014   PLT 184 11/25/2014     Recent Labs Lab 11/25/14 0801  NA 136  K 4.1  CL 105  CO2 24  BUN 12  CREATININE 1.16  CALCIUM 8.4*  GLUCOSE 153*    Lipid Panel     Component Value Date/Time   CHOL 171 01/14/2013 0435   TRIG 70 01/14/2013 0435   HDL 59 01/14/2013 0435   CHOLHDL 2.9 01/14/2013 0435   VLDL 14 01/14/2013 0435   LDLCALC 98 01/14/2013 0435    BNP (  last 3 results) No results for input(s): BNP in the last 8760 hours.  ProBNP (last 3 results) No results for input(s): PROBNP in the last 8760 hours.  HEMOGLOBIN A1C Lab Results  Component Value Date   HGBA1C 5.7* 01/14/2013   MPG 117* 01/14/2013     Cardiac Panel (last 3 results)  Recent Labs  12/02/13 0300 11/24/14 1855 11/25/14 0801  TROPONINI <0.30 0.15* 0.22*    Lab Results  Component Value Date   TROPONINI 0.22* 11/25/2014     TSH No results for input(s): TSH in the last 8760 hours.  EKG 11/25/2014: sinus bradycardia at a rate of 53 bpm, normal axis, normal intervals, U waves present, no evidence of ischemia.   Radiology: Dg Chest Port 1 View  11/24/2014   CLINICAL DATA:  Chest pain, shortness of breath and weakness  EXAM: PORTABLE CHEST - 1 VIEW  COMPARISON:  12/01/2013  FINDINGS: The heart size and mediastinal contours are within normal limits. Both lungs are clear. The visualized skeletal structures are unremarkable.  IMPRESSION: No active disease.   Electronically Signed   By: Conchita Paris M.D.   On: 11/24/2014 14:29      FOLLOW UP PLANS AND APPOINTMENTS    Medication List    TAKE these medications        aspirin 81 MG EC tablet  Take 1 tablet (81 mg total) by mouth daily.     atorvastatin 80 MG tablet  Commonly known as:  LIPITOR  Take 1 tablet (80 mg total) by mouth daily at 6 PM.     calcium carbonate 600 MG Tabs tablet  Commonly known as:  OS-CAL  Take 600 mg by mouth daily.     carvedilol 3.125 MG tablet  Commonly known as:  COREG  Take 1 tablet (3.125 mg total) by mouth 2 (two) times daily with a meal.     fish oil-omega-3 fatty acids 1000 MG capsule  Take 1 g by mouth daily.     multivitamin tablet  Take 1 tablet by mouth daily.     nitroGLYCERIN 0.4 MG SL tablet  Commonly known as:  NITROSTAT  Place 1 tablet (0.4 mg total) under the tongue every 5 (five) minutes x 3 doses as needed for chest pain.     omeprazole 40 MG capsule  Commonly known as:  PRILOSEC  Take 40 mg by mouth daily.     ticagrelor 90 MG Tabs tablet  Commonly known as:  BRILINTA  Take 1 tablet (90 mg total) by mouth 2 (two) times daily.     TIGER BALM MUSCLE RUB EX  Apply 1 application topically daily as  needed. For back pain           Follow-up Information    Follow up with Adrian Prows, MD On 12/04/2014.   Specialty:  Cardiology   Why:  office will call with time   Contact information:   [address] [place] [address] [phone]        Rachel Bo, NP-C 11/25/2014, 6:52 AM [facility] Pager: [phone] Office: [phone]

## 2014-11-25 NOTE — Discharge Instructions (Signed)
Coronary Angiogram with Stent °Coronary angiography with stent placement is a procedure to widen or open a narrow blood vessel of the heart (coronary artery). When a coronary artery becomes partially blocked, it decreases blood flow to that area. This may lead to chest pain or a heart attack (myocardial infarction). Arteries may become blocked by cholesterol buildup (plaque) in the lining or wall.  °A stent is a small piece of metal that looks like a mesh or a spring. Stent placement may be done right after a coronary angiography in which a blocked artery is found or as a treatment for a heart attack.  °LET YOUR HEALTH CARE PROVIDER KNOW ABOUT: °· Any allergies you have.   °· All medicines you are taking, including vitamins, herbs, eye drops, creams, and over-the-counter medicines.   °· Previous problems you or members of your family have had with the use of anesthetics.   °· Any blood disorders you have.   °· Previous surgeries you have had.   °· Medical conditions you have. °RISKS AND COMPLICATIONS °Generally, coronary angiography with stent is a safe procedure. However, problems can occur and include: °· Damage to the heart or its blood vessels.   °· A return of blockage.   °· Bleeding, infection, or bruising at the insertion site.   °· A collection of blood under the skin (hematoma) at the insertion site. °· Blood clot in another part of the body.   °· Kidney injury.   °· Allergic reaction to the dye or contrast used.   °· Bleeding into the abdomen (retroperitoneal bleeding). °BEFORE THE PROCEDURE °· Do not eat or drink anything after midnight on the night before the procedure or as directed by your health care provider.  °· Ask your health care provider about changing or stopping your regular medicines. This is especially important if you are taking diabetes medicines or blood thinners. °· Your health care provider will make sure you understand the procedure as well as the risks and potential problems  associated with the procedure.   °PROCEDURE °· You may be given a medicine to help you relax before and during the procedure (sedative). This medicine will be given through an IV tube that is put into one of your veins.   °· The area where the catheter will be inserted will be shaved and cleaned. This is usually done in the groin but may be done in the fold of your arm (near your elbow) or in the wrist.    °· A medicine will be given to numb the area where the catheter will be inserted (local anesthetic).   °· The catheter will be inserted into an artery using a guide wire. A type of X-ray (fluoroscopy) will be used to help guide the catheter to the opening of the blocked artery.   °· A dye will then be injected into the catheter, and X-rays will be taken. The dye will help to show where any narrowing or blockages are located in the heart arteries.   °· A tiny wire will be guided to the blocked spot, and a balloon will be inflated to make the artery wider. The stent will be expanded and will crush the plaque into the wall of the vessel. The stent will hold the area open like a scaffolding and improve the blood flow.   °· Sometimes the artery may be made wider using a laser or other tools to remove plaque.   °· When the blood flow is better, the catheter will be removed. The lining of the artery will grow over the stent, which stays where it was placed.   °  AFTER THE PROCEDURE  If the procedure is done through the leg, you will be kept in bed lying flat for about 6 hours. You will be instructed to not bend or cross your legs.   The insertion site will be checked frequently.   The pulse in your feet or wrist will be checked frequently.   Additional blood tests, X-rays, and electrocardiography may be done. Document Released: 09/07/2002 Document Revised: 07/18/2013 Document Reviewed: 09/09/2012 [facility] Patient Information 2015 [place], [place]. This information is not intended to replace advice given to you  by your health care provider. Make sure you discuss any questions you have with your health care provider.  Cardiac Rehabilitation Cardiac rehabilitation is a medically supervised program that helps improve the health and well-being of people with heart problems. Cardiac rehabilitation includes exercise training, education, and counseling to help you get stronger and return to an active lifestyle. People who participate in cardiac rehabilitation programs get better faster and reduce future hospital stays. Cardiac rehabilitation programs can help when you have had the following conditions:  Heart attack.  Heart failure.  Peripheral artery disease.  Coronary artery disease.  Angina.  Lung or breathing problems. Cardiac rehabilitation programs are also used when you have the following procedures:  Coronary artery bypass graft surgery.  Heart valve replacement.  Heart stent placement.  Heart transplant.  Aneurysm repair. CARDIAC REHABILITATION MAY HELP YOU:  Reduce problems like chest pain and trouble breathing.  Change risk factors that contribute to heart disease, such as:  Smoking.  High blood pressure.  High cholesterol.  Diabetes.  Being out of shape or not active.  Weighing more than 30% over your ideal weight.  Diet.  Improve your mental outlook so you feel:  Less depressed or "blue."  More hopeful.  Better about yourself.  More confident about taking care of yourself.  Get support from health experts as well as other people with similar problems.  Learn how to manage and understand your medicines.  Teach your family about your condition and how to participate in your recovery. WHAT HAPPENS IN CARDIAC REHABILITATION? You will be assessed by a cardiac rehabilitation team. They will check your health history and do a physical exam. You may need blood tests, stress tests, and other evaluations. You may not start a cardiac rehabilitation program  if:  You develop angina with exercise or while at rest.  You have severe heart failure that limits your activity.  You have an abnormal heart rhythm at rest.  You develop heart rhythm problems during exercise.  You have high blood pressure that is not controlled. The cardiac rehabilitation team works with you to make a plan based on your health and goals. Everyone is unique, so each program is customized and your program may change as you progress. Members of a typical cardiac rehabilitation team may include such health professionals as:  Doctors.  Nurses.  Dietitians.  Psychologists.  Exercise specialists.  Physical and occupational therapists. A typical cardiac rehabilitation program is divided into phases. You advance from one phase to the next. Most cardiac rehabilitation sessions last for 60 minutes, 3 times a week.  Phase One starts while you are still in the hospital. You may start by walking in your room and then in the hall. You may start some simple exercises with a therapist. Health care team members will give you information and ask you many questions. You may not be able to remember details, so have a family member or an advocate with  you to help keep track of information.  Phase Two begins when you go home or to another facility. This phase may last 8 to 12 weeks. You will travel to a cardiac rehabilitation center or a place where it is offered. Typically, you gradually increase your activity while being closely watched by a nurse or therapist. Exercises may be a combination of strength or resistance training and "cardio" or aerobic movement on a treadmill or other machines. Your condition will determine how often and how long these sessions will last.  In phase two, you may learn how to cook healthy meals, control your blood sugar, and manage your medicines. You may need help with scheduling or planning how and when to take your medicines. Use a timer, divided pill box, or  follow a form to make taking your medicines easier. Use the method that works best for you. Some medicines should not be taken with certain foods. If you take more than one blood pressure medicine, you may need to stagger the times you take them. Taking all your blood pressure medicine at the same time may lower your blood pressure too much. If you have questions about your medicines, ask your health care provider questions until you understand.  Phase Three continues for the rest of your life. There will be less supervision. You may still participate in cardiac rehabilitation activities or become part of a group in your community. You may benefit from talking to other people about your experience if they are facing similar challenges. How soon you drive, have sex, or return to work will depend on your condition. These decisions should be made by you and your health care provider. If you need help, ask for it. Find out where you can get the help you need. Ask questions until you get answers and understand. SEEK IMMEDIATE MEDICAL CARE IF:  Get medical help at once if you experience any of the following symptoms:  Severe chest discomfort, especially if the pain is crushing or pressure-like and spreads to the arms, back, neck, or jaw. Do not wait to see if the pain will go away.  Weakness or numbness in your face, arms, or legs, especially on one side of the body; slurred speech; confusion; sudden severe headache or loss of vision (all symptoms of stroke).  You have shortness of breath.  You are sweating and feel sick to your stomach (nausea).  You feel dizzy or faint.  You experience profound tiredness (fatigue). Call your local emergency service (911 in the [place] Do not drive yourself to the hospital. Document Released: 12/11/2007 Document Revised: 07/18/2013 Document Reviewed: 06/07/2010 [facility] Patient Information 2015 [place], [place]. This information is not intended to replace advice given to  you by your health care provider. Make sure you discuss any questions you have with your health care provider.

## 2014-11-27 ENCOUNTER — Encounter (HOSPITAL_COMMUNITY): Payer: Self-pay | Admitting: Cardiology

## 2014-11-27 LAB — HEMOGLOBIN A1C
HEMOGLOBIN A1C: 5.7 % — AB (ref 4.8–5.6)
MEAN PLASMA GLUCOSE: 117 mg/dL

## 2014-11-30 DIAGNOSIS — I6523 Occlusion and stenosis of bilateral carotid arteries: Secondary | ICD-10-CM | POA: Diagnosis not present

## 2014-12-05 DIAGNOSIS — I2510 Atherosclerotic heart disease of native coronary artery without angina pectoris: Secondary | ICD-10-CM | POA: Diagnosis not present

## 2014-12-05 DIAGNOSIS — Z9861 Coronary angioplasty status: Secondary | ICD-10-CM | POA: Diagnosis not present

## 2014-12-05 DIAGNOSIS — I6523 Occlusion and stenosis of bilateral carotid arteries: Secondary | ICD-10-CM | POA: Diagnosis not present

## 2014-12-25 DIAGNOSIS — E785 Hyperlipidemia, unspecified: Secondary | ICD-10-CM | POA: Diagnosis not present

## 2014-12-25 DIAGNOSIS — M810 Age-related osteoporosis without current pathological fracture: Secondary | ICD-10-CM | POA: Diagnosis not present

## 2014-12-25 DIAGNOSIS — Z125 Encounter for screening for malignant neoplasm of prostate: Secondary | ICD-10-CM | POA: Diagnosis not present

## 2014-12-29 DIAGNOSIS — Z0000 Encounter for general adult medical examination without abnormal findings: Secondary | ICD-10-CM | POA: Diagnosis not present

## 2014-12-29 DIAGNOSIS — Z23 Encounter for immunization: Secondary | ICD-10-CM | POA: Diagnosis not present

## 2015-01-05 DIAGNOSIS — E785 Hyperlipidemia, unspecified: Secondary | ICD-10-CM | POA: Diagnosis not present

## 2015-01-05 DIAGNOSIS — I5032 Chronic diastolic (congestive) heart failure: Secondary | ICD-10-CM | POA: Diagnosis not present

## 2015-01-05 DIAGNOSIS — N182 Chronic kidney disease, stage 2 (mild): Secondary | ICD-10-CM | POA: Diagnosis not present

## 2015-01-05 DIAGNOSIS — I129 Hypertensive chronic kidney disease with stage 1 through stage 4 chronic kidney disease, or unspecified chronic kidney disease: Secondary | ICD-10-CM | POA: Diagnosis not present

## 2015-02-15 DIAGNOSIS — I2510 Atherosclerotic heart disease of native coronary artery without angina pectoris: Secondary | ICD-10-CM | POA: Diagnosis not present

## 2015-02-28 DIAGNOSIS — I6523 Occlusion and stenosis of bilateral carotid arteries: Secondary | ICD-10-CM | POA: Diagnosis not present

## 2015-02-28 DIAGNOSIS — Z9861 Coronary angioplasty status: Secondary | ICD-10-CM | POA: Diagnosis not present

## 2015-02-28 DIAGNOSIS — I2510 Atherosclerotic heart disease of native coronary artery without angina pectoris: Secondary | ICD-10-CM | POA: Diagnosis not present

## 2015-05-02 ENCOUNTER — Telehealth (HOSPITAL_COMMUNITY): Payer: Self-pay | Admitting: Cardiac Rehabilitation

## 2015-05-02 NOTE — Telephone Encounter (Signed)
pc to pt to discuss enrolling in cardiac rehab. Pt declined.  Pt is exercising on his own.

## 2015-08-25 ENCOUNTER — Emergency Department (HOSPITAL_COMMUNITY)
Admission: EM | Admit: 2015-08-25 | Discharge: 2015-08-25 | Disposition: A | Discharge status: Home / Self Care | Payer: Medicare Other | Attending: Emergency Medicine | Admitting: Emergency Medicine

## 2015-08-25 ENCOUNTER — Emergency Department (HOSPITAL_COMMUNITY): Payer: Medicare Other

## 2015-08-25 ENCOUNTER — Encounter (HOSPITAL_COMMUNITY): Payer: Self-pay | Admitting: Emergency Medicine

## 2015-08-25 DIAGNOSIS — Y999 Unspecified external cause status: Secondary | ICD-10-CM | POA: Insufficient documentation

## 2015-08-25 DIAGNOSIS — Y939 Activity, unspecified: Secondary | ICD-10-CM | POA: Diagnosis not present

## 2015-08-25 DIAGNOSIS — W540XXA Bitten by dog, initial encounter: Secondary | ICD-10-CM | POA: Diagnosis not present

## 2015-08-25 DIAGNOSIS — Z7982 Long term (current) use of aspirin: Secondary | ICD-10-CM | POA: Diagnosis not present

## 2015-08-25 DIAGNOSIS — Y929 Unspecified place or not applicable: Secondary | ICD-10-CM | POA: Diagnosis not present

## 2015-08-25 DIAGNOSIS — I2510 Atherosclerotic heart disease of native coronary artery without angina pectoris: Secondary | ICD-10-CM | POA: Insufficient documentation

## 2015-08-25 DIAGNOSIS — Z8546 Personal history of malignant neoplasm of prostate: Secondary | ICD-10-CM | POA: Diagnosis not present

## 2015-08-25 DIAGNOSIS — S61451A Open bite of right hand, initial encounter: Secondary | ICD-10-CM | POA: Insufficient documentation

## 2015-08-25 DIAGNOSIS — M479 Spondylosis, unspecified: Secondary | ICD-10-CM | POA: Diagnosis not present

## 2015-08-25 MED ORDER — TETANUS-DIPHTH-ACELL PERTUSSIS 5-2.5-18.5 LF-MCG/0.5 IM SUSP
0.5000 mL | Freq: Once | INTRAMUSCULAR | Status: AC
  Administered 2015-08-25: 0.5 mL via INTRAMUSCULAR
  Filled 2015-08-25: qty 0.5

## 2015-08-25 MED ORDER — AMOXICILLIN-POT CLAVULANATE 875-125 MG PO TABS
1.0000 | ORAL_TABLET | Freq: Once | ORAL | Status: AC
  Administered 2015-08-25: 1 via ORAL
  Filled 2015-08-25: qty 1

## 2015-08-25 MED ORDER — AMOXICILLIN-POT CLAVULANATE 875-125 MG PO TABS: 1.0000 | ORAL_TABLET | Freq: Two times a day (BID) | ORAL | Status: DC

## 2015-08-25 NOTE — Discharge Instructions (Signed)

## 2015-08-25 NOTE — ED Provider Notes (Signed)
CSN: [id number]     Arrival date & time 08/25/15  2154 History  By signing my name below, I, Dora Sims, attest that this documentation has been prepared under the direction and in the presence of non-physician practitioner, Montine Circle, PA-C . Electronically Signed: Dora Sims, Scribe. 08/25/2015. 10:17 PM.   Chief Complaint  Patient presents with  . Animal Bite    The history is provided by the patient. No language interpreter was used.     Ronald House is a 70 y.o. male who presents to the Emergency Department with a chief complaint of dog bite sustained to his right hand a few hours ago. Pt states that he was breaking up a fight between his two dogs and was bitten on his right hand; he notes several small puncture wounds. Pt is unsure of which dog bit him but states that both are UTD for vaccines. Pt is unsure of his tetanus status. He reports that he uses blood thinners. He has applied a bandage to his right hand and notes that his bleeding is currently controlled. Pt has no known allergies to medications. Pt denies pain in his right hand, numbness, sensation loss, or any other associated symptoms.  Past Medical History  Diagnosis Date  . Peyronie disease   . Prostate cancer ([place])   . Undiagnosed cardiac murmurs     childhood/unknown if present issue  . Bulging lumbar disc   . Hematuria   . GERD (gastroesophageal reflux disease)   . Coronary artery disease   . Arthritis     "lower back" (11/24/2014)  . DDD (degenerative disc disease), lumbar   . Chronic lower back pain   . Anxiety    Past Surgical History  Procedure Laterality Date  . Open treatment zygomatic arch fracture  ~ 1980  . Finger fracture surgery Right 1980's    index finger  . Radioactive seed implant  05/22/2011    Procedure: RADIOACTIVE SEED IMPLANT;  Surgeon: Ailene Rud, MD;  [facility];  Service: Urology;  Laterality: N/A;  RAD TECH OK PER [facility]   . Coronary  angioplasty with stent placement  11/24/2014    "1 stent"  . Tonsillectomy and adenoidectomy  1950's  . Fracture surgery    . Prostate biopsy  <05/2011  . Cardiac catheterization N/A 11/24/2014    Procedure: Left Heart Cath and Coronary Angiography;  Surgeon: Adrian Prows, MD;  Location: [facility];  Service: Cardiovascular;  Laterality: N/A;  . Cardiac catheterization N/A 11/24/2014    Procedure: Coronary Stent Intervention;  Surgeon: Adrian Prows, MD;  Location: [facility];  Service: Cardiovascular;  Laterality: N/A;   Family History  Problem Relation Age of Onset  . Benign prostatic hyperplasia Father   . Cancer Father     throat  . Heart disease Brother   . Colon cancer Neg Hx    Social History  Substance Use Topics  . Smoking status: Never Smoker   . Smokeless tobacco: Never Used  . Alcohol Use: 3.0 oz/week    5 Glasses of wine per week    Review of Systems  Musculoskeletal: Negative for arthralgias.  Skin: Positive for wound (dog bite, right hand).  Neurological: Negative for numbness.       Negative for sensation loss.   Allergies  Review of patient's allergies indicates no known allergies.  Home Medications   Prior to Admission medications   Medication Sig Start Date End Date Taking?  Authorizing Provider  aspirin EC 81 MG EC tablet Take 1 tablet (81 mg total) by mouth daily. 01/14/13   Modena Jansky, MD  atorvastatin (LIPITOR) 80 MG tablet Take 1 tablet (80 mg total) by mouth daily at 6 PM. 11/25/14   Neldon Labella, NP  calcium carbonate (OS-CAL) 600 MG TABS Take 600 mg by mouth daily.     Historical Provider, MD  Camphor-Menthol-Methyl Sal (TIGER BALM MUSCLE RUB EX) Apply 1 application topically daily as needed. For back pain    Historical Provider, MD  carvedilol (COREG) 3.125 MG tablet Take 1 tablet (3.125 mg total) by mouth 2 (two) times daily with a meal. 11/25/14   Neldon Labella, NP  fish oil-omega-3 fatty acids 1000 MG capsule Take 1 g by mouth  daily.     Historical Provider, MD  Multiple Vitamin (MULTIVITAMIN) tablet Take 1 tablet by mouth daily.     Historical Provider, MD  nitroGLYCERIN (NITROSTAT) 0.4 MG SL tablet Place 1 tablet (0.4 mg total) under the tongue every 5 (five) minutes x 3 doses as needed for chest pain. 11/25/14   Neldon Labella, NP  omeprazole (PRILOSEC) 40 MG capsule Take 40 mg by mouth daily.    Historical Provider, MD  ticagrelor (BRILINTA) 90 MG TABS tablet Take 1 tablet (90 mg total) by mouth 2 (two) times daily. 11/25/14   Bridgette Ebony Hail, NP   BP 157/86 mmHg  Pulse 59  Temp(Src) 98.1 F (36.7 C) (Oral)  Resp 18  Ht 5\' 10"  (1.778 m)  Wt 166 lb (75.297 kg)  BMI 23.82 kg/m2  SpO2 97% Physical Exam  Constitutional: He is oriented to person, place, and time. He appears well-developed and well-nourished. No distress.  HENT:  Head: Normocephalic and atraumatic.  Eyes: Conjunctivae and EOM are normal.  Neck: Neck supple. No tracheal deviation present.  Cardiovascular: Normal rate and intact distal pulses.   Brisk cap refill  Pulmonary/Chest: Effort normal. No respiratory distress.  Musculoskeletal: Normal range of motion.  ROM and strength of right thumb 5/5, no evidence of foreign body or tendon or ligament injury, no bony abnormality or deformity  Neurological: He is alert and oriented to person, place, and time.  Sensation and strength intact  Skin: Skin is warm and dry.  Several small wounds to right thumb, no deep lacerations, no foreign body, bleeding is controlled  Psychiatric: He has a normal mood and affect. His behavior is normal.  Nursing note and vitals reviewed.  ED Course  Procedures (including critical care time)  DIAGNOSTIC STUDIES: Oxygen Saturation is 97% on RA, normal by my interpretation.    COORDINATION OF CARE: 10:17 PM Discussed treatment plan with pt at bedside and pt agreed to plan.  Results for orders placed or performed during the hospital encounter of [id number]   Basic metabolic panel  Result Value Ref Range   Sodium 137 135 - 145 mmol/L   Potassium 4.4 3.5 - 5.1 mmol/L   Chloride 104 101 - 111 mmol/L   CO2 25 22 - 32 mmol/L   Glucose, Bld 130 (H) 65 - 99 mg/dL   BUN 14 6 - 20 mg/dL   Creatinine, Ser 1.25 (H) 0.61 - 1.24 mg/dL   Calcium 9.0 8.9 - 10.3 mg/dL   GFR calc non Af Amer 57 (L) >60 mL/min   GFR calc Af Amer >60 >60 mL/min   Anion gap 8 5 - 15  CBC  Result Value Ref Range   WBC 9.9 4.0 - 10.5 K/uL  RBC 5.00 4.22 - 5.81 MIL/uL   Hemoglobin 14.7 13.0 - 17.0 g/dL   HCT 45.3 39.0 - 52.0 %   MCV 90.6 78.0 - 100.0 fL   MCH 29.4 26.0 - 34.0 pg   MCHC 32.5 30.0 - 36.0 g/dL   RDW 13.0 11.5 - 15.5 %   Platelets 209 150 - 400 K/uL  APTT  Result Value Ref Range   aPTT 27 24 - 37 seconds  Protime-INR  Result Value Ref Range   Prothrombin Time 14.2 11.6 - 15.2 seconds   INR 1.08 0.00 - 1.49  Troponin I  Result Value Ref Range   Troponin I 0.15 (H) <0.031 ng/mL  CBC  Result Value Ref Range   WBC 7.6 4.0 - 10.5 K/uL   RBC 4.56 4.22 - 5.81 MIL/uL   Hemoglobin 13.7 13.0 - 17.0 g/dL   HCT 41.6 39.0 - 52.0 %   MCV 91.2 78.0 - 100.0 fL   MCH 30.0 26.0 - 34.0 pg   MCHC 32.9 30.0 - 36.0 g/dL   RDW 13.4 11.5 - 15.5 %   Platelets 184 150 - 400 K/uL  Hemoglobin A1c  Result Value Ref Range   Hgb A1c MFr Bld 5.7 (H) 4.8 - 5.6 %   Mean Plasma Glucose 117 mg/dL  Basic metabolic panel  Result Value Ref Range   Sodium 136 135 - 145 mmol/L   Potassium 4.1 3.5 - 5.1 mmol/L   Chloride 105 101 - 111 mmol/L   CO2 24 22 - 32 mmol/L   Glucose, Bld 153 (H) 65 - 99 mg/dL   BUN 12 6 - 20 mg/dL   Creatinine, Ser 1.16 0.61 - 1.24 mg/dL   Calcium 8.4 (L) 8.9 - 10.3 mg/dL   GFR calc non Af Amer >60 >60 mL/min   GFR calc Af Amer >60 >60 mL/min   Anion gap 7 5 - 15  Troponin I  Result Value Ref Range   Troponin I 0.22 (H) <0.031 ng/mL  I-stat troponin, ED  Result Value Ref Range   Troponin i, poc 0.03 0.00 - 0.08 ng/mL   Comment 3           POCT Activated clotting time  Result Value Ref Range   Activated Clotting Time 208 seconds   Dg Finger Thumb Right  08/25/2015  CLINICAL DATA:  Right thumb dog bite.  Small puncture wounds. EXAM: RIGHT THUMB 2+V COMPARISON:  None. FINDINGS: Small the puncture wounds. Small calcification or old bone fragment adjacent to the first IP joint. No acute fracture or dislocation. No soft tissue gas or radiopaque foreign body. IMPRESSION: No acute fracture. Electronically Signed   By: Claudie Revering M.D.   On: 08/25/2015 22:53     I have personally reviewed and evaluated these images and lab results as part of my medical decision-making.    MDM   Final diagnoses:  Dog bite    Patient with animal bite.  Negative x-rays.  Cleansed thoroughly in ED.  DC to home with augmentin.  Tdap updated.  F/U with hand or PCP or return for worsening symptoms.  Patient seen by and discussed with Dr. Gilford Raid, who agrees with the plan.  I personally performed the services described in this documentation, which was scribed in my presence. The recorded information has been reviewed and is accurate.     Montine Circle, PA-C 08/25/15 2303  Isla Pence, MD 08/26/15 [phone]

## 2015-08-25 NOTE — ED Notes (Signed)
Pt has a small laceration to the pt's right thumb.  Bleeding controlled.  Pt soaking thumb in saline and iodine tincture.

## 2015-08-25 NOTE — ED Notes (Signed)
Pt from home states he was breaking up a fight between his two dogs and was bitten in his right hand. There are a few small puncture wounds. The area has been cleaned and bandaged. Bleeding is controlled. Pt does take bloodthinners. Pt states he is unsure which dog bit him, but both are up to date on vaccines. Pt denies pain to the area.

## 2015-08-25 NOTE — ED Notes (Signed)
Patient transported to X-ray 

## 2015-10-01 ENCOUNTER — Ambulatory Visit (INDEPENDENT_AMBULATORY_CARE_PROVIDER_SITE_OTHER): Payer: Medicare Other | Admitting: Urgent Care

## 2015-10-01 ENCOUNTER — Ambulatory Visit (INDEPENDENT_AMBULATORY_CARE_PROVIDER_SITE_OTHER): Payer: Medicare Other

## 2015-10-01 VITALS — BP 128/80 | HR 56 | Temp 97.6°F | Resp 17 | Ht 70.0 in | Wt 167.0 lb

## 2015-10-01 DIAGNOSIS — S6991XD Unspecified injury of right wrist, hand and finger(s), subsequent encounter: Secondary | ICD-10-CM

## 2015-10-01 DIAGNOSIS — M79644 Pain in right finger(s): Secondary | ICD-10-CM | POA: Diagnosis not present

## 2015-10-01 DIAGNOSIS — S61259D Open bite of unspecified finger without damage to nail, subsequent encounter: Secondary | ICD-10-CM

## 2015-10-01 DIAGNOSIS — M7989 Other specified soft tissue disorders: Secondary | ICD-10-CM | POA: Diagnosis not present

## 2015-10-01 DIAGNOSIS — W540XXD Bitten by dog, subsequent encounter: Secondary | ICD-10-CM

## 2015-10-01 DIAGNOSIS — S61051A Open bite of right thumb without damage to nail, initial encounter: Secondary | ICD-10-CM | POA: Diagnosis not present

## 2015-10-01 LAB — POCT CBC
GRANULOCYTE PERCENT: 61.5 % (ref 37–80)
HEMATOCRIT: 41.9 % — AB (ref 43.5–53.7)
HEMOGLOBIN: 14.7 g/dL (ref 14.1–18.1)
LYMPH, POC: 1.8 (ref 0.6–3.4)
MCH: 31.3 pg — AB (ref 27–31.2)
MCHC: 35.1 g/dL (ref 31.8–35.4)
MCV: 89.3 fL (ref 80–97)
MID (cbc): 0.4 (ref 0–0.9)
MPV: 8.7 fL (ref 0–99.8)
POC GRANULOCYTE: 3.6 (ref 2–6.9)
POC LYMPH PERCENT: 30.8 %L (ref 10–50)
POC MID %: 7.7 %M (ref 0–12)
Platelet Count, POC: 168 10*3/uL (ref 142–424)
RBC: 4.69 M/uL (ref 4.69–6.13)
RDW, POC: 15 %
WBC: 5.8 10*3/uL (ref 4.6–10.2)

## 2015-10-01 NOTE — Patient Instructions (Addendum)
Tylenol You may take 500mg  every 6 hours for pain and inflammation of your right thumb.

## 2015-10-01 NOTE — Progress Notes (Signed)
MRN: [id number] DOB: October 06, 1945  Subjective:   Ronald House is a 70 y.o. male presenting for chief complaint of Hand Injury  Reports 1 month history of right thumb injury from a dog bite. He was initially seen at [facility] ED, underwent antibiotic course, had TDAP updated, had normal x-rays. Patient saw improvement thereafter. Today, patient reports recurrent swelling, soreness, transient pain over dorsal aspect of his thumb. Has been playing golf, using powers tools to build shelves the past 3-4 days. Denies fever, trauma, weakness, numbness or tingling.  Ronald House has a current medication list which includes the following prescription(s): aspirin, atorvastatin, carvedilol, fish oil-omega-3 fatty acids, multivitamin, nitroglycerin, ticagrelor, and valsartan. Also has No Known Allergies.  Ronald House  has a past medical history of Peyronie disease; Prostate cancer ([place]); Undiagnosed cardiac murmurs; Bulging lumbar disc; Hematuria; GERD (gastroesophageal reflux disease); Coronary artery disease; Arthritis; DDD (degenerative disc disease), lumbar; Chronic lower back pain; and Anxiety. Also  has past surgical history that includes Open treatment zygomatic arch fracture (~ 1980); Finger fracture surgery (Right, 1980's); Radioactive seed implant (05/22/2011); Coronary angioplasty with stent (11/24/2014); Tonsillectomy and adenoidectomy (1950's); Fracture surgery; Prostate biopsy (<05/2011); Cardiac catheterization (N/A, 11/24/2014); and Cardiac catheterization (N/A, 11/24/2014).  Objective:   Vitals: BP 128/80 mmHg  Pulse 56  Temp(Src) 97.6 F (36.4 C) (Oral)  Resp 17  Ht 5\' 10"  (1.778 m)  Wt 167 lb (75.751 kg)  BMI 23.96 kg/m2  SpO2 97%  Physical Exam  Constitutional: He is oriented to person, place, and time. He appears well-developed and well-nourished.  Cardiovascular: Normal rate.   Pulmonary/Chest: Effort normal.  Musculoskeletal:       Right hand: He exhibits decreased range of motion (flexion of  right thumb), tenderness (right thumb MCP-DIP) and swelling (MCP-distal right thumb). He exhibits normal capillary refill, no deformity and no laceration. Normal sensation noted. Normal strength noted.  Neurological: He is alert and oriented to person, place, and time.   Dg Finger Thumb Right  10/01/2015  CLINICAL DATA:  Dog bite of the thumb 1 month ago with subsequent antibiotic treatment and appear to be clinically improving. However recurrent pain, swelling, and soreness over the dorsum of the thumb has appeared EXAM: RIGHT THUMB 2+V COMPARISON:  Right thumb radiograph of August 25, 2015 FINDINGS: The bones are subjectively mildly osteopenic. The joint spaces are preserved. The cortical margins of the bones appears sharp. No foreign bodies are observed. No significant soft tissue gas collections are demonstrated. IMPRESSION: No acute bony abnormality of the right thumb. No soft tissue gas collections or foreign bodies are observed. Electronically Signed   By: David  Martinique M.D.   On: 10/01/2015 11:22    Results for orders placed or performed in visit on 10/01/15 (from the past 24 hour(s))  POCT CBC     Status: Abnormal   Collection Time: 10/01/15 11:27 AM  Result Value Ref Range   WBC 5.8 4.6 - 10.2 K/uL   Lymph, poc 1.8 0.6 - 3.4   POC LYMPH PERCENT 30.8 10 - 50 %L   MID (cbc) 0.4 0 - 0.9   POC MID % 7.7 0 - 12 %M   POC Granulocyte 3.6 2 - 6.9   Granulocyte percent 61.5 37 - 80 %G   RBC 4.69 4.69 - 6.13 M/uL   Hemoglobin 14.7 14.1 - 18.1 g/dL   HCT, POC 41.9 (A) 43.5 - 53.7 %   MCV 89.3 80 - 97 fL   MCH, POC 31.3 (A) 27 -  31.2 pg   MCHC 35.1 31.8 - 35.4 g/dL   RDW, POC 15.0 %   Platelet Count, POC 168 142 - 424 K/uL   MPV 8.7 0 - 99.8 fL   Assessment and Plan :   1. Swelling of right thumb 2. Thumb pain, right 3. Thumb injury, right, subsequent encounter 4. Dog bite of finger, subsequent encounter - X-ray findings and cbc reassuring. His swelling may be secondary to overuse.  Advised using APAP, referral to [facility] is pending.  Jaynee Eagles, PA-C [facility] [phone] 10/01/2015 11:05 AM

## 2016-07-16 ENCOUNTER — Other Ambulatory Visit: Payer: Self-pay | Admitting: Orthopedic Surgery

## 2016-07-16 DIAGNOSIS — M5136 Other intervertebral disc degeneration, lumbar region: Secondary | ICD-10-CM

## 2016-08-22 ENCOUNTER — Other Ambulatory Visit: Payer: BC Managed Care – PPO

## 2016-09-24 ENCOUNTER — Other Ambulatory Visit: Payer: Self-pay | Admitting: Neurosurgery

## 2016-09-24 DIAGNOSIS — M5136 Other intervertebral disc degeneration, lumbar region: Secondary | ICD-10-CM

## 2016-10-03 ENCOUNTER — Other Ambulatory Visit: Payer: BC Managed Care – PPO

## 2018-05-24 ENCOUNTER — Other Ambulatory Visit: Payer: Self-pay

## 2018-05-24 MED ORDER — OLMESARTAN MEDOXOMIL 40 MG PO TABS: 40.0000 mg | ORAL_TABLET | Freq: Every day | ORAL | 1 refills | Status: DC

## 2018-05-24 MED ORDER — CARVEDILOL 3.125 MG PO TABS: 3.1250 mg | ORAL_TABLET | Freq: Two times a day (BID) | ORAL | 1 refills | Status: DC

## 2018-05-24 MED ORDER — ATORVASTATIN CALCIUM 80 MG PO TABS: 80.0000 mg | ORAL_TABLET | Freq: Every day | ORAL | 1 refills | Status: DC

## 2018-06-01 NOTE — Progress Notes (Signed)
Subjective:   Ronald House, male    DOB: 05/05/1945, 73 y.o.   MRN: [id number]  Jani Gravel, MD:  Chief Complaint  Patient presents with   Coronary Artery Disease   Follow-up    HPI: Ronald House  is a 73 y.o. male  with history of prostate cancer, CAD with stenting to large mid Circumflex coronary artery with 4.0x22 mm Resolute DES in 2016. He has residual 50% mid RCA and 10% prox to mid LAD stenosis also noted with normal LVEF.  Patient is here for 6 month office visit for CAD. Presently doing well without any complaints. He has been walking 2 miles daily that he tolerates well. He has not been monitoring his blood pressure at home. Tolerating medications well.    Past Medical History:  Diagnosis Date   Anxiety    Arteriosclerosis of both carotid arteries 06/02/2018   Arthritis    "lower back" (11/24/2014)   Bulging lumbar disc    Chronic lower back pain    Coronary artery disease    DDD (degenerative disc disease), lumbar    Dyspnea on exertion 06/02/2018   GERD (gastroesophageal reflux disease)    Hematuria    Peyronie disease    Prostate cancer ([place])    Undiagnosed cardiac murmurs    childhood/unknown if present issue    Past Surgical History:  Procedure Laterality Date   CARDIAC CATHETERIZATION N/A 11/24/2014   Procedure: Left Heart Cath and Coronary Angiography;  Surgeon: Adrian Prows, MD;  Location: [facility];  Service: Cardiovascular;  Laterality: N/A;   CARDIAC CATHETERIZATION N/A 11/24/2014   Procedure: Coronary Stent Intervention;  Surgeon: Adrian Prows, MD;  Location: [place] [facility];  Service: Cardiovascular;  Laterality: N/A;   CORONARY ANGIOPLASTY WITH STENT PLACEMENT  11/24/2014   "1 stent"   FINGER FRACTURE SURGERY Right 1980's   index finger   FRACTURE SURGERY     OPEN TREATMENT ZYGOMATIC ARCH FRACTURE  ~ [place] BIOPSY  <05/2011   RADIOACTIVE SEED IMPLANT  05/22/2011   Procedure: RADIOACTIVE SEED IMPLANT;  Surgeon:  Ailene Rud, MD;  Location[facility];  Service: Urology;  Laterality: N/A;  RAD TECH OK PER [facility]    TONSILLECTOMY AND ADENOIDECTOMY  65's    Family History  Problem Relation Age of Onset   Benign prostatic hyperplasia Father    Cancer Father        throat   Heart disease Brother    Colon cancer Neg Hx     Social History   Socioeconomic History   Marital status: Married    Spouse name: Not on file   Number of children: 2   Years of education: Not on file   Highest education level: Not on file  Occupational History   Not on file  Social Needs   Financial resource strain: Not on file   Food insecurity:    Worry: Not on file    Inability: Not on file   Transportation needs:    Medical: Not on file    Non-medical: Not on file  Tobacco Use   Smoking status: Never Smoker   Smokeless tobacco: Never Used  Substance and Sexual Activity   Alcohol use: Yes    Alcohol/week: 5.0 standard drinks    Types: 5 Glasses of wine per week   Drug use: No   Sexual activity: Yes  Lifestyle   Physical activity:  Days per week: Not on file    Minutes per session: Not on file   Stress: Not on file  Relationships   Social connections:    Talks on phone: Not on file    Gets together: Not on file    Attends religious service: Not on file    Active member of club or organization: Not on file    Attends meetings of clubs or organizations: Not on file    Relationship status: Not on file   Intimate partner violence:    Fear of current or ex partner: Not on file    Emotionally abused: Not on file    Physically abused: Not on file    Forced sexual activity: Not on file  Other Topics Concern   Not on file  Social History Narrative   Not on file    Current Meds  Medication Sig   aspirin EC 81 MG EC tablet Take 1 tablet (81 mg total) by mouth daily.   atorvastatin (LIPITOR) 80 MG tablet Take 1 tablet (80 mg total) by mouth  daily at 6 PM.   carvedilol (COREG) 3.125 MG tablet Take 1 tablet (3.125 mg total) by mouth 2 (two) times daily with a meal.   Multiple Vitamin (MULTIVITAMIN) tablet Take 1 tablet by mouth daily.    nitroGLYCERIN (NITROSTAT) 0.4 MG SL tablet Place 1 tablet (0.4 mg total) under the tongue every 5 (five) minutes x 3 doses as needed for chest pain.   olmesartan (BENICAR) 40 MG tablet Take 1 tablet (40 mg total) by mouth daily.     Review of Systems  Constitution: Negative for decreased appetite, malaise/fatigue, weight gain and weight loss.  Eyes: Negative for visual disturbance.  Cardiovascular: Negative for chest pain, claudication, dyspnea on exertion, leg swelling, orthopnea, palpitations and syncope.  Respiratory: Negative for hemoptysis and wheezing.   Endocrine: Negative for cold intolerance and heat intolerance.  Hematologic/Lymphatic: Does not bruise/bleed easily.  Skin: Negative for nail changes.  Musculoskeletal: Negative for muscle weakness and myalgias.  Gastrointestinal: Negative for abdominal pain, change in bowel habit, nausea and vomiting.  Neurological: Negative for difficulty with concentration, dizziness, focal weakness and headaches.  Psychiatric/Behavioral: Negative for altered mental status and suicidal ideas.  All other systems reviewed and are negative.      Objective:     Blood pressure (!) 153/106, pulse 70, height _0  (1.676 m), weight 175 lb 3.2 oz (79.5 kg), SpO2 98 %.  Cardiac studies:  EKG 06/02/2018: Normal sinus rhythm at 68 bpm, normal axis, no evidence of ischemia.   Echocardiogram 12/01/2017: Left ventricle cavity is normal in size. Normal global wall motion. Doppler evidence of grade I (impaired) diastolic dysfunction, normal LAP. Calculated EF 63%. Trileaflet aortic valve with mild (Grade I) regurgitation. Structurally normal mitral valve with trace regurgitation. Structurally normal tricuspid valve with mild regurgitation. No evidence of  pulmonary hypertension. Compared to 02/15/2015, no significant change.  Exercise sestamibi stress test 11/27/2017: 1. The patient performed treadmill exercise using Bruce protocol, completing 10:39 minutes. The patient completed an estimated workload of 11.6 METS, reaching 83% of the maximum predicted heart rate. Normal hemodynamic response. No stress symptoms reported. Excellent exercise capacity. Patient only reached 83% of age predicted maximum heart rate in spite of achieving 11.6 METS exercise. No ischemic changes seen on stress electrocardiogram on near maximal exercise. 2. The overall quality of the study is excellent. There is no evidence of abnormal lung activity. Stress and rest SPECT images demonstrate homogeneous tracer  distribution throughout the myocardium. Gated SPECT imaging reveals normal myocardial thickening and wall motion. The left ventricular ejection fraction was normal (61%). 3. Low risk, near maximal exercise nuclear stress study. Consider CTA coronary, if suspicion for obstructive CAD is high.   Carotid artery duplex 11/30/2014: No hemodynamically significant arterial disease in the internal carotid artery bilaterally. Mild soft plaque noted.  Coronary angiogram 11/25/2014: S/P 4.0x22 mm Resulute DES to Mid Cx. Mid RCA 40%. Mild disease LAD. LVEF 60%.  Carotid duplex 01/14/2013: The vertebral arteries appear patent with antegrade flow. Findings consistent with 1- 39% stenosis involving the right internal carotid artery and the left internal carotid artery.   Recent Labs:   08/06/2017: Creatinine 1.19, EGFR 61/71, potassium 5.6, sodium 145, CMP otherwise normal. Cholesterol 123, triglycerides 34, HDL 67, LDL 49.   01/19/2017: TSH normal, PSA normal. Vitamin D 41.   Lab Results  Component Value Date   HGBA1C 5.7 (H) 11/25/2014     Physical Exam  Constitutional: He appears well-developed and well-nourished. No distress.  HENT:  Head: Atraumatic.  Eyes: Conjunctivae are  normal.  Neck: Neck supple. No JVD present. No thyromegaly present.  Cardiovascular: Normal rate, regular rhythm and intact distal pulses. Exam reveals no gallop.  Murmur heard.  Early systolic murmur is present with a grade of 1/6 at the upper right sternal border. Pulmonary/Chest: Effort normal and breath sounds normal.  Abdominal: Soft. Bowel sounds are normal. A hernia is present. Hernia confirmed positive in the ventral area (Reducible).  Musculoskeletal: Normal range of motion.        General: No edema.  Neurological: He is alert.  Skin: Skin is warm and dry.  Psychiatric: He has a normal mood and affect.  Vitals reviewed.        Assessment & Recommendations:  Coronary artery disease involving native coronary artery of native heart without angina pectoris - Plan: EKG 12-Lead, Obtain medical records  Essential hypertension  Mixed hyperlipidemia  Aortic systolic murmur on examination  Laboratory examination   Recommendations:  Patient is here for 69-monthoffice visit and follow-up for CAD.  He is presently doing well without any symptoms of angina.  Walking 2 miles per day and tolerating this well.  Blood pressure is elevated today that he attributes to having a stressful morning and being under a lot of stress and concern over his daughter who is 3 months pregnant.  Advised him to monitor at home and to contact me if blood pressure continues to be elevated.  It did improve while sitting in our office.  He reports recent labs with PCP office have been stable, will request prior records.  No changes are noted to physical exam or EKG.  He is on appropriate medical therapy.  He did have soft systolic ejection murmur on physical exam, suspect flow murmur and not aortic stenosis. Will continue to clinically monitor. I will see him back in 6 months or sooner if problems.  AJeri Lager MSN, APRN, FNP-C [facility], PLindaleOffice: [phone]: [phone]

## 2018-06-02 ENCOUNTER — Encounter: Payer: Self-pay | Admitting: Cardiology

## 2018-06-02 ENCOUNTER — Ambulatory Visit: Payer: Medicare Other | Admitting: Cardiology

## 2018-06-02 ENCOUNTER — Other Ambulatory Visit: Payer: Self-pay

## 2018-06-02 VITALS — BP 143/92 | HR 70 | Ht 66.0 in | Wt 175.2 lb

## 2018-06-02 DIAGNOSIS — R0600 Dyspnea, unspecified: Secondary | ICD-10-CM

## 2018-06-02 DIAGNOSIS — I2510 Atherosclerotic heart disease of native coronary artery without angina pectoris: Secondary | ICD-10-CM | POA: Diagnosis not present

## 2018-06-02 DIAGNOSIS — R0609 Other forms of dyspnea: Secondary | ICD-10-CM

## 2018-06-02 DIAGNOSIS — I358 Other nonrheumatic aortic valve disorders: Secondary | ICD-10-CM

## 2018-06-02 DIAGNOSIS — I10 Essential (primary) hypertension: Secondary | ICD-10-CM | POA: Diagnosis not present

## 2018-06-02 DIAGNOSIS — I6523 Occlusion and stenosis of bilateral carotid arteries: Secondary | ICD-10-CM | POA: Insufficient documentation

## 2018-06-02 DIAGNOSIS — E782 Mixed hyperlipidemia: Secondary | ICD-10-CM | POA: Diagnosis not present

## 2018-06-02 DIAGNOSIS — Z0189 Encounter for other specified special examinations: Secondary | ICD-10-CM

## 2018-06-02 HISTORY — DX: Dyspnea, unspecified: R06.00

## 2018-06-02 HISTORY — DX: Occlusion and stenosis of bilateral carotid arteries: I65.23

## 2018-06-02 HISTORY — DX: Other forms of dyspnea: R06.09

## 2018-11-14 ENCOUNTER — Other Ambulatory Visit: Payer: Self-pay | Admitting: Cardiology

## 2018-12-06 ENCOUNTER — Other Ambulatory Visit: Payer: Self-pay

## 2018-12-06 ENCOUNTER — Ambulatory Visit: Payer: Medicare Other | Admitting: Cardiology

## 2018-12-06 ENCOUNTER — Encounter: Payer: Self-pay | Admitting: Cardiology

## 2018-12-06 VITALS — BP 150/80 | HR 52 | Temp 97.3°F | Ht 70.0 in | Wt 172.2 lb

## 2018-12-06 DIAGNOSIS — I2510 Atherosclerotic heart disease of native coronary artery without angina pectoris: Secondary | ICD-10-CM | POA: Diagnosis not present

## 2018-12-06 DIAGNOSIS — I10 Essential (primary) hypertension: Secondary | ICD-10-CM

## 2018-12-06 DIAGNOSIS — E7800 Pure hypercholesterolemia, unspecified: Secondary | ICD-10-CM | POA: Diagnosis not present

## 2018-12-06 NOTE — Progress Notes (Signed)
Primary Physician/Referring:  Jani Gravel, MD  Patient ID: Ronald House, male    DOB: 1946/01/26, 73 y.o.   MRN: [id number]  Chief Complaint  Patient presents with  . Coronary Artery Disease  . Follow-up    6 month   HPI:    Ronald House  is a 73 y.o. Caucasian male with history of prostate cancer, CAD with stenting to large mid Circumflex coronary artery with 4.0x22 mm Resolute DES in 2016. He has residual 50% mid RCA and 10% prox to mid LAD stenosis also noted with normal LVEF.  Patient is here for 6 month office visit for CAD. Presently doing well without any complaints. He has been walking 2 miles daily that he tolerates well. He has not been monitoring his blood pressure at home, States that blood pressure under excellent control. Tolerating medications well.   Past Medical History:  Diagnosis Date  . Anxiety   . Arteriosclerosis of both carotid arteries 06/02/2018  . Arthritis    "lower back" (11/24/2014)  . Bulging lumbar disc   . Chronic lower back pain   . Coronary artery disease   . DDD (degenerative disc disease), lumbar   . Dyspnea on exertion 06/02/2018  . GERD (gastroesophageal reflux disease)   . Hematuria   . Peyronie disease   . Prostate cancer ([place])   . Undiagnosed cardiac murmurs    childhood/unknown if present issue   Past Surgical History:  Procedure Laterality Date  . CARDIAC CATHETERIZATION N/A 11/24/2014   Procedure: Left Heart Cath and Coronary Angiography;  Surgeon: Adrian Prows, MD;  Location: [facility];  Service: Cardiovascular;  Laterality: N/A;  . CARDIAC CATHETERIZATION N/A 11/24/2014   Procedure: Coronary Stent Intervention;  Surgeon: Adrian Prows, MD;  Location: [facility];  Service: Cardiovascular;  Laterality: N/A;  . CORONARY ANGIOPLASTY WITH STENT PLACEMENT  11/24/2014   "1 stent"  . FINGER FRACTURE SURGERY Right 1980's   index finger  . FRACTURE SURGERY    . OPEN TREATMENT ZYGOMATIC ARCH FRACTURE  ~ 1980  . PROSTATE BIOPSY   <05/2011  . RADIOACTIVE SEED IMPLANT  05/22/2011   Procedure: RADIOACTIVE SEED IMPLANT;  Surgeon: Ailene Rud, MD;  Location: [facility];  Service: Urology;  Laterality: N/A;  RAD TECH OK PER [facility]   . TONSILLECTOMY AND ADENOIDECTOMY  1950's   Social History   Socioeconomic History  . Marital status: Married    Spouse name: Not on file  . Number of children: 2  . Years of education: Not on file  . Highest education level: Not on file  Occupational History  . Not on file  Social Needs  . Financial resource strain: Not on file  . Food insecurity    Worry: Not on file    Inability: Not on file  . Transportation needs    Medical: Not on file    Non-medical: Not on file  Tobacco Use  . Smoking status: Never Smoker  . Smokeless tobacco: Never Used  Substance and Sexual Activity  . Alcohol use: Yes    Alcohol/week: 5.0 standard drinks    Types: 5 Glasses of wine per week  . Drug use: No  . Sexual activity: Yes  Lifestyle  . Physical activity    Days per week: Not on file    Minutes per session: Not on file  . Stress: Not on file  Relationships  . Social Herbalist on phone:  Not on file    Gets together: Not on file    Attends religious service: Not on file    Active member of club or organization: Not on file    Attends meetings of clubs or organizations: Not on file    Relationship status: Not on file  . Intimate partner violence    Fear of current or ex partner: Not on file    Emotionally abused: Not on file    Physically abused: Not on file    Forced sexual activity: Not on file  Other Topics Concern  . Not on file  Social History Narrative  . Not on file   ROS  Review of Systems  Constitution: Negative for chills, decreased appetite, malaise/fatigue and weight gain.  Cardiovascular: Negative for dyspnea on exertion, leg swelling and syncope.  Endocrine: Negative for cold intolerance.  Hematologic/Lymphatic: Does not  bruise/bleed easily.  Musculoskeletal: Negative for joint swelling.  Gastrointestinal: Negative for abdominal pain, anorexia, change in bowel habit, hematochezia and melena.  Neurological: Negative for headaches and light-headedness.  Psychiatric/Behavioral: Negative for depression and substance abuse.  All other systems reviewed and are negative.  Objective   Vitals with BMI 12/06/2018 06/02/2018 06/02/2018  Height _0  - _1   Weight 172 lbs 3 oz - 175 lbs 3 oz  BMI 17.61 - 60.73  Systolic 710 626 948  Diastolic 80 92 546  Pulse 52 - 70    Blood pressure (!) 150/80, pulse (!) 52, temperature (!) 97.3 F (36.3 C), height _2  (1.778 m), weight 172 lb 3.2 oz (78.1 kg), SpO2 100 %. Body mass index is 24.71 kg/m.   Physical Exam  Constitutional: He appears well-developed and well-nourished. No distress.  HENT:  Head: Atraumatic.  Eyes: Conjunctivae are normal.  Neck: Neck supple. No JVD present. No thyromegaly present.  Cardiovascular: Normal rate, regular rhythm, normal heart sounds and intact distal pulses. Exam reveals no gallop.  No murmur heard. Pulmonary/Chest: Effort normal and breath sounds normal.  Abdominal: Soft. Bowel sounds are normal.  Musculoskeletal: Normal House of motion.  Neurological: He is alert.  Skin: Skin is warm and dry.  Psychiatric: He has a normal mood and affect.   Radiology: No results found.  Laboratory examination:   08/06/2017: Creatinine 1.19, EGFR 61/71, potassium 5.6, sodium 145, CMP otherwise normal. Cholesterol 123, triglycerides 34, HDL 67, LDL 49.   01/19/2017: TSH normal, PSA normal. Vitamin D 41.  No results for input(s): NA, K, CL, CO2, GLUCOSE, BUN, CREATININE, CALCIUM, GFRNONAA, GFRAA in the last 8760 hours. CMP Latest Ref Rng & Units 11/25/2014 11/24/2014 12/02/2013  Glucose 65 - 99 mg/dL 153(H) 130(H) 125(H)  BUN 6 - 20 mg/dL _3 Creatinine 0.61 - 1.24 mg/dL 1.16 1.25(H) 0.94  Sodium 135 - 145 mmol/L 136 137 137   Potassium 3.5 - 5.1 mmol/L 4.1 4.4 4.1  Chloride 101 - 111 mmol/L 105 104 101  CO2 22 - 32 mmol/L _4 Calcium 8.9 - 10.3 mg/dL 8.4(L) 9.0 9.0  Total Protein 6.0 - 8.3 g/dL - - 6.3  Total Bilirubin 0.3 - 1.2 mg/dL - - 0.3  Alkaline Phos 39 - 117 U/L - - 87  AST 0 - 37 U/L - - 19  ALT 0 - 53 U/L - - 16   CBC Latest Ref Rng & Units 10/01/2015 11/25/2014 11/24/2014  WBC 4.6 - 10.2 K/uL 5.8 7.6 9.9  Hemoglobin 14.1 - 18.1 g/dL 14.7 13.7 14.7  Hematocrit 43.5 -  53.7 % 41.9(A) 41.6 45.3  Platelets 150 - 400 K/uL - 184 209   Lipid Panel     Component Value Date/Time   CHOL 171 01/14/2013 0435   TRIG 70 01/14/2013 0435   HDL 59 01/14/2013 0435   CHOLHDL 2.9 01/14/2013 0435   VLDL 14 01/14/2013 0435   LDLCALC 98 01/14/2013 0435   HEMOGLOBIN A1C Lab Results  Component Value Date   HGBA1C 5.7 (H) 11/25/2014   MPG 117 11/25/2014   TSH No results for input(s): TSH in the last 8760 hours. Medications  No Known Allergies   Prior to Admission medications   Medication Sig Start Date End Date Taking? Authorizing Provider  aspirin EC 81 MG EC tablet Take 1 tablet (81 mg total) by mouth daily. 01/14/13  Yes Hongalgi, Lenis Dickinson, MD  atorvastatin (LIPITOR) 80 MG tablet TAKE 1 TABLET(80 MG) BY MOUTH DAILY AT 6 PM 11/15/18  Yes Adrian Prows, MD  carvedilol (COREG) 3.125 MG tablet Take 1 tablet (3.125 mg total) by mouth 2 (two) times daily with a meal. 05/24/18  Yes Adrian Prows, MD  fish oil-omega-3 fatty acids 1000 MG capsule Take 1 g by mouth daily.    Yes [provider]  Multiple Vitamin (MULTIVITAMIN) tablet Take 1 tablet by mouth daily.    Yes [provider]  nitroGLYCERIN (NITROSTAT) 0.4 MG SL tablet Place 1 tablet (0.4 mg total) under the tongue every 5 (five) minutes x 3 doses as needed for chest pain. 11/25/14  Yes Neldon Labella, NP  olmesartan (BENICAR) 40 MG tablet Take 1 tablet (40 mg total) by mouth daily. 05/24/18  Yes Adrian Prows, MD     Current Outpatient  Medications  Medication Instructions  . aspirin 81 mg, Oral, Daily  . atorvastatin (LIPITOR) 80 MG tablet TAKE 1 TABLET(80 MG) BY MOUTH DAILY AT 6 PM  . carvedilol (COREG) 3.125 mg, Oral, 2 times daily with meals  . fish oil-omega-3 fatty acids 1 g, Oral, Daily  . Multiple Vitamin (MULTIVITAMIN) tablet 1 tablet, Daily  . nitroGLYCERIN (NITROSTAT) 0.4 mg, Sublingual, Every 5 min x3 PRN  . olmesartan (BENICAR) 40 mg, Oral, Daily    Cardiac Studies:   Carotid artery duplex 12-01-14: No hemodynamically significant arterial disease in the internal carotid artery bilaterally. Mild soft plaque noted.  Coronary angiogram 11/25/2014: S/P 4.0x22 mm Resulute DES to Mid Cx. Mid RCA 40%. Mild disease LAD. LVEF 60%.  Echocardiogram 12/01/2017: Left ventricle cavity is normal in size. Normal global wall motion. Doppler evidence of grade I (impaired) diastolic dysfunction, normal LAP. Calculated EF 63%. Trileaflet aortic valve with mild (Grade I) regurgitation. Structurally normal mitral valve with trace regurgitation. Structurally normal tricuspid valve with mild regurgitation. No evidence of pulmonary hypertension. Compared to 02/15/2015, no significant change.  Exercise sestamibi stress test 11/27/2017: 1. The patient performed treadmill exercise using Bruce protocol, completing 10:39 minutes. The patient completed an estimated workload of 11.6 METS, reaching 83% of the maximum predicted heart rate. Normal hemodynamic response. No stress symptoms reported. Excellent exercise capacity. Patient only reached 83% of age predicted maximum heart rate in spite of achieving 11.6 METS exercise. No ischemic changes seen on stress electrocardiogram on near maximal exercise. 2. The overall quality of the study is excellent. There is no evidence of abnormal lung activity. Stress and rest SPECT images demonstrate homogeneous tracer distribution throughout the myocardium. Gated SPECT imaging reveals normal myocardial  thickening and wall motion. The left ventricular ejection fraction was normal (61%). 3. Low risk, near  maximal exercise nuclear stress study. Consider CTA coronary, if suspicion for obstructive CAD is high.   Assessment     ICD-10-CM   1. Coronary artery disease involving native coronary artery of native heart without angina pectoris  I25.10 EKG 12-Lead  2. Essential hypertension  I10   3. Pure hypercholesterolemia  E78.00     EKG 12/06/2018: Marked sinus bradycardia at rate of 49 bpm, normal axis, voltage criteria for LVH.  No evidence of ischemia.  Otherwise normal EKG.  No significant change from  EKG 06/02/2018: Normal sinus rhythm at 68 bpm.   Recommendations:   Patient is here on annual visit and follow-up of coronary artery disease, he remains asymptomatic without recurrence of angina pectoris, physical examination is unchanged.  EKG does reveal marked sinus bradycardia but he is essentially asymptomatic with regard to this.  Blood pressure is uncontrolled, previously 6 months ago also his blood pressure was elevated, but he has excellent blood pressure control at home.  Hence I did not make any changes to his medications however he has a complete physical and lab evaluation to be done by Dr. Maudie Mercury next month and if the blood pressure is still elevated at that time, would strongly consider addition of amlodipine 5 mg daily or even consider adding a diuretic.  Lipids well-controlled about a year ago, this is being managed by his PCP as well.  I'll see him back in a year or sooner if problems.  Adrian Prows, MD, [facility] 12/06/2018, 9:32 AM [facility] [place] Pager: [phone] Office: [phone] If no answer Cell [phone]

## 2018-12-25 ENCOUNTER — Emergency Department (HOSPITAL_COMMUNITY): Payer: Medicare Other

## 2018-12-25 ENCOUNTER — Other Ambulatory Visit: Payer: Self-pay

## 2018-12-25 ENCOUNTER — Emergency Department (HOSPITAL_COMMUNITY)
Admission: EM | Admit: 2018-12-25 | Discharge: 2018-12-25 | Disposition: A | Discharge status: Home / Self Care | Payer: Medicare Other | Attending: Emergency Medicine | Admitting: Emergency Medicine

## 2018-12-25 ENCOUNTER — Other Ambulatory Visit: Payer: Self-pay | Admitting: Cardiology

## 2018-12-25 ENCOUNTER — Encounter (HOSPITAL_COMMUNITY): Payer: Self-pay

## 2018-12-25 DIAGNOSIS — Z79899 Other long term (current) drug therapy: Secondary | ICD-10-CM | POA: Diagnosis not present

## 2018-12-25 DIAGNOSIS — I2510 Atherosclerotic heart disease of native coronary artery without angina pectoris: Secondary | ICD-10-CM | POA: Insufficient documentation

## 2018-12-25 DIAGNOSIS — I10 Essential (primary) hypertension: Secondary | ICD-10-CM | POA: Diagnosis not present

## 2018-12-25 DIAGNOSIS — R42 Dizziness and giddiness: Secondary | ICD-10-CM | POA: Diagnosis not present

## 2018-12-25 LAB — CBC WITH DIFFERENTIAL/PLATELET
Abs Immature Granulocytes: 0.03 10*3/uL (ref 0.00–0.07)
Basophils Absolute: 0 10*3/uL (ref 0.0–0.1)
Basophils Relative: 0 %
Eosinophils Absolute: 0.1 10*3/uL (ref 0.0–0.5)
Eosinophils Relative: 2 %
HCT: 42.8 % (ref 39.0–52.0)
Hemoglobin: 13.7 g/dL (ref 13.0–17.0)
Immature Granulocytes: 1 %
Lymphocytes Relative: 32 %
Lymphs Abs: 1.9 10*3/uL (ref 0.7–4.0)
MCH: 30.8 pg (ref 26.0–34.0)
MCHC: 32 g/dL (ref 30.0–36.0)
MCV: 96.2 fL (ref 80.0–100.0)
Monocytes Absolute: 0.3 10*3/uL (ref 0.1–1.0)
Monocytes Relative: 6 %
Neutro Abs: 3.5 10*3/uL (ref 1.7–7.7)
Neutrophils Relative %: 59 %
Platelets: 197 10*3/uL (ref 150–400)
RBC: 4.45 MIL/uL (ref 4.22–5.81)
RDW: 12.9 % (ref 11.5–15.5)
WBC: 5.9 10*3/uL (ref 4.0–10.5)
nRBC: 0 % (ref 0.0–0.2)

## 2018-12-25 LAB — BASIC METABOLIC PANEL
Anion gap: 6 (ref 5–15)
BUN: 19 mg/dL (ref 8–23)
CO2: 26 mmol/L (ref 22–32)
Calcium: 9 mg/dL (ref 8.9–10.3)
Chloride: 108 mmol/L (ref 98–111)
Creatinine, Ser: 1.07 mg/dL (ref 0.61–1.24)
GFR calc Af Amer: >60 mL/min (ref >60)
GFR calc non Af Amer: >60 mL/min (ref >60)
Glucose, Bld: 109 mg/dL — ABNORMAL HIGH (ref 70–99)
Potassium: 4.3 mmol/L (ref 3.5–5.1)
Sodium: 140 mmol/L (ref 135–145)

## 2018-12-25 MED ORDER — PROCHLORPERAZINE EDISYLATE 10 MG/2ML IJ SOLN
5.0000 mg | Freq: Once | INTRAMUSCULAR | Status: AC
  Administered 2018-12-25: 10:00:00 5 mg via INTRAVENOUS
  Filled 2018-12-25: qty 2

## 2018-12-25 MED ORDER — DIPHENHYDRAMINE HCL 50 MG/ML IJ SOLN
12.5000 mg | Freq: Once | INTRAMUSCULAR | Status: AC
  Administered 2018-12-25: 10:00:00 12.5 mg via INTRAVENOUS
  Filled 2018-12-25: qty 1

## 2018-12-25 MED ORDER — LORAZEPAM 2 MG/ML IJ SOLN
0.5000 mg | Freq: Once | INTRAMUSCULAR | Status: AC
  Administered 2018-12-25: 0.5 mg via INTRAVENOUS
  Filled 2018-12-25: qty 1

## 2018-12-25 MED ORDER — MECLIZINE HCL 25 MG PO TABS: 25.0000 mg | ORAL_TABLET | Freq: Three times a day (TID) | ORAL | 0 refills | Status: DC | PRN

## 2018-12-25 NOTE — ED Notes (Signed)
Patient Alert and oriented to baseline. Stable and ambulatory to baseline. Patient verbalized understanding of the discharge instructions.  Patient belongings were taken by the patient.   

## 2018-12-25 NOTE — ED Provider Notes (Signed)
[name] EMERGENCY DEPARTMENT Provider Note   CSN: [id number] Arrival date & time: 12/25/18  0847     History   Chief Complaint Chief Complaint  Patient presents with   Dizziness    HPI Ronald House is a 73 y.o. male.     73 yo M with a chief complaints of sudden onset dizziness.  This occurred when he rolled over quickly in bed.  Occurred about 6:30 in the morning.  Patient felt acutely very dizzy every time he would turn his head or stand up it would recur.  Improved with closing his eyes or keeping his head still.  Has never had an issue like this before.  Felt a little funny on the right side of his face and felt like he was very thirsty.  Denied any chest pain or shortness of breath denied fevers or chills.  No recent cough or congestion.  The patient did have a couple episodes of vomiting with the dizziness.  His symptoms have gotten significantly better.  He can move his head now freely without issue.  The history is provided by the patient and the spouse.  Dizziness Quality:  Head spinning Severity:  Moderate Onset quality:  Gradual Duration:  2 days Timing:  Constant Progression:  Worsening Chronicity:  New Relieved by:  Nothing Worsened by:  Nothing Ineffective treatments:  None tried Associated symptoms: nausea and vomiting   Associated symptoms: no chest pain, no diarrhea, no headaches, no palpitations and no shortness of breath     Past Medical History:  Diagnosis Date   Anxiety    Arteriosclerosis of both carotid arteries 06/02/2018   Arthritis    "lower back" (11/24/2014)   Bulging lumbar disc    Chronic lower back pain    Coronary artery disease    DDD (degenerative disc disease), lumbar    Dyspnea on exertion 06/02/2018   GERD (gastroesophageal reflux disease)    Hematuria    Peyronie disease    Prostate cancer ([place])    Undiagnosed cardiac murmurs    childhood/unknown if present issue    Patient Active Problem  List   Diagnosis Date Noted   Arteriosclerosis of both carotid arteries 06/02/2018   Dyspnea on exertion 06/02/2018   Laboratory examination 06/02/2018   Coronary artery disease involving native coronary artery of native heart without angina pectoris 06/02/2018   Essential hypertension 06/02/2018   Mixed hyperlipidemia [id number]   Aortic systolic murmur on examination 06/02/2018   Unstable angina pectoris ([place]) 11/24/2014   NSTEMI (non-ST elevated myocardial infarction) ([place]) 11/24/2014   TIA (transient ischemic attack) 01/13/2013   Prostate cancer ([place]) 03/26/2011    Past Surgical History:  Procedure Laterality Date   CARDIAC CATHETERIZATION N/A 11/24/2014   Procedure: Left Heart Cath and Coronary Angiography;  Surgeon: Adrian Prows, MD;  Location: [facility];  Service: Cardiovascular;  Laterality: N/A;   CARDIAC CATHETERIZATION N/A 11/24/2014   Procedure: Coronary Stent Intervention;  Surgeon: Adrian Prows, MD;  Location: [facility];  Service: Cardiovascular;  Laterality: N/A;   CORONARY ANGIOPLASTY WITH STENT PLACEMENT  11/24/2014   "1 stent"   FINGER FRACTURE SURGERY Right 1980's   index finger   FRACTURE SURGERY     OPEN TREATMENT ZYGOMATIC ARCH FRACTURE  ~ [place] BIOPSY  <05/2011   RADIOACTIVE SEED IMPLANT  05/22/2011   Procedure: RADIOACTIVE SEED IMPLANT;  Surgeon: Ailene Rud, MD;  Location: [facility];  Service: Urology;  Laterality: N/A;  RAD TECH OK PER [facility]    TONSILLECTOMY AND ADENOIDECTOMY  1950's        Home Medications    Prior to Admission medications   Medication Sig Start Date End Date Taking? Authorizing Provider  aspirin EC 81 MG EC tablet Take 1 tablet (81 mg total) by mouth daily. 01/14/13   Hongalgi, Lenis Dickinson, MD  atorvastatin (LIPITOR) 80 MG tablet TAKE 1 TABLET(80 MG) BY MOUTH DAILY AT 6 PM 11/15/18   Adrian Prows, MD  carvedilol (COREG) 3.125 MG tablet Take 1 tablet (3.125 mg total) by  mouth 2 (two) times daily with a meal. 05/24/18   Adrian Prows, MD  fish oil-omega-3 fatty acids 1000 MG capsule Take 1 g by mouth daily.     [provider]  meclizine (ANTIVERT) 25 MG tablet Take 1 tablet (25 mg total) by mouth 3 (three) times daily as needed for dizziness. 12/25/18   Deno Etienne, DO  Multiple Vitamin (MULTIVITAMIN) tablet Take 1 tablet by mouth daily.     [provider]  nitroGLYCERIN (NITROSTAT) 0.4 MG SL tablet Place 1 tablet (0.4 mg total) under the tongue every 5 (five) minutes x 3 doses as needed for chest pain. 11/25/14   Neldon Labella, NP  olmesartan (BENICAR) 40 MG tablet Take 1 tablet (40 mg total) by mouth daily. 05/24/18   Adrian Prows, MD    Family History Family History  Problem Relation Age of Onset   Benign prostatic hyperplasia Father    Cancer Father        throat   Heart disease Brother    Colon cancer Neg Hx     Social History Social History   Tobacco Use   Smoking status: Never Smoker   Smokeless tobacco: Never Used  Substance Use Topics   Alcohol use: Yes    Alcohol/week: 5.0 standard drinks    Types: 5 Glasses of wine per week   Drug use: No     Allergies   Patient has no known allergies.   Review of Systems Review of Systems  Constitutional: Negative for chills and fever.  HENT: Negative for congestion and facial swelling.   Eyes: Negative for discharge and visual disturbance.  Respiratory: Negative for shortness of breath.   Cardiovascular: Negative for chest pain and palpitations.  Gastrointestinal: Positive for nausea and vomiting. Negative for abdominal pain and diarrhea.  Musculoskeletal: Negative for arthralgias and myalgias.  Skin: Negative for color change and rash.  Neurological: Positive for dizziness. Negative for tremors, syncope and headaches.  Psychiatric/Behavioral: Negative for confusion and dysphoric mood.     Physical Exam Updated Vital Signs BP 129/64 (BP Location: Right Arm)     Pulse (!) 59    Temp 98.1 F (36.7 C) (Oral)    Resp 14    Ht 5' 3.75" (1.619 m)    Wt 78 kg    SpO2 97%    BMI 29.76 kg/m   Physical Exam Vitals signs and nursing note reviewed.  Constitutional:      Appearance: He is well-developed.  HENT:     Head: Normocephalic and atraumatic.  Eyes:     Pupils: Pupils are equal, round, and reactive to light.  Neck:     Musculoskeletal: Normal range of motion and neck supple.     Vascular: No JVD.  Cardiovascular:     Rate and Rhythm: Normal rate and regular rhythm.     Heart sounds: No murmur. No friction rub. No  gallop.   Pulmonary:     Effort: No respiratory distress.     Breath sounds: No wheezing.  Abdominal:     General: There is no distension.     Tenderness: There is no guarding or rebound.  Musculoskeletal: Normal range of motion.  Skin:    Coloration: Skin is not pale.     Findings: No rash.  Neurological:     Mental Status: He is alert and oriented to person, place, and time.     Cranial Nerves: Cranial nerves are intact.     Sensory: Sensation is intact.     Motor: Motor function is intact.     Coordination: Coordination is intact. Finger-Nose-Finger Test and Heel to [place] Test normal.     Comments: Left-sided fast going nystagmus.  Negative Dix-Hallpike bilaterally.  Otherwise benign neuro exam.  Psychiatric:        Behavior: Behavior normal.      ED Treatments / Results  Labs (all labs ordered are listed, but only abnormal results are displayed) Labs Reviewed  BASIC METABOLIC PANEL - Abnormal; Notable for the following components:      Result Value   Glucose, Bld 109 (*)    All other components within normal limits  CBC WITH DIFFERENTIAL/PLATELET    EKG EKG Interpretation  Date/Time:  Saturday December 25 2018 08:53:51 EDT Ventricular Rate:  53 PR Interval:    QRS Duration: 97 QT Interval:  437 QTC Calculation: 411 R Axis:   75 Text Interpretation:  Sinus rhythm background noise TECHNICALLY DIFFICULT No  significant change since last tracing Confirmed by Deno Etienne [phone]) on 12/25/2018 8:58:54 AM   Radiology Mr Brain Wo Contrast  Result Date: 12/25/2018 CLINICAL DATA:  Persistent vertigo EXAM: MRI HEAD WITHOUT CONTRAST TECHNIQUE: Multiplanar, multiecho pulse sequences of the brain and surrounding structures were obtained without intravenous contrast. COMPARISON:  01/30/2013 FINDINGS: Brain: The brain has a normal appearance without evidence of malformation, accelerated atrophy, old or acute small or large vessel infarction, mass lesion, hemorrhage, hydrocephalus or extra-axial collection. There is only mild age related volume loss and a few punctate foci of T2 and FLAIR signal within the frontal white matter, both expected at this age. CP angle regions appear normal on this standard noncontrast exam. Vascular: Major vessels at the base of the brain show flow. Venous sinuses appear patent. Skull and upper cervical spine: Normal. Sinuses/Orbits: Clear/normal. Other: None significant. IMPRESSION: Normal examination for age. No abnormality seen to explain the presenting symptoms. Electronically Signed   By: Nelson Chimes M.D.   On: 12/25/2018 12:34    Procedures Procedures (including critical care time)  Medications Ordered in ED Medications  prochlorperazine (COMPAZINE) injection 5 mg (5 mg Intravenous Given 12/25/18 0951)  diphenhydrAMINE (BENADRYL) injection 12.5 mg (12.5 mg Intravenous Given 12/25/18 0953)  LORazepam (ATIVAN) injection 0.5 mg (0.5 mg Intravenous Given 12/25/18 0951)     Initial Impression / Assessment and Plan / ED Course  I have reviewed the triage vital signs and the nursing notes.  Pertinent labs & imaging results that were available during my care of the patient were reviewed by me and considered in my medical decision making (see chart for details).        73 yo M with a chief complaint of sudden onset vertigo.  Most likely this is BPPV based on history.  Textbook  rolling over in bed is the onset of the symptoms.  Patient however is unable to have reproduction with the Dix-Hallpike maneuver.  Patient has risk factors for stroke including age.  I discussed risks and benefits of MRI imaging.  Currently electing for MRI.  We will give headache cocktail.  Bolus of IV fluids.  Check basic labs.  Basic lab work is unremarkable.  No significant electrolyte abnormality.  Not anemic.  No leukocytosis.  MRI of the brain is negative for acute stroke.  Patient is continued to feel well.  Able to ambulate without issue.  Started on meclizine.  PCP follow-up.  12:45 PM:  I have discussed the diagnosis/risks/treatment options with the patient and family and believe the pt to be eligible for discharge home to follow-up with PCP. We also discussed returning to the ED immediately if new or worsening sx occur. We discussed the sx which are most concerning (e.g., sudden worsening pain, fever, inability to tolerate by mouth) that necessitate immediate return. Medications administered to the patient during their visit and any new prescriptions provided to the patient are listed below.  Medications given during this visit Medications  prochlorperazine (COMPAZINE) injection 5 mg (5 mg Intravenous Given 12/25/18 0951)  diphenhydrAMINE (BENADRYL) injection 12.5 mg (12.5 mg Intravenous Given 12/25/18 0953)  LORazepam (ATIVAN) injection 0.5 mg (0.5 mg Intravenous Given 12/25/18 0951)     The patient appears reasonably screen and/or stabilized for discharge and I doubt any other medical condition or other [facility] requiring further screening, evaluation, or treatment in the ED at this time prior to discharge.    Final Clinical Impressions(s) / ED Diagnoses   Final diagnoses:  Vertigo    ED Discharge Orders         Ordered    meclizine (ANTIVERT) 25 MG tablet  3 times daily PRN     12/25/18 [place], Imraan Wendell, DO 12/25/18 1245

## 2018-12-25 NOTE — ED Triage Notes (Signed)
Pt had a "dizzy" spell while laying in his bed, states that it happened 3 times while he was laying flat & it was "violent spinning." Pt denies having sat up at any time during these 3 episodes, stated it only took a movement of his head from side to side for it to happen. EMS reported that pt was given 4mg  of zofran d/t n/v while in route to ED. Pt is SB on monitor at arrival  (50 bpm) & has a Hx of stent placement in 2016 (per pt)

## 2018-12-25 NOTE — ED Notes (Signed)
Patient transported to MRI 

## 2018-12-25 NOTE — Discharge Instructions (Signed)
Your MRI was normal.  This means is much less likely that you had a stroke.  Please follow-up with your family doctor in the office.  Please return for worsening symptoms or if you are unable to walk.

## 2018-12-27 ENCOUNTER — Telehealth: Payer: Self-pay

## 2018-12-27 NOTE — Telephone Encounter (Signed)
Pt called and wants to know is it ok If he takes meclizine with his other meds he was prescribed it in the ER

## 2018-12-27 NOTE — Telephone Encounter (Signed)
Yes

## 2018-12-28 NOTE — Telephone Encounter (Signed)
Pt aware.

## 2018-12-31 ENCOUNTER — Other Ambulatory Visit: Payer: Self-pay

## 2018-12-31 MED ORDER — CARVEDILOL 3.125 MG PO TABS: 3.1250 mg | ORAL_TABLET | Freq: Two times a day (BID) | ORAL | 1 refills | Status: DC

## 2019-02-03 ENCOUNTER — Other Ambulatory Visit: Payer: Self-pay

## 2019-02-03 DIAGNOSIS — Z20822 Contact with and (suspected) exposure to covid-19: Secondary | ICD-10-CM

## 2019-02-06 LAB — NOVEL CORONAVIRUS, NAA: SARS-CoV-2, NAA: NOT DETECTED

## 2019-02-24 ENCOUNTER — Other Ambulatory Visit: Payer: Self-pay

## 2019-02-24 DIAGNOSIS — Z20822 Contact with and (suspected) exposure to covid-19: Secondary | ICD-10-CM

## 2019-02-26 LAB — NOVEL CORONAVIRUS, NAA: SARS-CoV-2, NAA: NOT DETECTED

## 2019-06-23 ENCOUNTER — Other Ambulatory Visit: Payer: Self-pay | Admitting: Cardiology

## 2019-08-05 DIAGNOSIS — H35372 Puckering of macula, left eye: Secondary | ICD-10-CM | POA: Diagnosis not present

## 2019-08-05 DIAGNOSIS — H43813 Vitreous degeneration, bilateral: Secondary | ICD-10-CM | POA: Diagnosis not present

## 2019-08-05 DIAGNOSIS — H18511 Endothelial corneal dystrophy, right eye: Secondary | ICD-10-CM | POA: Diagnosis not present

## 2019-08-05 DIAGNOSIS — H25813 Combined forms of age-related cataract, bilateral: Secondary | ICD-10-CM | POA: Diagnosis not present

## 2019-08-05 DIAGNOSIS — H353112 Nonexudative age-related macular degeneration, right eye, intermediate dry stage: Secondary | ICD-10-CM | POA: Diagnosis not present

## 2019-08-18 DIAGNOSIS — C61 Malignant neoplasm of prostate: Secondary | ICD-10-CM | POA: Diagnosis not present

## 2019-08-24 DIAGNOSIS — Z8546 Personal history of malignant neoplasm of prostate: Secondary | ICD-10-CM | POA: Diagnosis not present

## 2019-08-24 DIAGNOSIS — I10 Essential (primary) hypertension: Secondary | ICD-10-CM | POA: Diagnosis not present

## 2019-08-24 DIAGNOSIS — N5201 Erectile dysfunction due to arterial insufficiency: Secondary | ICD-10-CM | POA: Diagnosis not present

## 2019-08-24 DIAGNOSIS — N486 Induration penis plastica: Secondary | ICD-10-CM | POA: Diagnosis not present

## 2019-08-31 DIAGNOSIS — I10 Essential (primary) hypertension: Secondary | ICD-10-CM | POA: Diagnosis not present

## 2019-08-31 DIAGNOSIS — I2510 Atherosclerotic heart disease of native coronary artery without angina pectoris: Secondary | ICD-10-CM | POA: Diagnosis not present

## 2019-08-31 DIAGNOSIS — E785 Hyperlipidemia, unspecified: Secondary | ICD-10-CM | POA: Diagnosis not present

## 2019-09-14 ENCOUNTER — Telehealth: Payer: Self-pay

## 2019-09-14 NOTE — Telephone Encounter (Signed)
Patient called stating that he is having vertigo, and was told he needed to see ENT. He is asking if you recommend anyone? Please advise.

## 2019-09-14 NOTE — Telephone Encounter (Signed)
Dr. Benjamine Mola

## 2019-09-15 NOTE — Telephone Encounter (Signed)
Tried calling patient to let him know Dr. Raylene Miyamoto at [facility]NT. NA, LMAM

## 2019-09-15 NOTE — Telephone Encounter (Signed)
Patient called back and I gave him the information.

## 2019-09-21 ENCOUNTER — Other Ambulatory Visit: Payer: Self-pay | Admitting: Cardiology

## 2019-11-06 ENCOUNTER — Other Ambulatory Visit: Payer: Self-pay | Admitting: Cardiology

## 2019-12-07 ENCOUNTER — Other Ambulatory Visit: Payer: Self-pay

## 2019-12-07 ENCOUNTER — Other Ambulatory Visit: Payer: Self-pay | Admitting: Cardiology

## 2019-12-07 ENCOUNTER — Ambulatory Visit: Payer: Medicare PPO | Admitting: Cardiology

## 2019-12-07 ENCOUNTER — Encounter: Payer: Self-pay | Admitting: Cardiology

## 2019-12-07 VITALS — BP 150/96 | HR 52 | Resp 16 | Ht 63.75 in | Wt 175.0 lb

## 2019-12-07 DIAGNOSIS — I10 Essential (primary) hypertension: Secondary | ICD-10-CM

## 2019-12-07 DIAGNOSIS — I2510 Atherosclerotic heart disease of native coronary artery without angina pectoris: Secondary | ICD-10-CM

## 2019-12-07 DIAGNOSIS — E7800 Pure hypercholesterolemia, unspecified: Secondary | ICD-10-CM | POA: Diagnosis not present

## 2019-12-07 MED ORDER — HYDROCHLOROTHIAZIDE 12.5 MG PO CAPS: 12.5000 mg | ORAL_CAPSULE | ORAL | 0 refills | Status: DC

## 2019-12-07 MED ORDER — OLMESARTAN MEDOXOMIL-HCTZ 40-12.5 MG PO TABS: 1.0000 | ORAL_TABLET | Freq: Every day | ORAL | 3 refills | Status: DC

## 2019-12-07 NOTE — Progress Notes (Addendum)
Primary Physician/Referring:  Jani Gravel, MD  Patient ID: Ronald House, male    DOB: [birth year], 74 y.o.   MRN: [id number]  Chief Complaint  Patient presents with  . Coronary Artery Disease  . Hypertension  . Follow-up    1 year   HPI:    Ronald House  is a 74 y.o. Caucasian male with history of prostate cancer, CAD with stenting to large mid Circumflex coronary artery with 4.0x22 mm Resolute DES in 2016. He has residual 50% mid RCA and 10% prox to mid LAD stenosis also noted with normal LVEF.  The patient presents for 6 month follow up visit for CAD and hypertension. He is doing well, no specific complaints. He is not monitoring his blood pressure at home. He exercises regularly walking 2 miles per day without difficulty. Denies chest pain, dyspnea, lightheadedness, syncope.  Past Medical History:  Diagnosis Date  . Anxiety   . Arteriosclerosis of both carotid arteries 06/02/2018  . Arthritis    "lower back" (11/24/2014)  . Bulging lumbar disc   . Chronic lower back pain   . Coronary artery disease   . DDD (degenerative disc disease), lumbar   . Dyspnea on exertion 06/02/2018  . GERD (gastroesophageal reflux disease)   . Hematuria   . Peyronie disease   . Prostate cancer ([place])   . Undiagnosed cardiac murmurs    childhood/unknown if present issue   Past Surgical History:  Procedure Laterality Date  . CARDIAC CATHETERIZATION N/A 11/24/2014   Procedure: Left Heart Cath and Coronary Angiography;  Surgeon: Adrian Prows, MD;  Location: [facility];  Service: Cardiovascular;  Laterality: N/A;  . CARDIAC CATHETERIZATION N/A 11/24/2014   Procedure: Coronary Stent Intervention;  Surgeon: Adrian Prows, MD;  Location: [facility];  Service: Cardiovascular;  Laterality: N/A;  . CORONARY ANGIOPLASTY WITH STENT PLACEMENT  11/24/2014   "1 stent"  . FINGER FRACTURE SURGERY Right 1980's   index finger  . FRACTURE SURGERY    . OPEN TREATMENT ZYGOMATIC ARCH FRACTURE  ~ 1980  . PROSTATE  BIOPSY  <05/2011  . RADIOACTIVE SEED IMPLANT  05/22/2011   Procedure: RADIOACTIVE SEED IMPLANT;  Surgeon: Ailene Rud, MD;  Location: [facility];  Service: Urology;  Laterality: N/A;  RAD TECH OK PER [facility]   . TONSILLECTOMY AND ADENOIDECTOMY  1950's    Social History   Tobacco Use  . Smoking status: Never Smoker  . Smokeless tobacco: Never Used  Substance Use Topics  . Alcohol use: Yes    Alcohol/week: 5.0 standard drinks    Types: 5 Glasses of wine per week   Marital status: Married   ROS  Review of Systems  Cardiovascular: Negative for dyspnea on exertion, chest pain, palpitations, leg swelling, and syncope.  Endocrine: Negative for cold intolerance.  Hematologic/Lymphatic: Does not bruise/bleed easily.  Musculoskeletal: Negative for joint swelling.  Gastrointestinal: Negative for abdominal pain, anorexia, change in bowel habit, hematochezia and melena.  Neurological: Negative for headaches and light-headedness.  Psychiatric/Behavioral: Negative for depression and substance abuse.  All other systems reviewed and are negative.  Objective   Vitals with BMI 12/07/2019 12/25/2018 12/25/2018  Height 5' 3.75" - -  Weight 175 lbs - -  BMI 80.99 - -  Systolic [phone]  Diastolic 96 64 84  Pulse 52 59 -    Blood pressure (!) 150/96, pulse (!) 52, resp. rate 16, height 5' 3.75" (1.619 m), weight 175 lb (79.4  kg), SpO2 96 %. Body mass index is 30.27 kg/m.   Physical Exam  Constitutional: He appears well-developed and well-nourished. No distress.  HENT:  Head: Atraumatic.  Eyes: Conjunctivae are normal.  Neck: Neck supple. No JVD present. No thyromegaly present.  Cardiovascular: Bradycardic, regular rhythm. Normal heart sounds and intact distal pulses. Exam reveals no gallop. Midsystolic murmur heard best at the apex grade I-II/VI. Pulmonary/Chest: Effort normal and breath sounds normal.  Abdominal: Soft. Bowel sounds are normal.    Musculoskeletal: Normal range of motion.  Neurological: He is alert.  Skin: Skin is warm and dry.  Psychiatric: He has a normal mood and affect.   Radiology: No results found.  Laboratory examination:   08/06/2017: Creatinine 1.19, EGFR 61/71, potassium 5.6, sodium 145, CMP otherwise normal. Cholesterol 123, triglycerides 34, HDL 67, LDL 49.   01/19/2017: TSH normal, PSA normal. Vitamin D 41.  Recent Labs    12/25/18 0943  NA 140  K 4.3  CL 108  CO2 26  GLUCOSE 109*  BUN 19  CREATININE 1.07  CALCIUM 9.0  GFRNONAA >60  GFRAA >60   CMP Latest Ref Rng & Units 12/25/2018 11/25/2014 11/24/2014  Glucose 70 - 99 mg/dL 109(H) 153(H) 130(H)  BUN 8 - 23 mg/dL _0 Creatinine 0.61 - 1.24 mg/dL 1.07 1.16 1.25(H)  Sodium 135 - 145 mmol/L 140 136 137  Potassium 3.5 - 5.1 mmol/L 4.3 4.1 4.4  Chloride 98 - 111 mmol/L 108 105 104  CO2 22 - 32 mmol/L _1 Calcium 8.9 - 10.3 mg/dL 9.0 8.4(L) 9.0  Total Protein 6.0 - 8.3 g/dL - - -  Total Bilirubin 0.3 - 1.2 mg/dL - - -  Alkaline Phos 39 - 117 U/L - - -  AST 0 - 37 U/L - - -  ALT 0 - 53 U/L - - -   CBC Latest Ref Rng & Units 12/25/2018 10/01/2015 11/25/2014  WBC 4.0 - 10.5 K/uL 5.9 5.8 7.6  Hemoglobin 13.0 - 17.0 g/dL 13.7 14.7 13.7  Hematocrit 39 - 52 % 42.8 41.9(A) 41.6  Platelets 150 - 400 K/uL 197 - 184   External Labs:   Cholesterol, total 131.000 M 08/24/2019 HDL 64.000 MG 08/24/2019 LDL-C 57.000 08/24/2019 Triglycerides 39.000 MG 08/24/2019  Hemoglobin 13.700 12/25/2018  Creatinine, Serum 0.990 MG/ 08/24/2019  ALT (SGPT) 25.000 IU/ 08/24/2019   Medications  No Known Allergies   Current Outpatient Medications on File Prior to Visit  Medication Sig Dispense Refill  . aspirin EC 81 MG EC tablet Take 1 tablet (81 mg total) by mouth daily. 30 tablet 0  . atorvastatin (LIPITOR) 80 MG tablet TAKE 1 TABLET(80 MG) BY MOUTH DAILY AT 6 PM 90 tablet 1  . carvedilol (COREG) 3.125 MG tablet TAKE 1 TABLET(3.125 MG) BY MOUTH TWICE  DAILY WITH A MEAL 180 tablet 1  . Multiple Vitamin (MULTIVITAMIN) tablet Take 1 tablet by mouth daily.     . nitroGLYCERIN (NITROSTAT) 0.4 MG SL tablet Place 1 tablet (0.4 mg total) under the tongue every 5 (five) minutes x 3 doses as needed for chest pain. 25 tablet 12   No current facility-administered medications on file prior to visit.    Cardiac Studies:   Carotid artery duplex 12/07/2014: No hemodynamically significant arterial disease in the internal carotid artery bilaterally. Mild soft plaque noted.  Coronary angiogram 11/25/2014: S/P 4.0x22 mm Resulute DES to Mid Cx. Mid RCA 40%. Mild disease LAD. LVEF 60%.  Echocardiogram 12/01/2017: Left ventricle cavity  is normal in size. Normal global wall motion. Doppler evidence of grade I (impaired) diastolic dysfunction, normal LAP. Calculated EF 63%. Trileaflet aortic valve with mild (Grade I) regurgitation. Structurally normal mitral valve with trace regurgitation. Structurally normal tricuspid valve with mild regurgitation. No evidence of pulmonary hypertension. Compared to 02/15/2015, no significant change.  Exercise sestamibi stress test 11/27/2017: 1. The patient performed treadmill exercise using Bruce protocol, completing 10:39 minutes. The patient completed an estimated workload of 11.6 METS, reaching 83% of the maximum predicted heart rate. Normal hemodynamic response. No stress symptoms reported. Excellent exercise capacity. Patient only reached 83% of age predicted maximum heart rate in spite of achieving 11.6 METS exercise. No ischemic changes seen on stress electrocardiogram on near maximal exercise. 2. The overall quality of the study is excellent. There is no evidence of abnormal lung activity. Stress and rest SPECT images demonstrate homogeneous tracer distribution throughout the myocardium. Gated SPECT imaging reveals normal myocardial thickening and wall motion. The left ventricular ejection fraction was normal (61%). 3. Low risk,  near maximal exercise nuclear stress study. Consider CTA coronary, if suspicion for obstructive CAD is high.   EKG:  EKG 12/07/2019: Marked sinus bradycardia at rate of 52 bpm, normal axis.  No evidence of ischemia otherwise normal EKG. no significant change from 12/06/2018.  Assessment     ICD-10-CM   1. Coronary artery disease involving native coronary artery of native heart without angina pectoris  I25.10 EKG 12-Lead  2. Essential hypertension  I10 olmesartan-hydrochlorothiazide (BENICAR HCT) 40-12.5 MG tablet  3. Pure hypercholesterolemia  E78.00     Medications Discontinued During This Encounter  Medication Reason  . fish oil-omega-3 fatty acids 1000 MG capsule Patient Preference  . meclizine (ANTIVERT) 25 MG tablet Patient Preference  . olmesartan (BENICAR) 40 MG tablet Change in therapy   Meds ordered this encounter  Medications  . olmesartan-hydrochlorothiazide (BENICAR HCT) 40-12.5 MG tablet    Sig: Take 1 tablet by mouth daily.    Dispense:  90 tablet    Refill:  3    Recommendations:   Ronald House is a 74 y.o. Caucasian male with history of prostate cancer, CAD with stenting to large mid Circumflex coronary artery with 4.0x22 mm Resolute DES in 2016. He has residual 50% mid RCA and 10% prox to mid LAD stenosis also noted with normal LVEF.  The patient presents today on annual visit for CAD. He remains asymptomatic, no chest pain or dyspnea. He exercises regularly without difficulty. His physical examination is unchanged. His EKG shows sinus bradycardia although he is asymptomatic with this. Otherwise normal EKG, no significant change from previous.  His blood pressure is markedly elevated today. It was elevated at his last visit, but medication changes were deferred at that time as it was well controlled per his home measurements. I will add HCTZ 12.5 mg to his current regimen of olmesartan and carvedilol. He is encouraged to monitor his blood pressure at home.      External labs were reviewed. Lipids are very well controlled.  I will see him back in clinic in one year for follow up.  Blair Heys, PA Student 12/07/19 5:04 PM  Patient seen and examined in conjunction with Blair Heys, PA second year student at [employer].  Time spent is in direct patient face to face encounter not including the teaching and training involved.    Adrian Prows, MD, [facility] 12/07/2019, 5:04 PM Office: [phone]    Patient has 69 tablets of plain olmesartan, hence  I have represcribed HCTZ only for 69 tablets so he can use up his olmesartan.

## 2019-12-07 NOTE — Addendum Note (Signed)
Addended by: Kela Millin on: 12/07/2019 05:10 PM   Modules accepted: Orders

## 2019-12-15 DIAGNOSIS — Z20828 Contact with and (suspected) exposure to other viral communicable diseases: Secondary | ICD-10-CM | POA: Diagnosis not present

## 2019-12-20 ENCOUNTER — Other Ambulatory Visit: Payer: Self-pay | Admitting: Cardiology

## 2020-02-03 DIAGNOSIS — H18511 Endothelial corneal dystrophy, right eye: Secondary | ICD-10-CM | POA: Diagnosis not present

## 2020-02-03 DIAGNOSIS — H43813 Vitreous degeneration, bilateral: Secondary | ICD-10-CM | POA: Diagnosis not present

## 2020-02-03 DIAGNOSIS — H25813 Combined forms of age-related cataract, bilateral: Secondary | ICD-10-CM | POA: Diagnosis not present

## 2020-02-03 DIAGNOSIS — H35372 Puckering of macula, left eye: Secondary | ICD-10-CM | POA: Diagnosis not present

## 2020-02-03 DIAGNOSIS — H353112 Nonexudative age-related macular degeneration, right eye, intermediate dry stage: Secondary | ICD-10-CM | POA: Diagnosis not present

## 2020-02-29 DIAGNOSIS — I10 Essential (primary) hypertension: Secondary | ICD-10-CM | POA: Diagnosis not present

## 2020-02-29 DIAGNOSIS — E785 Hyperlipidemia, unspecified: Secondary | ICD-10-CM | POA: Diagnosis not present

## 2020-02-29 DIAGNOSIS — E559 Vitamin D deficiency, unspecified: Secondary | ICD-10-CM | POA: Diagnosis not present

## 2020-02-29 DIAGNOSIS — Z8639 Personal history of other endocrine, nutritional and metabolic disease: Secondary | ICD-10-CM | POA: Diagnosis not present

## 2020-03-06 DIAGNOSIS — I5032 Chronic diastolic (congestive) heart failure: Secondary | ICD-10-CM | POA: Diagnosis not present

## 2020-03-06 DIAGNOSIS — Z8639 Personal history of other endocrine, nutritional and metabolic disease: Secondary | ICD-10-CM | POA: Diagnosis not present

## 2020-03-06 DIAGNOSIS — M810 Age-related osteoporosis without current pathological fracture: Secondary | ICD-10-CM | POA: Diagnosis not present

## 2020-03-06 DIAGNOSIS — I129 Hypertensive chronic kidney disease with stage 1 through stage 4 chronic kidney disease, or unspecified chronic kidney disease: Secondary | ICD-10-CM | POA: Diagnosis not present

## 2020-03-06 DIAGNOSIS — E559 Vitamin D deficiency, unspecified: Secondary | ICD-10-CM | POA: Diagnosis not present

## 2020-03-06 DIAGNOSIS — N486 Induration penis plastica: Secondary | ICD-10-CM | POA: Diagnosis not present

## 2020-03-06 DIAGNOSIS — E785 Hyperlipidemia, unspecified: Secondary | ICD-10-CM | POA: Diagnosis not present

## 2020-03-06 DIAGNOSIS — I2510 Atherosclerotic heart disease of native coronary artery without angina pectoris: Secondary | ICD-10-CM | POA: Diagnosis not present

## 2020-03-06 DIAGNOSIS — Z0000 Encounter for general adult medical examination without abnormal findings: Secondary | ICD-10-CM | POA: Diagnosis not present

## 2020-03-13 ENCOUNTER — Ambulatory Visit: Payer: Medicare Other | Admitting: Dermatology

## 2020-03-13 ENCOUNTER — Encounter: Payer: Self-pay | Admitting: Dermatology

## 2020-03-13 ENCOUNTER — Other Ambulatory Visit: Payer: Self-pay

## 2020-03-13 DIAGNOSIS — B351 Tinea unguium: Secondary | ICD-10-CM | POA: Diagnosis not present

## 2020-03-13 DIAGNOSIS — K120 Recurrent oral aphthae: Secondary | ICD-10-CM

## 2020-03-17 NOTE — Progress Notes (Signed)
   Follow-Up Visit   Subjective  Ronald House is a 75 y.o. male who presents for the following: Skin Problem (1 month ago patient noticed red marks on his penis that turned into a black scab then fall off also some itching. No new sexual partners. Also noticed red marks on his right inner ankle x 7-8 months no pain, no itching.  Patient has used cortisone on his penis x 2 days that seems to help some ).  Sores Location: Penis Duration:  Quality:  Associated Signs/Symptoms: Modifying Factors: Cortisone cream seems to be helping Severity:  Timing: Context:   Objective  Well appearing patient in no apparent distress; mood and affect are within normal limits. Objective  Glans of Penis (2): 2 shallow punched out 2.5 mm ulcers with shaggy yellow base.  Nontender.  No urethral discharge.  No adenopathy.  Images      A focused examination was performed including Lower abdomen, genitalia, anal, regional nodes, legs.  Oral examination.. Relevant physical exam findings are noted in the Assessment and Plan.   Assessment & Plan    Toenail fungus (2) Right 2nd Finger Medial Paronychium; Left 2nd Finger Nail Plate  Aphthous ulcer (2) Glans of Penis  Can continue topical cortisone, follow up by phone in 3-4 weeks.  I reviewed in great detail the differential diagnosis of 2 small ulcers on the head of the penis.  He is monogamous.  Denies fevers, sweats, chills, urethral discharge, abdominal pain, sores in mouth, weight loss.    I, Janalyn Harder, MD, have reviewed all documentation for this visit.  The documentation on 03/17/20 for the exam, diagnosis, procedures, and orders are all accurate and complete.

## 2020-03-19 ENCOUNTER — Other Ambulatory Visit: Payer: Self-pay | Admitting: Cardiology

## 2020-03-20 ENCOUNTER — Telehealth (INDEPENDENT_AMBULATORY_CARE_PROVIDER_SITE_OTHER): Payer: Medicare PPO | Admitting: Dermatology

## 2020-03-20 DIAGNOSIS — R21 Rash and other nonspecific skin eruption: Secondary | ICD-10-CM

## 2020-03-20 NOTE — Telephone Encounter (Signed)
Patient was told to call back if things changed with the lesion/rash in the groin area. He states that Dx was inconclusive. He states that he would like a blood test.

## 2020-03-22 ENCOUNTER — Ambulatory Visit (INDEPENDENT_AMBULATORY_CARE_PROVIDER_SITE_OTHER): Payer: Medicare PPO | Admitting: Physician Assistant

## 2020-03-22 ENCOUNTER — Other Ambulatory Visit: Payer: Self-pay

## 2020-03-22 DIAGNOSIS — D485 Neoplasm of uncertain behavior of skin: Secondary | ICD-10-CM

## 2020-03-22 DIAGNOSIS — L308 Other specified dermatitis: Secondary | ICD-10-CM | POA: Diagnosis not present

## 2020-03-22 NOTE — Progress Notes (Signed)
   Follow-Up Visit   Subjective  Ronald House is a 75 y.o. male who presents for the following: Skin Problem (Patient here today regarding inflamed spot on his penis. Per patient he would like to know what it is. Patient denies multiple partners.).   The following portions of the chart were reviewed this encounter and updated as appropriate:      Objective  Well appearing patient in no apparent distress; mood and affect are within normal limits.  A focused examination was performed including genitals. Relevant physical exam findings are noted in the Assessment and Plan.  Objective  Right Glans of Penis: 2 small hyperkeratotic crusts   Assessment & Plan  Neoplasm of uncertain behavior of skin Right Glans of Penis  Skin / nail biopsy Type of biopsy: tangential   Informed consent: discussed and consent obtained   Timeout: patient name, date of birth, surgical site, and procedure verified   Procedure prep:  Patient was prepped and draped in usual sterile fashion (Non sterile) Prep type:  Chlorhexidine Anesthesia: the lesion was anesthetized in a standard fashion   Anesthetic:  1% lidocaine w/ epinephrine 1-100,000 local infiltration Instrument used: flexible razor blade   Hemostasis achieved with: aluminum chloride   Outcome: patient tolerated procedure well   Post-procedure details: sterile dressing applied and wound care instructions given   Dressing type: petrolatum    Specimen 1 - Surgical pathology Differential Diagnosis: R/O folliculitis, edematous gland, ppp  Check Margins: No    I, Noelie Renfrow, PA-C, have reviewed all documentation's for this visit.  The documentation on 03/22/20 for the exam, diagnosis, procedures and orders are all accurate and complete.

## 2020-03-22 NOTE — Patient Instructions (Signed)

## 2020-03-23 NOTE — Telephone Encounter (Signed)
As per our discussion during his visit, there is no blood test for diagnosis of genital aphthous ulcers. Given the clinical presentation and his history of no extramarital sexual partner, Dr. Denna Haggard does not feel that an STD is likely but his primary care doctor can do evaluation for these if he chooses.

## 2020-03-29 ENCOUNTER — Telehealth: Payer: Self-pay | Admitting: Physician Assistant

## 2020-03-29 NOTE — Telephone Encounter (Signed)
Patient is calling for pathology results from last visit with Kelli Sheffield, PA-C. 

## 2020-03-30 NOTE — Telephone Encounter (Signed)
Phone call to patient to inform him that I spoke with [facility] and his results are still pending.  Patient aware.

## 2020-03-30 NOTE — Telephone Encounter (Signed)
Phone call to [facility] regarding patient's pathology results.  Per Chantel with [facility] the patient's pathology results are still pending.  [place] aware.

## 2020-04-04 ENCOUNTER — Telehealth: Payer: Self-pay | Admitting: Physician Assistant

## 2020-04-04 ENCOUNTER — Encounter: Payer: Self-pay | Admitting: Physician Assistant

## 2020-04-04 ENCOUNTER — Telehealth: Payer: Self-pay | Admitting: Dermatology

## 2020-04-04 NOTE — Telephone Encounter (Signed)
Provided pathology report to patient personally. Per patient: biopsy wound was healing well with residual redness. He also indicated that the other lesion was gone. He was using vaseline only. Patient will call prn if the issue returns.

## 2020-04-04 NOTE — Telephone Encounter (Signed)
Patient calling for biopsy results

## 2020-04-04 NOTE — Telephone Encounter (Signed)
Informed patient waiting on provider to review results and advise

## 2020-05-05 ENCOUNTER — Other Ambulatory Visit: Payer: Self-pay | Admitting: Cardiology

## 2020-05-23 ENCOUNTER — Ambulatory Visit: Payer: Medicare PPO | Admitting: Dermatology

## 2020-05-23 ENCOUNTER — Encounter: Payer: Self-pay | Admitting: Dermatology

## 2020-05-23 ENCOUNTER — Other Ambulatory Visit: Payer: Self-pay

## 2020-05-23 DIAGNOSIS — L812 Freckles: Secondary | ICD-10-CM

## 2020-05-23 DIAGNOSIS — Z1283 Encounter for screening for malignant neoplasm of skin: Secondary | ICD-10-CM | POA: Diagnosis not present

## 2020-05-23 DIAGNOSIS — L738 Other specified follicular disorders: Secondary | ICD-10-CM

## 2020-05-23 DIAGNOSIS — L821 Other seborrheic keratosis: Secondary | ICD-10-CM | POA: Diagnosis not present

## 2020-05-23 DIAGNOSIS — L570 Actinic keratosis: Secondary | ICD-10-CM

## 2020-05-23 DIAGNOSIS — L817 Pigmented purpuric dermatosis: Secondary | ICD-10-CM | POA: Diagnosis not present

## 2020-05-23 DIAGNOSIS — L918 Other hypertrophic disorders of the skin: Secondary | ICD-10-CM | POA: Diagnosis not present

## 2020-05-23 DIAGNOSIS — D1801 Hemangioma of skin and subcutaneous tissue: Secondary | ICD-10-CM

## 2020-05-23 DIAGNOSIS — H0012 Chalazion right lower eyelid: Secondary | ICD-10-CM

## 2020-06-11 ENCOUNTER — Encounter: Payer: Self-pay | Admitting: Dermatology

## 2020-06-11 NOTE — Progress Notes (Signed)
   Follow-Up Visit   Subjective  Ronald House is a 75 y.o. male who presents for the following: Annual Exam (NO NEW CONCERNS).  General skin examination Location:  Duration:  Quality:  Associated Signs/Symptoms: Modifying Factors:  Severity:  Timing: Context:   Objective  Well appearing patient in no apparent distress; mood and affect are within normal limits. Objective  Head to Legs: Head to legs exam today no signs of atypical moles, melanoma or non mole skin cancer.  Objective  Chest - Medial [facility]), Right Upper Back, Right Upper Eyelid: Vascular red 1-72mm dots  Objective  Left Anterior Neck, Left Buccal Cheek, Left Lower Leg - Posterior, Mid Back, Right Thigh - Posterior: Flattopped textured 3 to 8 mm brown papules  Objective  Left Axilla, Right Axilla: Fleshy, skin-colored sessile and pedunculated papules.    Objective  Glabella: Under the skin bump  Objective  Right Lower Leg - Anterior: Erythematous patches with gritty scale.  Objective  Right Lower Leg - Anterior: Vascular    A full examination was performed including scalp, head, eyes, ears, nose, lips, neck, chest, axillae, abdomen, back, buttocks, bilateral upper extremities, bilateral lower extremities, hands, feet, fingers, toes, fingernails, and toenails. All findings within normal limits unless otherwise noted below.   Assessment & Plan    Skin exam for malignant neoplasm Head to Legs  Yearly skin check  Hemangioma of skin (3) Chest - Medial [facility]); Right Upper Back; Right Upper Eyelid  Benign okay to leave.  Seborrheic keratosis (5) Right Thigh - Posterior; Left Lower Leg - Posterior; Mid Back; Left Buccal Cheek; Left Anterior Neck  Benign okay to leave.  Skin tag (2) Left Axilla; Right Axilla  Benign okay to leave unless patient would like snipped.  Sebaceous hyperplasia Glabella  Benign okay to leave unless   Chalazion of right lower eyelid Right Lower Eyelid  If  gets worse patient will need to contact his eye Doctor to have it treated.  Freckles Gluteal Crease  Benign okay to leave.  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      I, Lavonna Monarch, MD, have reviewed all documentation for this visit.  The documentation on 06/11/20 for the exam, diagnosis, procedures, and orders are all accurate and complete.

## 2020-07-09 ENCOUNTER — Ambulatory Visit: Payer: Medicare PPO | Admitting: Dermatology

## 2020-07-09 ENCOUNTER — Encounter: Payer: Self-pay | Admitting: Dermatology

## 2020-07-09 ENCOUNTER — Other Ambulatory Visit: Payer: Self-pay

## 2020-07-09 DIAGNOSIS — B351 Tinea unguium: Secondary | ICD-10-CM

## 2020-07-09 MED ORDER — TERBINAFINE HCL 250 MG PO TABS: 250.0000 mg | ORAL_TABLET | Freq: Every day | ORAL | 1 refills | Status: DC

## 2020-07-10 LAB — COMPREHENSIVE METABOLIC PANEL
AG Ratio: 1.9 (calc) (ref 1.0–2.5)
ALT: 37 U/L (ref 9–46)
AST: 31 U/L (ref 10–35)
Albumin: 4.5 g/dL (ref 3.6–5.1)
Alkaline phosphatase (APISO): 86 U/L (ref 35–144)
BUN: 24 mg/dL (ref 7–25)
CO2: 30 mmol/L (ref 20–32)
Calcium: 9.7 mg/dL (ref 8.6–10.3)
Chloride: 104 mmol/L (ref 98–110)
Creat: 1.16 mg/dL (ref 0.70–1.18)
Globulin: 2.4 g/dL (calc) (ref 1.9–3.7)
Glucose, Bld: 88 mg/dL (ref 65–139)
Potassium: 4.5 mmol/L (ref 3.5–5.3)
Sodium: 141 mmol/L (ref 135–146)
Total Bilirubin: 0.6 mg/dL (ref 0.2–1.2)
Total Protein: 6.9 g/dL (ref 6.1–8.1)

## 2020-07-10 LAB — CBC WITH DIFFERENTIAL/PLATELET
Absolute Monocytes: 564 cells/uL (ref 200–950)
Basophils Absolute: 19 cells/uL (ref 0–200)
Basophils Relative: 0.3 %
Eosinophils Absolute: 130 cells/uL (ref 15–500)
Eosinophils Relative: 2.1 %
HCT: 46 % (ref 38.5–50.0)
Hemoglobin: 15 g/dL (ref 13.2–17.1)
Lymphs Abs: 1984 cells/uL (ref 850–3900)
MCH: 30.5 pg (ref 27.0–33.0)
MCHC: 32.6 g/dL (ref 32.0–36.0)
MCV: 93.7 fL (ref 80.0–100.0)
MPV: 11.4 fL (ref 7.5–12.5)
Monocytes Relative: 9.1 %
Neutro Abs: 3503 cells/uL (ref 1500–7800)
Neutrophils Relative %: 56.5 %
Platelets: 204 10*3/uL (ref 140–400)
RBC: 4.91 10*6/uL (ref 4.20–5.80)
RDW: 12.5 % (ref 11.0–15.0)
Total Lymphocyte: 32 %
WBC: 6.2 10*3/uL (ref 3.8–10.8)

## 2020-07-11 LAB — POCT SKIN KOH: Skin KOH, POC: NEGATIVE

## 2020-07-16 ENCOUNTER — Telehealth: Payer: Self-pay | Admitting: Dermatology

## 2020-07-16 NOTE — Telephone Encounter (Signed)
Needs results of blood test before picking up med (doen't know name) for nails to make sure it's safe for him to take it

## 2020-07-16 NOTE — Telephone Encounter (Signed)
Phone call to patient with his lab results and Dr. Tafeen's recommendations. Patient aware.  

## 2020-07-17 ENCOUNTER — Telehealth: Payer: Self-pay

## 2020-07-17 NOTE — Telephone Encounter (Signed)
Patient called that he recently got prescribed terbinafine for nail fungus and has not taken it because he wants to know if it is all right for him to take it please advise

## 2020-07-18 ENCOUNTER — Encounter: Payer: Self-pay | Admitting: Dermatology

## 2020-07-18 NOTE — Progress Notes (Signed)
   Follow-Up Visit   Subjective  Ronald House is a 75 y.o. male who presents for the following: Nail Problem (Finger nails and toenail fungus patient want to talk about treatment options).  Fungus infection now affecting most fingernails on both hands as well as both feet.  Mr. Tuman strongly desires treatment. Location:  Duration:  Quality:  Associated Signs/Symptoms: Modifying Factors:  Severity:  Timing: Context:   Objective  Well appearing patient in no apparent distress; mood and affect are within normal limits. Objective  Left 2nd Finger Nail Plate, Left 2nd Proximal Nail fold of Toe, Left 3rd Toe Nail Plate, Left Hallux Toe Nail Plate, Left Thumb Proximal Nail Fold, Right 2nd Finger Nail Plate, Right 2nd Proximal Nail fold of Toe, Right 3rd Lateral Paronychium of Toe, Right Hallux Toe Nail Plate, Right Thumb Lateral Paronychium: Clinically typical distal nail brittleness, nonopacification, mild and onycholysis, subungual debris.  Clinically typical distal nail slight lifting, subungual debris, opacification, thickening and brittleness.    A focused examination was performed including Hands and feet.. Relevant physical exam findings are noted in the Assessment and Plan.   Assessment & Plan    Toenail fungus (10) Left 2nd Finger Nail Plate; Right 2nd Finger Nail Plate; Left Thumb Proximal Nail Fold; Right Thumb Lateral Paronychium; Left Hallux Toe Nail Plate; Right Hallux Toe Nail Plate; Left 2nd Proximal Nail fold of Toe; Right 2nd Proximal Nail fold of Toe; Right 3rd Lateral Paronychium of Toe; Left 3rd Toe Nail Plate  All treatment options reviewed in detail with Ronald House along with the option of simply living with this common disorder.  He did strongly wish to try and get his nails improved (particularly his fingernails).  He understands the remote risk of a serious idiosyncratic reaction from an oral antifungal.  Patient is obtain labs and then start terbinafine. Follow  up in 6-8 weeks.  CMP - Left 2nd Finger Nail Plate, Left 2nd Proximal Nail fold of Toe, Left 3rd Toe Nail Plate, Left Hallux Toe Nail Plate, Left Thumb Proximal Nail Fold, Right 2nd Finger Nail Plate, Right 2nd Proximal Nail fold of Toe, Right 3rd Lateral Paronychium of Toe, Right Hallux Toe Nail Plate, Right Thumb Lateral Paronychium  CBC with Differential/Platelets - Left 2nd Finger Nail Plate, Left 2nd Proximal Nail fold of Toe, Left 3rd Toe Nail Plate, Left Hallux Toe Nail Plate, Left Thumb Proximal Nail Fold, Right 2nd Finger Nail Plate, Right 2nd Proximal Nail fold of Toe, Right 3rd Lateral Paronychium of Toe, Right Hallux Toe Nail Plate, Right Thumb Lateral Paronychium  Other Related Procedures POCT Skin KOH  Ordered Medications: terbinafine (LAMISIL) 250 MG tablet      I, Lavonna Monarch, MD, have reviewed all documentation for this visit.  The documentation on 07/18/20 for the exam, diagnosis, procedures, and orders are all accurate and complete.

## 2020-07-18 NOTE — Telephone Encounter (Signed)
Patient is aware 

## 2020-08-03 DIAGNOSIS — H25813 Combined forms of age-related cataract, bilateral: Secondary | ICD-10-CM | POA: Diagnosis not present

## 2020-08-03 DIAGNOSIS — H18511 Endothelial corneal dystrophy, right eye: Secondary | ICD-10-CM | POA: Diagnosis not present

## 2020-08-03 DIAGNOSIS — H35372 Puckering of macula, left eye: Secondary | ICD-10-CM | POA: Diagnosis not present

## 2020-08-03 DIAGNOSIS — H353112 Nonexudative age-related macular degeneration, right eye, intermediate dry stage: Secondary | ICD-10-CM | POA: Diagnosis not present

## 2020-08-15 DIAGNOSIS — C61 Malignant neoplasm of prostate: Secondary | ICD-10-CM | POA: Diagnosis not present

## 2020-08-21 ENCOUNTER — Encounter: Payer: Self-pay | Admitting: Dermatology

## 2020-08-21 ENCOUNTER — Ambulatory Visit (INDEPENDENT_AMBULATORY_CARE_PROVIDER_SITE_OTHER): Payer: Medicare PPO | Admitting: Dermatology

## 2020-08-21 ENCOUNTER — Other Ambulatory Visit: Payer: Self-pay

## 2020-08-21 DIAGNOSIS — B351 Tinea unguium: Secondary | ICD-10-CM

## 2020-08-21 LAB — CBC WITH DIFFERENTIAL/PLATELET
Absolute Monocytes: 620 cells/uL (ref 200–950)
Basophils Absolute: 28 cells/uL (ref 0–200)
Basophils Relative: 0.3 %
Eosinophils Absolute: 132 cells/uL (ref 15–500)
Eosinophils Relative: 1.4 %
HCT: 45 % (ref 38.5–50.0)
Hemoglobin: 14.3 g/dL (ref 13.2–17.1)
Lymphs Abs: 2331 cells/uL (ref 850–3900)
MCH: 29.9 pg (ref 27.0–33.0)
MCHC: 31.8 g/dL — ABNORMAL LOW (ref 32.0–36.0)
MCV: 93.9 fL (ref 80.0–100.0)
MPV: 11.8 fL (ref 7.5–12.5)
Monocytes Relative: 6.6 %
Neutro Abs: 6289 cells/uL (ref 1500–7800)
Neutrophils Relative %: 66.9 %
Platelets: 211 10*3/uL (ref 140–400)
RBC: 4.79 10*6/uL (ref 4.20–5.80)
RDW: 12.3 % (ref 11.0–15.0)
Total Lymphocyte: 24.8 %
WBC: 9.4 10*3/uL (ref 3.8–10.8)

## 2020-08-21 LAB — COMPREHENSIVE METABOLIC PANEL
AG Ratio: 2 (calc) (ref 1.0–2.5)
ALT: 15 U/L (ref 9–46)
AST: 19 U/L (ref 10–35)
Albumin: 4.6 g/dL (ref 3.6–5.1)
Alkaline phosphatase (APISO): 77 U/L (ref 35–144)
BUN/Creatinine Ratio: 22 (calc) (ref 6–22)
BUN: 28 mg/dL — ABNORMAL HIGH (ref 7–25)
CO2: 29 mmol/L (ref 20–32)
Calcium: 9.7 mg/dL (ref 8.6–10.3)
Chloride: 104 mmol/L (ref 98–110)
Creat: 1.25 mg/dL — ABNORMAL HIGH (ref 0.70–1.18)
Globulin: 2.3 g/dL (calc) (ref 1.9–3.7)
Glucose, Bld: 73 mg/dL (ref 65–139)
Potassium: 4.5 mmol/L (ref 3.5–5.3)
Sodium: 141 mmol/L (ref 135–146)
Total Bilirubin: 0.7 mg/dL (ref 0.2–1.2)
Total Protein: 6.9 g/dL (ref 6.1–8.1)

## 2020-08-21 MED ORDER — TERBINAFINE HCL 250 MG PO TABS: 250.0000 mg | ORAL_TABLET | Freq: Every day | ORAL | 1 refills | Status: DC

## 2020-08-22 ENCOUNTER — Telehealth: Payer: Self-pay

## 2020-08-22 DIAGNOSIS — R3915 Urgency of urination: Secondary | ICD-10-CM | POA: Diagnosis not present

## 2020-08-22 DIAGNOSIS — Z8546 Personal history of malignant neoplasm of prostate: Secondary | ICD-10-CM | POA: Diagnosis not present

## 2020-08-22 DIAGNOSIS — R350 Frequency of micturition: Secondary | ICD-10-CM | POA: Diagnosis not present

## 2020-08-22 DIAGNOSIS — K921 Melena: Secondary | ICD-10-CM | POA: Diagnosis not present

## 2020-08-22 DIAGNOSIS — N401 Enlarged prostate with lower urinary tract symptoms: Secondary | ICD-10-CM | POA: Diagnosis not present

## 2020-08-22 DIAGNOSIS — N5201 Erectile dysfunction due to arterial insufficiency: Secondary | ICD-10-CM | POA: Diagnosis not present

## 2020-08-22 NOTE — Telephone Encounter (Signed)
-----   Message from Lavonna Monarch, MD sent at 08/22/2020  7:53 AM EDT ----- Faythe Ghee to refill and complete oral terbinafine.

## 2020-08-22 NOTE — Telephone Encounter (Signed)
Phone call to patient with his lab results and Dr. Onalee Hua recommendations. Patient aware.

## 2020-08-26 ENCOUNTER — Encounter: Payer: Self-pay | Admitting: Dermatology

## 2020-08-26 NOTE — Progress Notes (Signed)
   Follow-Up Visit   Subjective  Ronald House is a 75 y.o. male who presents for the following: Follow-up (6 week follow up for Terbinafine use per patient he can't see if toenails so he can't tell if there's any improvement. Per patient his fingernails seem to be improving. ).  Enea fingernails and toenails Location:  Duration:  Quality:  Associated Signs/Symptoms: Modifying Factors:  Severity:  Timing: Context:   Objective  Well appearing patient in no apparent distress; mood and affect are within normal limits. hands and feet Left 2nd Finger Nail Plate; Right 2nd Finger Nail Plate; Left Thumb Proximal Nail Fold; Right Thumb Lateral Paronychium; Left Hallux Toe Nail Plate; Right Hallux Toe Nail Plate; Left 2nd Proximal Nail fold of Toe; Right 2nd Proximal Nail fold of Toe; Right 3rd Lateral Paronychium of Toe; Left 3rd Toe Nail Plate And I agree that there is already clinical improvement of his fingernails with distal extension of normal-appearing nail.  He is having no rash or leg swelling or other side effects on the terbinafine.    A focused examination was performed including hands and feet.. Relevant physical exam findings are noted in the Assessment and Plan.   Assessment & Plan    Tinea unguium hands and feet  Check labs, if okay he will complete a 12week total course of terbinafine and we can judge the effect on the toenails roughly 9 months later.  He had I agree that this will be the final time he takes oral antifungal's. He is aware of the significant recurrence rate.  CBC with Differential/Platelets - hands and feet  CMP - hands and feet  terbinafine (LAMISIL) 250 MG tablet - hands and feet Take 1 tablet (250 mg total) by mouth daily.      I, Lavonna Monarch, MD, have reviewed all documentation for this visit.  The documentation on 08/26/20 for the exam, diagnosis, procedures, and orders are all accurate and complete.

## 2020-09-11 IMAGING — MR MR HEAD W/O CM
12 of 13 series · 44 of 48 positions shown · non-contrast
Comparison: 01/30/2013

CLINICAL DATA: Persistent vertigo

EXAM:
MRI HEAD WITHOUT CONTRAST
TECHNIQUE: Multiplanar, multiecho pulse sequences of the brain and surrounding
structures were obtained without intravenous contrast.

[Series 5: DWI · axial · 3.0mm · 0.92mm/px · z∈[-68,+81]mm · 8 of 104 slices shown (1 of 4)]
[im 1/104]
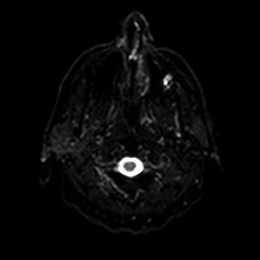
[im 15/104]
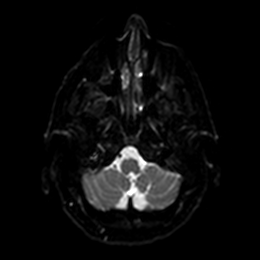
[im 30/104]
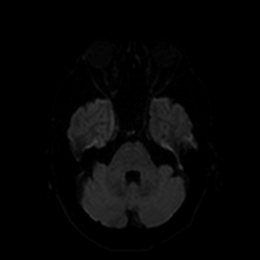
[im 45/104]
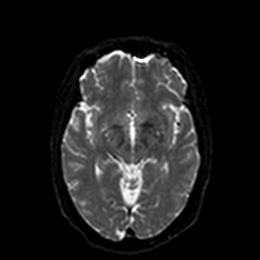
[im 59/104]
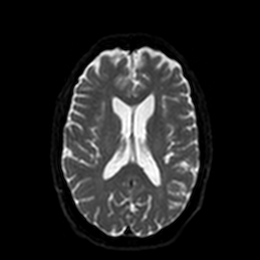
[im 74/104]
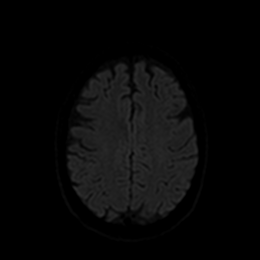
[im 89/104]
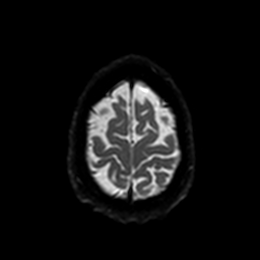
[im 104/104]
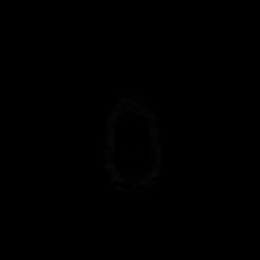

[Series 6: DWI · axial · 3.0mm · 0.92mm/px · z∈[-68,+81]mm · 4 of 51 slices shown (2 of 4)]
[im 1/51]
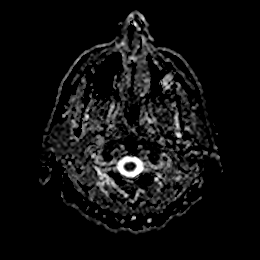
[im 17/51]
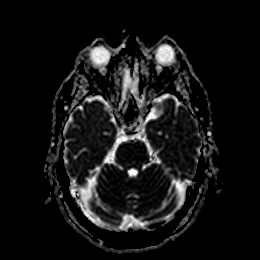
[im 34/51]
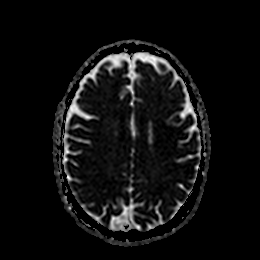
[im 51/51]
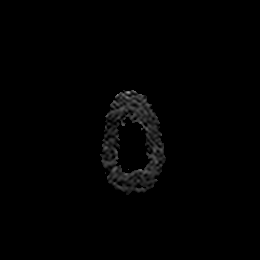

[Series 7: DWI · coronal · 4.0mm · 0.88mm/px · 6 of 80 slices shown (3 of 4)]
[im 1/80]
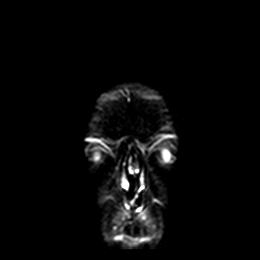
[im 16/80]
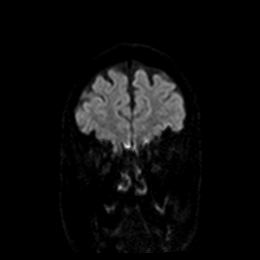
[im 32/80]
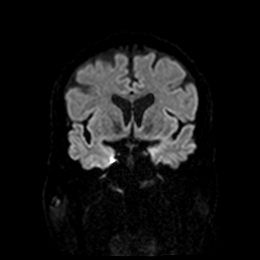
[im 48/80]
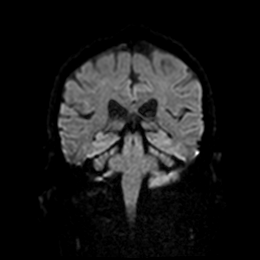
[im 64/80]
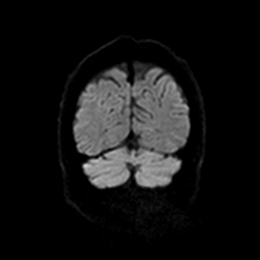
[im 80/80]
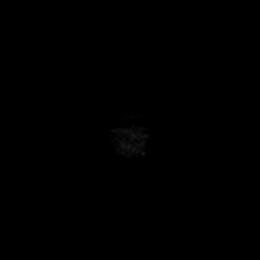

[Series 8: DWI · coronal · 4.0mm · 0.88mm/px · 3 of 40 slices shown (4 of 4)]
[im 1/40]
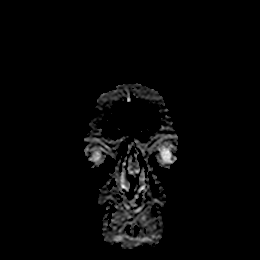
[im 20/40]
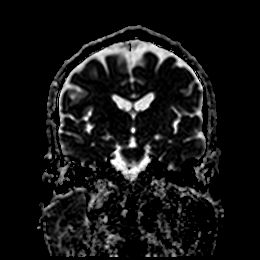
[im 40/40]
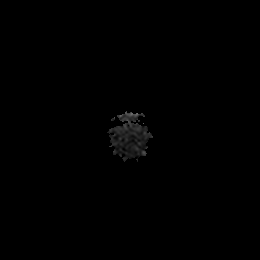

[Series 9: FLAIR · axial · 5.0mm · 0.47mm/px · z∈[-73,+78]mm · 2 of 27 slices shown]
[im 1/27]
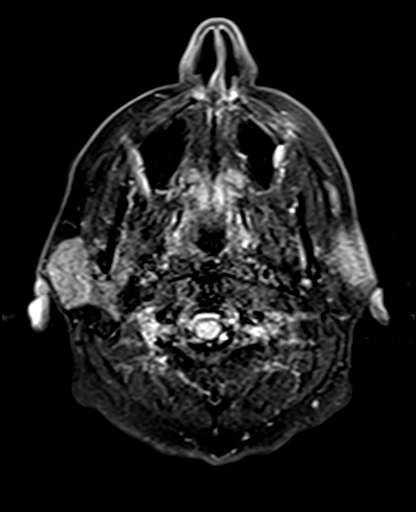
[im 27/27]
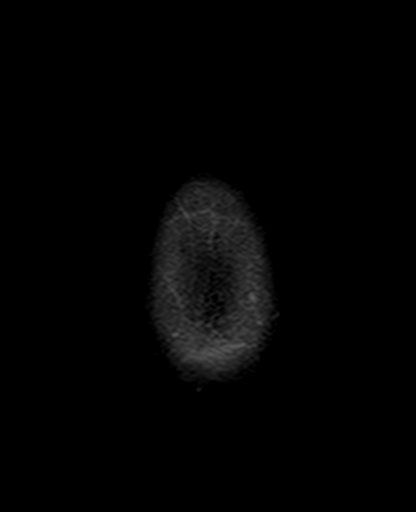

[Series 10: mag_images · axial · 3.0mm · 0.94mm/px · z∈[-72,+77]mm · 4 of 52 slices shown]
[im 1/52]
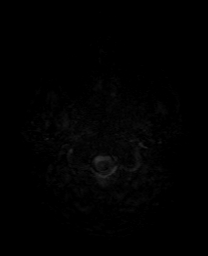
[im 18/52]
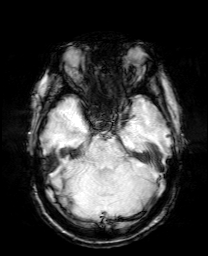
[im 35/52]
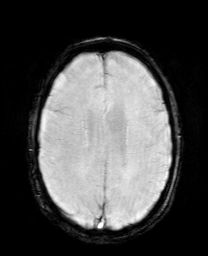
[im 52/52]
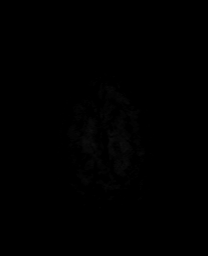

[Series 11: pha_images · axial · 3.0mm · 0.94mm/px · z∈[-72,+77]mm · 4 of 52 slices shown]
[im 1/52]
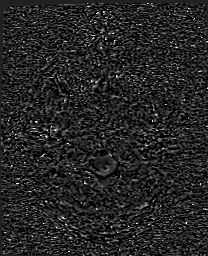
[im 18/52]
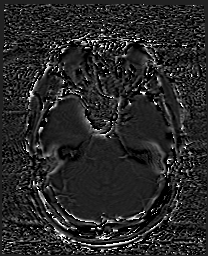
[im 35/52]
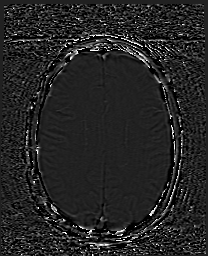
[im 52/52]
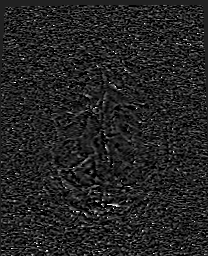

[Series 12: swi_images · axial · 3.0mm · 0.94mm/px · z∈[-72,+77]mm · 4 of 52 slices shown]
[im 1/52]
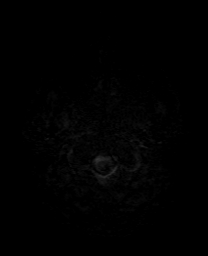
[im 18/52]
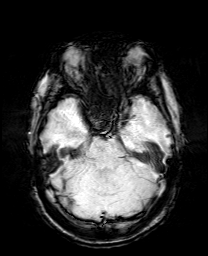
[im 35/52]
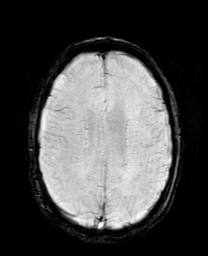
[im 52/52]
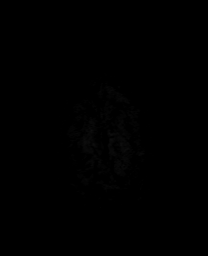

[Series 13: mip_images(sw) · axial · 24.0mm · 0.94mm/px · z∈[-61,+66]mm · 3 of 45 slices shown]
[im 1/45]
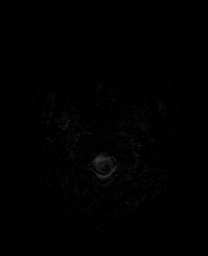
[im 23/45]
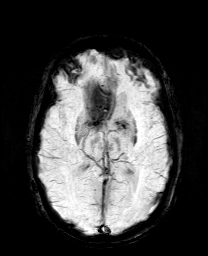
[im 45/45]
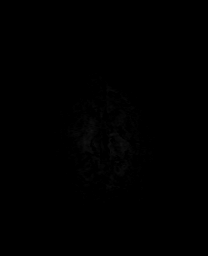

[Series 14: T1 · sagittal · 5.0mm · 0.75mm/px · 2 of 25 slices shown]
[im 1/25]
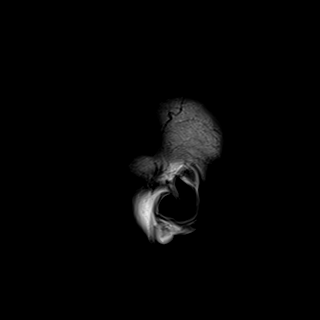
[im 25/25]
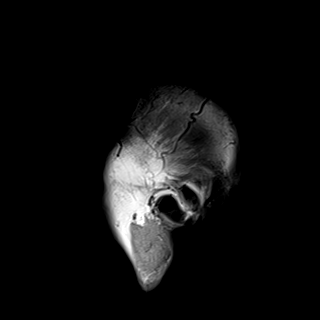

[Series 15: T2 · axial · 5.0mm · 0.75mm/px · z∈[-69,+82]mm · 2 of 27 slices shown (1 of 2)]
[im 1/27]
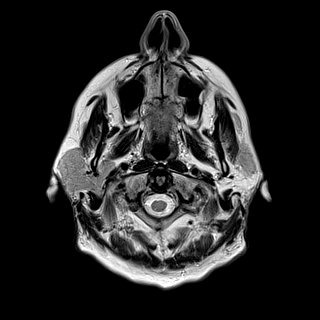
[im 27/27]
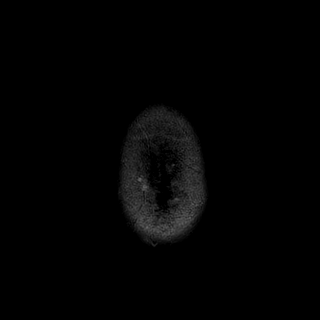

[Series 17: T2 · coronal · 5.0mm · 0.34mm/px · 2 of 33 slices shown (2 of 2)]
[im 1/33]
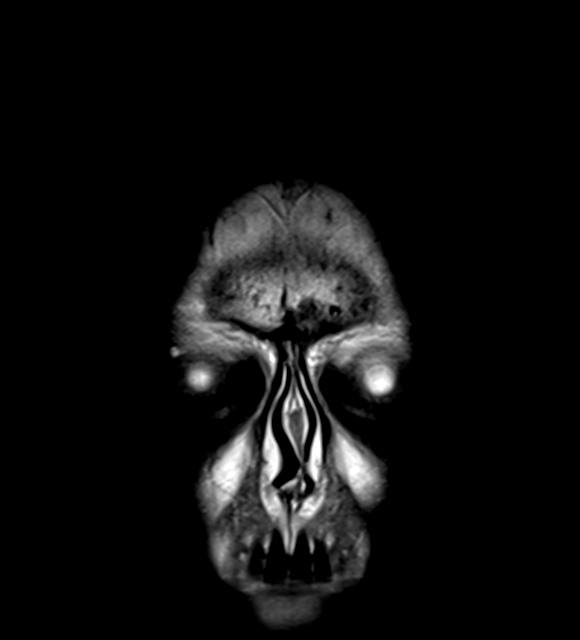
[im 33/33]
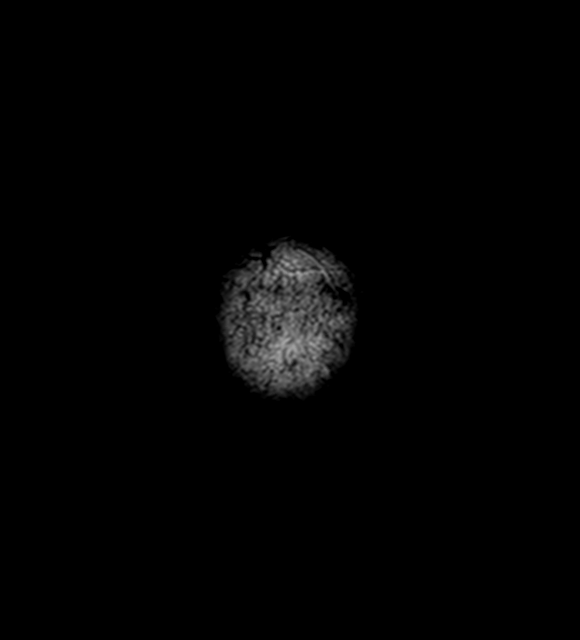

[44 of 48 positions shown; findings below may reference images not displayed]

FINDINGS: Brain: The brain has a normal appearance without evidence of
malformation, accelerated atrophy, old or acute small or large
vessel infarction, mass lesion, hemorrhage, hydrocephalus or
extra-axial collection. There is only mild age related volume loss
and a few punctate foci of T2 and FLAIR signal within the frontal
white matter, both expected at this age. CP angle regions appear
normal on this standard noncontrast exam.

Vascular: Major vessels at the base of the brain show flow. Venous
sinuses appear patent.

Skull and upper cervical spine: Normal.

Sinuses/Orbits: Clear/normal.

Other: None significant.
IMPRESSION: Normal examination for age. No abnormality seen to explain the
presenting symptoms.

## 2020-09-14 ENCOUNTER — Other Ambulatory Visit: Payer: Self-pay | Admitting: Dermatology

## 2020-09-14 DIAGNOSIS — B351 Tinea unguium: Secondary | ICD-10-CM

## 2020-09-15 ENCOUNTER — Other Ambulatory Visit: Payer: Self-pay | Admitting: Cardiology

## 2020-09-18 NOTE — Telephone Encounter (Signed)
Patient needs an appointment

## 2020-10-30 ENCOUNTER — Telehealth: Payer: Self-pay | Admitting: Dermatology

## 2020-10-30 NOTE — Telephone Encounter (Signed)
Patient has been on Terbinafine HCI 250 mg for a toenail fungus and he had labs done before starting prescription and 1 month after starting.  Patient has finished the course of medication and now wants to know if he needs labs after finishing the medication.

## 2020-10-30 NOTE — Telephone Encounter (Signed)
Per Dr Denna Haggard No need to repeat labs patient completed the 3 month therapy

## 2020-11-02 ENCOUNTER — Other Ambulatory Visit: Payer: Self-pay | Admitting: Cardiology

## 2020-12-06 ENCOUNTER — Other Ambulatory Visit: Payer: Self-pay

## 2020-12-06 ENCOUNTER — Ambulatory Visit: Payer: Medicare PPO | Admitting: Cardiology

## 2020-12-06 ENCOUNTER — Encounter: Payer: Self-pay | Admitting: Cardiology

## 2020-12-06 VITALS — BP 129/66 | HR 54 | Temp 97.8°F | Resp 16 | Ht 70.0 in | Wt 172.0 lb

## 2020-12-06 DIAGNOSIS — E7800 Pure hypercholesterolemia, unspecified: Secondary | ICD-10-CM

## 2020-12-06 DIAGNOSIS — I2510 Atherosclerotic heart disease of native coronary artery without angina pectoris: Secondary | ICD-10-CM

## 2020-12-06 DIAGNOSIS — I10 Essential (primary) hypertension: Secondary | ICD-10-CM | POA: Diagnosis not present

## 2020-12-06 NOTE — Progress Notes (Signed)
Primary Physician/Referring:  [name], MD  Patient ID: Ronald House, male    DOB: 01-Apr-1945, 75 y.o.   MRN: [id number]  Chief Complaint  Patient presents with   Coronary Artery Disease   Hypertension   Follow-up    1 year   HPI:    Ronald House  is a 75 y.o. Caucasian male with history of prostate cancer, CAD with stenting to large mid Circumflex coronary artery with 4.0x22 mm Resolute DES in 2016. He has residual 50% mid RCA and 10% prox to mid LAD stenosis also noted with normal LVEF.  The patient presents for 1 year follow up visit for CAD and hypertension. He is doing well, no specific complaints.  He exercises regularly walking 2 miles per day without difficulty. Denies chest pain, dyspnea, lightheadedness, syncope.  Past Medical History:  Diagnosis Date   Anxiety    Arteriosclerosis of both carotid arteries 06/02/2018   Arthritis    "lower back" (11/24/2014)   Bulging lumbar disc    Chronic lower back pain    Coronary artery disease    DDD (degenerative disc disease), lumbar    Dyspnea on exertion 06/02/2018   GERD (gastroesophageal reflux disease)    Hematuria    Peyronie disease    Prostate cancer ([place])    Undiagnosed cardiac murmurs    childhood/unknown if present issue   Past Surgical History:  Procedure Laterality Date   CARDIAC CATHETERIZATION N/A 11/24/2014   Procedure: Left Heart Cath and Coronary Angiography;  Surgeon: Adrian Prows, MD;  Location: [facility];  Service: Cardiovascular;  Laterality: N/A;   CARDIAC CATHETERIZATION N/A 11/24/2014   Procedure: Coronary Stent Intervention;  Surgeon: Adrian Prows, MD;  Location: [facility];  Service: Cardiovascular;  Laterality: N/A;   CORONARY ANGIOPLASTY WITH STENT PLACEMENT  11/24/2014   "1 stent"   FINGER FRACTURE SURGERY Right 1980's   index finger   FRACTURE SURGERY     OPEN TREATMENT ZYGOMATIC ARCH FRACTURE  ~ [place] BIOPSY  <05/2011   RADIOACTIVE SEED IMPLANT  05/22/2011    Procedure: RADIOACTIVE SEED IMPLANT;  Surgeon: Ailene Rud, MD;  Location: [facility];  Service: Urology;  Laterality: N/A;  RAD TECH OK PER [facility]    TONSILLECTOMY AND ADENOIDECTOMY  1950's    Social History   Tobacco Use   Smoking status: Never   Smokeless tobacco: Never  Substance Use Topics   Alcohol use: Yes    Alcohol/week: 5.0 standard drinks    Types: 5 Glasses of wine per week    Comment: occasionally   Marital status: Married   ROS   Review of Systems  Cardiovascular:  Negative for chest pain, dyspnea on exertion and leg swelling.  Gastrointestinal:  Negative for melena.    Objective   Vitals with BMI 12/06/2020 12/07/2019 12/25/2018  Height 5\' 10"  5' 3.75" -  Weight 172 lbs 175 lbs -  BMI 64.40 34.74 -  Systolic 259 563 875  Diastolic 66 96 64  Pulse 54 52 59    Blood pressure 129/66, pulse (!) 54, temperature 97.8 F (36.6 C), temperature source Temporal, resp. rate 16, height 5\' 10"  (1.778 m), weight 172 lb (78 kg), SpO2 (!) 16 %. Body mass index is 24.68 kg/m.   \Physical Exam Neck:     Vascular: No carotid bruit or JVD.  Cardiovascular:     Rate and Rhythm: Normal rate and regular rhythm.  Pulses: Intact distal pulses.     Heart sounds: Normal heart sounds. No murmur heard.   No gallop.  Pulmonary:     Effort: Pulmonary effort is normal.     Breath sounds: Normal breath sounds.  Abdominal:     General: Bowel sounds are normal.     Palpations: Abdomen is soft.  Musculoskeletal:        General: No swelling.   Radiology: No results found.  Laboratory examination:   Recent Labs    07/09/20 1130 08/21/20 1312  NA 141 141  K 4.5 4.5  CL 104 104  CO2 30 29  GLUCOSE 88 73  BUN 24 28*  CREATININE 1.16 1.25*  CALCIUM 9.7 9.7   CMP Latest Ref Rng & Units 08/21/2020 07/09/2020 12/25/2018  Glucose 65 - 139 mg/dL 73 88 109(H)  BUN 7 - 25 mg/dL 28(H) 24 19  Creatinine 0.70 - 1.18 mg/dL 1.25(H) 1.16 1.07   Sodium 135 - 146 mmol/L 141 141 140  Potassium 3.5 - 5.3 mmol/L 4.5 4.5 4.3  Chloride 98 - 110 mmol/L 104 104 108  CO2 20 - 32 mmol/L 29 30 26   Calcium 8.6 - 10.3 mg/dL 9.7 9.7 9.0  Total Protein 6.1 - 8.1 g/dL 6.9 6.9 -  Total Bilirubin 0.2 - 1.2 mg/dL 0.7 0.6 -  Alkaline Phos 39 - 117 U/L - - -  AST 10 - 35 U/L 19 31 -  ALT 9 - 46 U/L 15 37 -   CBC Latest Ref Rng & Units 08/21/2020 07/09/2020 12/25/2018  WBC 3.8 - 10.8 Thousand/uL 9.4 6.2 5.9  Hemoglobin 13.2 - 17.1 g/dL 14.3 15.0 13.7  Hematocrit 38.5 - 50.0 % 45.0 46.0 42.8  Platelets 140 - 400 Thousand/uL 211 204 197   External Labs:   Cholesterol, total 131.000 M 08/24/2019 HDL 64.000 MG 08/24/2019 LDL-C 57.000 08/24/2019 Triglycerides 39.000 MG 08/24/2019  Hemoglobin 13.700 12/25/2018  Creatinine, Serum 0.990 MG/ 08/24/2019  ALT (SGPT) 25.000 IU/ 08/24/2019   Medications  No Known Allergies   Current Outpatient Medications on File Prior to Visit  Medication Sig Dispense Refill   aspirin EC 81 MG EC tablet Take 1 tablet (81 mg total) by mouth daily. 30 tablet 0   atorvastatin (LIPITOR) 80 MG tablet TAKE 1 TABLET(80 MG) BY MOUTH DAILY AT 6 PM 90 tablet 1   carvedilol (COREG) 3.125 MG tablet TAKE 1 TABLET(3.125 MG) BY MOUTH TWICE DAILY WITH A MEAL 180 tablet 1   Multiple Vitamin (MULTIVITAMIN) tablet Take 1 tablet by mouth daily.     nitroGLYCERIN (NITROSTAT) 0.4 MG SL tablet Place 1 tablet (0.4 mg total) under the tongue every 5 (five) minutes x 3 doses as needed for chest pain. 25 tablet 12   terbinafine (LAMISIL) 250 MG tablet Take 1 tablet (250 mg total) by mouth daily. 30 tablet 1   olmesartan-hydrochlorothiazide (BENICAR HCT) 40-12.5 MG tablet Take 1 tablet by mouth daily. 90 tablet 3   No current facility-administered medications on file prior to visit.    Cardiac Studies:   Carotid artery duplex Dec 26, 2014: No hemodynamically significant arterial disease in the internal carotid artery bilaterally. Mild soft plaque  noted.  Coronary angiogram 11/25/2014: S/P 4.0x22 mm Resulute DES to Mid Cx. Mid RCA 40%. Mild disease LAD. LVEF 60%.  Echocardiogram 12/01/2017: Left ventricle cavity is normal in size. Normal global wall motion. Doppler evidence of grade I (impaired) diastolic dysfunction, normal LAP. Calculated EF 63%. Trileaflet aortic valve with mild (Grade I) regurgitation. Structurally normal  mitral valve with trace regurgitation. Structurally normal tricuspid valve with mild regurgitation. No evidence of pulmonary hypertension. Compared to 02/15/2015, no significant change.  Exercise sestamibi stress test 11/27/2017: 1. The patient performed treadmill exercise using Bruce protocol, completing 10:39 minutes. The patient completed an estimated workload of 11.6 METS, reaching 83% of the maximum predicted heart rate. Normal hemodynamic response. No stress symptoms reported. Excellent exercise capacity. Patient only reached 83% of age predicted maximum heart rate in spite of achieving 11.6 METS exercise. No ischemic changes seen on stress electrocardiogram on near maximal exercise. 2. The overall quality of the study is excellent. There is no evidence of abnormal lung activity. Stress and rest SPECT images demonstrate homogeneous tracer distribution throughout the myocardium. Gated SPECT imaging reveals normal myocardial thickening and wall motion. The left ventricular ejection fraction was normal (61%). 3. Low risk, near maximal exercise nuclear stress study. Consider CTA coronary, if suspicion for obstructive CAD is high.   EKG:  EKG 12/06/2020: Sinus bradycardia at rate of 53 bpm, otherwise normal EKG.  No change from 12/07/2019.  Assessment     ICD-10-CM   1. Coronary artery disease involving native coronary artery of native heart without angina pectoris  I25.10 EKG 12-Lead    2. Essential hypertension  I10     3. Pure hypercholesterolemia  E78.00       Medications Discontinued During This Encounter   Medication Reason   Acetaminophen 500 MG capsule Error   terbinafine (LAMISIL) 250 MG tablet Error   No orders of the defined types were placed in this encounter.   Recommendations:   KABEER HOAGLAND is a 75 y.o. Caucasian male with history of prostate cancer, CAD with stenting to large mid Circumflex coronary artery with 4.0x22 mm Resolute DES in 2016. He has residual 50% mid RCA and 10% prox to mid LAD stenosis also noted with normal LVEF.  The patient presents today on annual visit for CAD. He remains asymptomatic, no chest pain or dyspnea. He exercises regularly without difficulty. His physical examination is unchanged.   His blood pressure is now well controlled and is tolerating olmesartan HCT without any side effects.  Recently treated for fungal infection of his nails, his creatinine was minimally elevated, but overall appears to be stable.  He has a complete physical scheduled with Dr. Ashby Dawes in January 2023.  As he has done well over the past few years, lipids are well controlled, I will see him back in January 2024 there is 1.5 years from now unless he has any new symptoms I will see him back sooner.  71 I want you to see   Adrian Prows, MD, [facility] 12/06/2020, 11:18 AM Office: [phone] Fax: [phone] Pager: [phone]

## 2020-12-18 ENCOUNTER — Ambulatory Visit: Payer: Medicare PPO | Admitting: Dermatology

## 2020-12-26 ENCOUNTER — Ambulatory Visit: Payer: Medicare PPO | Admitting: Dermatology

## 2020-12-26 ENCOUNTER — Encounter: Payer: Self-pay | Admitting: Dermatology

## 2020-12-26 ENCOUNTER — Other Ambulatory Visit: Payer: Self-pay

## 2020-12-26 DIAGNOSIS — B351 Tinea unguium: Secondary | ICD-10-CM | POA: Diagnosis not present

## 2020-12-26 DIAGNOSIS — D1801 Hemangioma of skin and subcutaneous tissue: Secondary | ICD-10-CM

## 2020-12-26 DIAGNOSIS — L821 Other seborrheic keratosis: Secondary | ICD-10-CM | POA: Diagnosis not present

## 2020-12-26 DIAGNOSIS — L570 Actinic keratosis: Secondary | ICD-10-CM | POA: Diagnosis not present

## 2020-12-26 DIAGNOSIS — L918 Other hypertrophic disorders of the skin: Secondary | ICD-10-CM | POA: Diagnosis not present

## 2021-01-12 ENCOUNTER — Encounter: Payer: Self-pay | Admitting: Dermatology

## 2021-01-12 NOTE — Progress Notes (Signed)
   Follow-up visit   Subjective  Ronald House is a 75 y.o. male who presents for the following: Follow-up (Right ear and post scalp lesions with scale, update on the nails they are much better ).  Check crusts, recheck nails Location:  Duration:  Quality:  Associated Signs/Symptoms: Modifying Factors:  Severity:  Timing: Context:    The following portions of the chart were reviewed this encounter and updated as appropriate:  Tobacco  Allergies  Meds  Problems  Med Hx  Surg Hx  Fam Hx      Objective  Well appearing patient in no apparent distress; mood and affect are within normal limits. Right Preauricular Area Four millimeter hornlike crust.  Right Parietal Scalp Vascular red lesion   Left Preauricular Area 4 to 6 mm light brown flattopped textured papule  Posterior Mid Neck 5 mm flesh-colored skin polyp versus polypoid nevus  Right Hand - Posterior Fingernails have improved 70%; patient very pleased and agrees to not pursue long-term preventive health to try to clear his toenails.    A focused examination was performed including waist up skin and nails. Relevant physical exam findings are noted in the Assessment and Plan.   Assessment & Plan  AK (actinic keratosis) Right Preauricular Area  Return for possible biopsy if freezing fails  Destruction of lesion - Right Preauricular Area Complexity: simple   Destruction method: cryotherapy   Informed consent: discussed and consent obtained   Timeout:  patient name, date of birth, surgical site, and procedure verified Lesion destroyed using liquid nitrogen: Yes   Cryotherapy cycles:  3 Outcome: patient tolerated procedure well with no complications   Post-procedure details: wound care instructions given    Hemangioma of skin Right Parietal Scalp  Benign okay to leave.   Seborrheic keratosis Left Preauricular Area  Benign okay to leave if stable  Skin tag Posterior Mid Neck  Leave if  stable  Tinea unguium Right Hand - Posterior  Complete 6 weeks of oral terbinafine stop therapy.

## 2021-02-02 ENCOUNTER — Other Ambulatory Visit: Payer: Self-pay | Admitting: Cardiology

## 2021-02-02 DIAGNOSIS — I10 Essential (primary) hypertension: Secondary | ICD-10-CM

## 2021-02-15 DIAGNOSIS — H353132 Nonexudative age-related macular degeneration, bilateral, intermediate dry stage: Secondary | ICD-10-CM | POA: Diagnosis not present

## 2021-02-15 DIAGNOSIS — H43813 Vitreous degeneration, bilateral: Secondary | ICD-10-CM | POA: Diagnosis not present

## 2021-02-15 DIAGNOSIS — H35372 Puckering of macula, left eye: Secondary | ICD-10-CM | POA: Diagnosis not present

## 2021-02-15 DIAGNOSIS — H25813 Combined forms of age-related cataract, bilateral: Secondary | ICD-10-CM | POA: Diagnosis not present

## 2021-02-23 DIAGNOSIS — Z20822 Contact with and (suspected) exposure to covid-19: Secondary | ICD-10-CM | POA: Diagnosis not present

## 2021-02-23 DIAGNOSIS — Z03818 Encounter for observation for suspected exposure to other biological agents ruled out: Secondary | ICD-10-CM | POA: Diagnosis not present

## 2021-02-24 DIAGNOSIS — Z20822 Contact with and (suspected) exposure to covid-19: Secondary | ICD-10-CM | POA: Diagnosis not present

## 2021-04-09 DIAGNOSIS — I129 Hypertensive chronic kidney disease with stage 1 through stage 4 chronic kidney disease, or unspecified chronic kidney disease: Secondary | ICD-10-CM | POA: Diagnosis not present

## 2021-04-09 DIAGNOSIS — E785 Hyperlipidemia, unspecified: Secondary | ICD-10-CM | POA: Diagnosis not present

## 2021-04-09 DIAGNOSIS — N182 Chronic kidney disease, stage 2 (mild): Secondary | ICD-10-CM | POA: Diagnosis not present

## 2021-04-09 DIAGNOSIS — R5383 Other fatigue: Secondary | ICD-10-CM | POA: Diagnosis not present

## 2021-04-16 DIAGNOSIS — E782 Mixed hyperlipidemia: Secondary | ICD-10-CM | POA: Diagnosis not present

## 2021-04-16 DIAGNOSIS — Z0000 Encounter for general adult medical examination without abnormal findings: Secondary | ICD-10-CM | POA: Diagnosis not present

## 2021-04-16 DIAGNOSIS — I2510 Atherosclerotic heart disease of native coronary artery without angina pectoris: Secondary | ICD-10-CM | POA: Diagnosis not present

## 2021-04-16 DIAGNOSIS — I5032 Chronic diastolic (congestive) heart failure: Secondary | ICD-10-CM | POA: Diagnosis not present

## 2021-04-16 DIAGNOSIS — I129 Hypertensive chronic kidney disease with stage 1 through stage 4 chronic kidney disease, or unspecified chronic kidney disease: Secondary | ICD-10-CM | POA: Diagnosis not present

## 2021-04-16 DIAGNOSIS — N182 Chronic kidney disease, stage 2 (mild): Secondary | ICD-10-CM | POA: Diagnosis not present

## 2021-04-18 ENCOUNTER — Other Ambulatory Visit: Payer: Self-pay | Admitting: Cardiology

## 2021-04-22 ENCOUNTER — Other Ambulatory Visit: Payer: Self-pay

## 2021-04-22 ENCOUNTER — Encounter: Payer: Self-pay | Admitting: Dermatology

## 2021-04-22 ENCOUNTER — Ambulatory Visit: Payer: Medicare PPO | Admitting: Dermatology

## 2021-04-22 DIAGNOSIS — K130 Diseases of lips: Secondary | ICD-10-CM | POA: Diagnosis not present

## 2021-04-22 MED ORDER — TRIAMCINOLONE ACETONIDE 0.1 % EX CREA: 1.0000 "application " | TOPICAL_CREAM | Freq: Two times a day (BID) | CUTANEOUS | 2 refills | Status: DC | PRN

## 2021-04-22 MED ORDER — TRIAMCINOLONE ACETONIDE 0.1 % EX OINT: 1.0000 "application " | TOPICAL_OINTMENT | Freq: Two times a day (BID) | CUTANEOUS | 3 refills | Status: DC | PRN

## 2021-04-23 ENCOUNTER — Encounter: Payer: Self-pay | Admitting: Dermatology

## 2021-04-23 NOTE — Progress Notes (Signed)
° °  Follow-Up Visit   Subjective  Ronald House is a 76 y.o. male who presents for the following: Skin Problem (Patient states he has had chap lips x months. ).  Chapped lips for several months, has used multiple over-the-counter creams and lip balm's without benefit. Location:  Duration:  Quality:  Associated Signs/Symptoms: Modifying Factors:  Severity:  Timing: Context:   Objective  Well appearing patient in no apparent distress; mood and affect are within normal limits. Mid Lower Vermilion Lip Diffuse cheilitis with no palpable lesions or erosions but moderate peeling, lower lip more than upper lip.  No angular cheilitis (historically he has had this).  No intraoral pathology.  There is actually some inflammation that extends below the vermilion border on the lower lip.  No involvement of the ears or scalp.  I suspect this is more likely an endogenous process like desquamative cheilitis or lichen planus (no involvement of wrists or ankles).  Then diffuse actinic cheilitis.    A focused examination was performed including lips, intraoral, ears, wrists.. Relevant physical exam findings are noted in the Assessment and Plan.   Assessment & Plan    Cheilitis Mid Lower Vermilion Lip  He will use triamcinolone nightly for 4 weeks; schedule follow-up at that time and we will decide whether a biopsy is indicated if there is residual.  We also discussed contact dermatitis but I think this is much less likely and no patch testing scheduled.  triamcinolone ointment (KENALOG) 0.1 % - Mid Lower Vermilion Lip Apply 1 application topically 2 (two) times daily as needed. NO CREAM      I, Lavonna Monarch, MD, have reviewed all documentation for this visit.  The documentation on 04/23/21 for the exam, diagnosis, procedures, and orders are all accurate and complete.

## 2021-04-28 ENCOUNTER — Other Ambulatory Visit: Payer: Self-pay | Admitting: Cardiology

## 2021-05-17 ENCOUNTER — Other Ambulatory Visit: Payer: Self-pay | Admitting: Cardiology

## 2021-05-17 DIAGNOSIS — I10 Essential (primary) hypertension: Secondary | ICD-10-CM

## 2021-05-22 ENCOUNTER — Encounter: Payer: Self-pay | Admitting: Dermatology

## 2021-05-22 ENCOUNTER — Ambulatory Visit: Payer: Medicare PPO | Admitting: Dermatology

## 2021-05-22 ENCOUNTER — Other Ambulatory Visit: Payer: Self-pay

## 2021-05-22 DIAGNOSIS — K130 Diseases of lips: Secondary | ICD-10-CM

## 2021-05-22 MED ORDER — TACROLIMUS 0.1 % EX OINT: TOPICAL_OINTMENT | Freq: Every day | CUTANEOUS | 1 refills | Status: DC

## 2021-06-02 ENCOUNTER — Encounter: Payer: Self-pay | Admitting: Dermatology

## 2021-06-02 NOTE — Progress Notes (Signed)
? ?  Follow-Up Visit ?  ?Subjective  ?Ronald House is a 76 y.o. male who presents for the following: Follow-up (Here for f/u for chapped lips- using triamcinolone ointment- helping a little ). ? ?Peeling lips ?Location:  ?Duration:  ?Quality:  ?Associated Signs/Symptoms: ?Modifying Factors:  ?Severity:  ?Timing: ?Context:  ? ?Objective  ?Well appearing patient in no apparent distress; mood and affect are within normal limits. ?Mid Lower Vermilion Lip, Mid Upper Vermilion Lip ?Only perhaps 30 to 40% improvement in diffuse peeling cheilitis, upper and lower lips.  Clinically this still better fits an inflammatory process like desquamative cheilitis (or less likely indirect contact dermatitis) and patient is agreeable to trying 1 more anti-inflammatory.  He understands that if this fails, biopsy to exclude significant UV damage may be the next step. ? ? ? ?A focused examination was performed including head, neck, intraoral. Relevant physical exam findings are noted in the Assessment and Plan. ? ? ?Assessment & Plan  ? ? ?Cheilitis (2) ?Mid Upper Vermilion Lip; Mid Lower Vermilion Lip ? ?Switch to topical tacrolimus nightly for 4 weeks; follow-up at that time. ? ?Related Medications ?triamcinolone ointment (KENALOG) 0.1 % ?Apply 1 application topically 2 (two) times daily as needed. NO CREAM ? ?tacrolimus (PROTOPIC) 0.1 % ointment ?Apply topically at bedtime. 30 day supply ? ? ? ? ? ?I, Lavonna Monarch, MD, have reviewed all documentation for this visit.  The documentation on 06/02/21 for the exam, diagnosis, procedures, and orders are all accurate and complete. ?

## 2021-06-20 DIAGNOSIS — M549 Dorsalgia, unspecified: Secondary | ICD-10-CM | POA: Diagnosis not present

## 2021-06-20 DIAGNOSIS — R209 Unspecified disturbances of skin sensation: Secondary | ICD-10-CM | POA: Diagnosis not present

## 2021-06-20 DIAGNOSIS — R42 Dizziness and giddiness: Secondary | ICD-10-CM | POA: Diagnosis not present

## 2021-06-26 ENCOUNTER — Telehealth: Payer: Self-pay | Admitting: Cardiology

## 2021-06-26 NOTE — Telephone Encounter (Signed)
Please give him contact information to [facility]. Anyone there is fine to see him

## 2021-06-26 NOTE — Telephone Encounter (Signed)
Patient is asking for a recommendation/referral for a neurologist. He's calling to check on any information regarding this. ?

## 2021-07-25 ENCOUNTER — Other Ambulatory Visit: Payer: Self-pay

## 2021-07-25 ENCOUNTER — Telehealth: Payer: Self-pay | Admitting: Cardiology

## 2021-07-25 MED ORDER — NITROGLYCERIN 0.4 MG SL SUBL: 0.4000 mg | SUBLINGUAL_TABLET | SUBLINGUAL | 12 refills | Status: AC | PRN

## 2021-07-25 NOTE — Telephone Encounter (Signed)
Patient requesting refill for nitroglycerin. Says he misplaced it and thinks his current one is expired anyway. ?

## 2021-07-25 NOTE — Telephone Encounter (Signed)
Done

## 2021-07-27 ENCOUNTER — Encounter (HOSPITAL_COMMUNITY): Payer: Self-pay

## 2021-07-27 ENCOUNTER — Other Ambulatory Visit: Payer: Self-pay

## 2021-07-27 ENCOUNTER — Inpatient Hospital Stay (HOSPITAL_COMMUNITY)
Admission: EM | Admit: 2021-07-27 | Discharge: 2021-07-29 | DRG: 251 | Disposition: A | Discharge status: Home / Self Care | Payer: Medicare PPO | Attending: Family Medicine | Admitting: Family Medicine

## 2021-07-27 ENCOUNTER — Emergency Department (HOSPITAL_COMMUNITY): Payer: Medicare PPO

## 2021-07-27 DIAGNOSIS — C61 Malignant neoplasm of prostate: Secondary | ICD-10-CM | POA: Diagnosis not present

## 2021-07-27 DIAGNOSIS — Z809 Family history of malignant neoplasm, unspecified: Secondary | ICD-10-CM

## 2021-07-27 DIAGNOSIS — Z955 Presence of coronary angioplasty implant and graft: Secondary | ICD-10-CM

## 2021-07-27 DIAGNOSIS — T82855A Stenosis of coronary artery stent, initial encounter: Secondary | ICD-10-CM | POA: Diagnosis not present

## 2021-07-27 DIAGNOSIS — I129 Hypertensive chronic kidney disease with stage 1 through stage 4 chronic kidney disease, or unspecified chronic kidney disease: Secondary | ICD-10-CM | POA: Diagnosis present

## 2021-07-27 DIAGNOSIS — G459 Transient cerebral ischemic attack, unspecified: Secondary | ICD-10-CM | POA: Diagnosis not present

## 2021-07-27 DIAGNOSIS — E782 Mixed hyperlipidemia: Secondary | ICD-10-CM | POA: Diagnosis present

## 2021-07-27 DIAGNOSIS — I200 Unstable angina: Principal | ICD-10-CM

## 2021-07-27 DIAGNOSIS — N179 Acute kidney failure, unspecified: Secondary | ICD-10-CM | POA: Diagnosis not present

## 2021-07-27 DIAGNOSIS — F419 Anxiety disorder, unspecified: Secondary | ICD-10-CM | POA: Diagnosis not present

## 2021-07-27 DIAGNOSIS — R7303 Prediabetes: Secondary | ICD-10-CM | POA: Diagnosis present

## 2021-07-27 DIAGNOSIS — I10 Essential (primary) hypertension: Secondary | ICD-10-CM | POA: Diagnosis not present

## 2021-07-27 DIAGNOSIS — Z8546 Personal history of malignant neoplasm of prostate: Secondary | ICD-10-CM

## 2021-07-27 DIAGNOSIS — M5136 Other intervertebral disc degeneration, lumbar region: Secondary | ICD-10-CM | POA: Diagnosis present

## 2021-07-27 DIAGNOSIS — Z20822 Contact with and (suspected) exposure to covid-19: Secondary | ICD-10-CM | POA: Diagnosis present

## 2021-07-27 DIAGNOSIS — I2510 Atherosclerotic heart disease of native coronary artery without angina pectoris: Secondary | ICD-10-CM | POA: Diagnosis present

## 2021-07-27 DIAGNOSIS — K219 Gastro-esophageal reflux disease without esophagitis: Secondary | ICD-10-CM | POA: Diagnosis present

## 2021-07-27 DIAGNOSIS — Z7982 Long term (current) use of aspirin: Secondary | ICD-10-CM | POA: Diagnosis not present

## 2021-07-27 DIAGNOSIS — Z79899 Other long term (current) drug therapy: Secondary | ICD-10-CM | POA: Diagnosis not present

## 2021-07-27 DIAGNOSIS — Y712 Prosthetic and other implants, materials and accessory cardiovascular devices associated with adverse incidents: Secondary | ICD-10-CM | POA: Diagnosis present

## 2021-07-27 DIAGNOSIS — R739 Hyperglycemia, unspecified: Secondary | ICD-10-CM | POA: Diagnosis not present

## 2021-07-27 DIAGNOSIS — E7800 Pure hypercholesterolemia, unspecified: Secondary | ICD-10-CM | POA: Diagnosis not present

## 2021-07-27 DIAGNOSIS — N1831 Chronic kidney disease, stage 3a: Secondary | ICD-10-CM | POA: Diagnosis present

## 2021-07-27 DIAGNOSIS — R079 Chest pain, unspecified: Secondary | ICD-10-CM | POA: Diagnosis not present

## 2021-07-27 DIAGNOSIS — E785 Hyperlipidemia, unspecified: Secondary | ICD-10-CM | POA: Diagnosis not present

## 2021-07-27 DIAGNOSIS — Z8673 Personal history of transient ischemic attack (TIA), and cerebral infarction without residual deficits: Secondary | ICD-10-CM | POA: Diagnosis not present

## 2021-07-27 DIAGNOSIS — I25110 Atherosclerotic heart disease of native coronary artery with unstable angina pectoris: Secondary | ICD-10-CM | POA: Diagnosis present

## 2021-07-27 DIAGNOSIS — Z8249 Family history of ischemic heart disease and other diseases of the circulatory system: Secondary | ICD-10-CM | POA: Diagnosis not present

## 2021-07-27 DIAGNOSIS — Z9861 Coronary angioplasty status: Secondary | ICD-10-CM

## 2021-07-27 LAB — CBC
HCT: 46.1 % (ref 39.0–52.0)
Hemoglobin: 15 g/dL (ref 13.0–17.0)
MCH: 31.2 pg (ref 26.0–34.0)
MCHC: 32.5 g/dL (ref 30.0–36.0)
MCV: 95.8 fL (ref 80.0–100.0)
Platelets: 207 10*3/uL (ref 150–400)
RBC: 4.81 MIL/uL (ref 4.22–5.81)
RDW: 13.1 % (ref 11.5–15.5)
WBC: 7 10*3/uL (ref 4.0–10.5)
nRBC: 0 % (ref 0.0–0.2)

## 2021-07-27 LAB — BASIC METABOLIC PANEL
Anion gap: 6 (ref 5–15)
BUN: 23 mg/dL (ref 8–23)
CO2: 29 mmol/L (ref 22–32)
Calcium: 9.7 mg/dL (ref 8.9–10.3)
Chloride: 105 mmol/L (ref 98–111)
Creatinine, Ser: 1.41 mg/dL — ABNORMAL HIGH (ref 0.61–1.24)
GFR, Estimated: 52 mL/min — ABNORMAL LOW (ref >60)
Glucose, Bld: 102 mg/dL — ABNORMAL HIGH (ref 70–99)
Potassium: 5 mmol/L (ref 3.5–5.1)
Sodium: 140 mmol/L (ref 135–145)

## 2021-07-27 LAB — RESP PANEL BY RT-PCR (FLU A&B, COVID) ARPGX2
Influenza A by PCR: NEGATIVE
Influenza B by PCR: NEGATIVE
SARS Coronavirus 2 by RT PCR: NEGATIVE

## 2021-07-27 LAB — TROPONIN I (HIGH SENSITIVITY)
Troponin I (High Sensitivity): 5 ng/L (ref <18)
Troponin I (High Sensitivity): 5 ng/L (ref <18)

## 2021-07-27 MED ORDER — NITROGLYCERIN 0.4 MG SL SUBL: 0.4000 mg | SUBLINGUAL_TABLET | SUBLINGUAL | Status: DC | PRN

## 2021-07-27 MED ORDER — ATORVASTATIN CALCIUM 80 MG PO TABS
80.0000 mg | ORAL_TABLET | Freq: Every day | ORAL | Status: DC
  Administered 2021-07-27 – 2021-07-28 (×2): 80 mg via ORAL
  Filled 2021-07-27 (×2): qty 1

## 2021-07-27 MED ORDER — OLMESARTAN MEDOXOMIL-HCTZ 40-12.5 MG PO TABS: 1.0000 | ORAL_TABLET | Freq: Every day | ORAL | Status: DC

## 2021-07-27 MED ORDER — HEPARIN BOLUS VIA INFUSION
4000.0000 [IU] | Freq: Once | INTRAVENOUS | Status: AC
  Administered 2021-07-27: 4000 [IU] via INTRAVENOUS
  Filled 2021-07-27: qty 4000

## 2021-07-27 MED ORDER — ASPIRIN EC 81 MG PO TBEC
81.0000 mg | DELAYED_RELEASE_TABLET | Freq: Every day | ORAL | Status: DC
  Administered 2021-07-28 – 2021-07-29 (×2): 81 mg via ORAL
  Filled 2021-07-27 (×2): qty 1

## 2021-07-27 MED ORDER — ONDANSETRON HCL 4 MG/2ML IJ SOLN: 4.0000 mg | Freq: Four times a day (QID) | INTRAMUSCULAR | Status: DC | PRN

## 2021-07-27 MED ORDER — ACETAMINOPHEN 325 MG PO TABS: 650.0000 mg | ORAL_TABLET | ORAL | Status: DC | PRN

## 2021-07-27 MED ORDER — HYDROCHLOROTHIAZIDE 12.5 MG PO TABS
12.5000 mg | ORAL_TABLET | Freq: Every day | ORAL | Status: DC
  Administered 2021-07-27 – 2021-07-28 (×2): 12.5 mg via ORAL
  Filled 2021-07-27 (×2): qty 1

## 2021-07-27 MED ORDER — HEPARIN (PORCINE) 25000 UT/250ML-% IV SOLN
900.0000 [IU]/h | INTRAVENOUS | Status: DC
Start: 2021-07-27 — End: 2021-07-29
  Administered 2021-07-27 – 2021-07-28 (×2): 1100 [IU]/h via INTRAVENOUS
  Filled 2021-07-27 (×2): qty 250

## 2021-07-27 MED ORDER — IRBESARTAN 150 MG PO TABS
300.0000 mg | ORAL_TABLET | Freq: Every day | ORAL | Status: DC
  Administered 2021-07-27 – 2021-07-28 (×2): 300 mg via ORAL
  Filled 2021-07-27: qty 2
  Filled 2021-07-27: qty 1
  Filled 2021-07-27: qty 2

## 2021-07-27 NOTE — Progress Notes (Signed)
ANTICOAGULATION CONSULT NOTE - Initial Consult ? ?Pharmacy Consult for heparin ?Indication: chest pain/ACS ? ?No Known Allergies ? ?Patient Measurements: ?Height: 5' 10.5" (179.1 cm) ?Weight: 78.5 kg (173 lb) ?IBW/kg (Calculated) : 74.15 ?Heparin Dosing Weight: 78 kg  ? ?Vital Signs: ?Temp: 97.6 ?F (36.4 ?C) (05/13 1137) ?BP: 141/79 (05/13 1600) ?Pulse Rate: 48 (05/13 1600) ? ?Labs: ?Recent Labs  ?  07/27/21 ?1235  ?HGB 15.0  ?HCT 46.1  ?PLT 207  ?CREATININE 1.41*  ?TROPONINIHS 5  ? ? ?Estimated Creatinine Clearance: 47.5 mL/min (A) (by C-G formula based on SCr of 1.41 mg/dL (H)). ? ? ?Medical History: ?Past Medical History:  ?Diagnosis Date  ? Anxiety   ? Arteriosclerosis of both carotid arteries 06/02/2018  ? Arthritis   ? "lower back" (11/24/2014)  ? Bulging lumbar disc   ? Chronic lower back pain   ? Coronary artery disease   ? DDD (degenerative disc disease), lumbar   ? Dyspnea on exertion 06/02/2018  ? GERD (gastroesophageal reflux disease)   ? Hematuria   ? Peyronie disease   ? Prostate cancer ([place])   ? Undiagnosed cardiac murmurs   ? childhood/unknown if present issue  ? ? ?Medications:  ?(Not in a hospital admission)  ? ?Assessment: ?71 YOM with shortness of breath and chest tightness to start IV heparin for ACS. H/H and Plt wnl. SCr elevated  ? ?Goal of Therapy:  ?Heparin level 0.3-0.7 units/ml ?Monitor platelets by anticoagulation protocol: Yes ?  ?Plan:  ?-Heparin 4000 units IV bolus followed by heparin infusion at 1000 units/hr ?-F/u 8 hr HL ?-Monitor daily HL, CBC, and s/s of bleeding  ? ?Albertina Parr, PharmD., BCCCP ?Clinical Pharmacist ?Please refer to [facility] for unit-specific pharmacist  ? ? ?

## 2021-07-27 NOTE — ED Provider Triage Note (Signed)
Emergency Medicine Provider Triage Evaluation Note ? ?Ronald House , a 75 y.o. male  was evaluated in triage.  Pt complains of chest pain. States that over the past week he has been feeling a heaviness in the substernal region of his chest that is worse with exertion and does not radiate.  Also endorses some associated shortness of breath.  Patient states that he is very active and walks 2 miles a day.  States he is still been able to do so but is having difficulty due to chest pain and shortness of breath.  Also endorses some nonspecific numbness and tingling in his lower extremities that comes and goes.  States that is not present currently.  History of stent placement in 2016 followed by cardiologist Dr. Einar Gip.  Denies any history of feeling similarly in the past. ? ?Review of Systems  ?Positive:  ?Negative: See above ? ?Physical Exam  ?BP (!) 155/77   Pulse (!) 57   Temp 97.6 ?F (36.4 ?C)   Resp 16   Ht 5' 10.5" (1.791 m)   Wt 78.5 kg   SpO2 98%   BMI 24.47 kg/m?  ?Gen:   Awake, no distress   ?Resp:  Normal effort  ?MSK:   Moves extremities without difficulty  ?Other:   ? ?Medical Decision Making  ?Medically screening exam initiated at 12:34 PM.  Appropriate orders placed.  Ronald House was informed that the remainder of the evaluation will be completed by another provider, this initial triage assessment does not replace that evaluation, and the importance of remaining in the ED until their evaluation is complete. ? ? ?  ?Bud Face, PA-C ?07/27/21 1237 ? ?

## 2021-07-27 NOTE — ED Provider Notes (Signed)
?[facility] ?Provider Note ? ? ?CSN: [id number] ?Arrival date & time: 07/27/21  1133 ? ?  ? ?History ? ?Chief Complaint  ?Patient presents with  ? Chest Pain  ? ? ?Ronald House is a 76 y.o. male hx of HTN, high cholesterol, previous cardiac stent here with SOB with exertion.  Patient states that he was doing well until about a week or 2 ago.  He states that he walks 2 miles a day.  He states that for the last week or so he noticed that he gets very short of breath and chest tightness when he walks more than 1 mile.  He had some dizziness as well.  Patient had left circumflex stent placed by Dr. Einar Gip in 2016.  During the catheterization, he also has 50% RCA stenosis but no stent was placed at that time.  Patient denies any chest pain at rest ? ?The history is provided by the patient.  ? ?  ? ?Home Medications ?Prior to Admission medications   ?Medication Sig Start Date End Date Taking? Authorizing Provider  ?aspirin EC 81 MG EC tablet Take 1 tablet (81 mg total) by mouth daily. 01/14/13   Hongalgi, Lenis Dickinson, MD  ?atorvastatin (LIPITOR) 80 MG tablet TAKE 1 TABLET(80 MG) BY MOUTH DAILY AT 6 PM 04/30/21   Adrian Prows, MD  ?carvedilol (COREG) 3.125 MG tablet TAKE 1 TABLET(3.125 MG) BY MOUTH TWICE DAILY WITH A MEAL 04/18/21   Adrian Prows, MD  ?cholecalciferol (VITAMIN D3) 25 MCG (1000 UNIT) tablet Take 1,000 Units by mouth daily.    [provider]  ?Multiple Vitamin (MULTIVITAMIN) tablet Take 1 tablet by mouth daily.    [provider]  ?nitroGLYCERIN (NITROSTAT) 0.4 MG SL tablet Place 1 tablet (0.4 mg total) under the tongue every 5 (five) minutes x 3 doses as needed for chest pain. 07/25/21   Adrian Prows, MD  ?olmesartan-hydrochlorothiazide (BENICAR HCT) 40-12.5 MG tablet TAKE 1 TABLET BY MOUTH DAILY 05/17/21   Adrian Prows, MD  ?tacrolimus (PROTOPIC) 0.1 % ointment Apply topically at bedtime. 30 day supply 05/22/21   Lavonna Monarch, MD  ?terbinafine (LAMISIL) 250 MG tablet  Take 1 tablet (250 mg total) by mouth daily. ?Patient not taking: Reported on 05/22/2021 07/09/20   Lavonna Monarch, MD  ?triamcinolone ointment (KENALOG) 0.1 % Apply 1 application topically 2 (two) times daily as needed. NO CREAM 04/22/21   Lavonna Monarch, MD  ?   ? ?Allergies    ?Patient has no known allergies.   ? ?Review of Systems   ?Review of Systems  ?Cardiovascular:  Positive for chest pain.  ?All other systems reviewed and are negative. ? ?Physical Exam ?Updated Vital Signs ?BP (!) 141/79   Pulse (!) 48   Temp 97.6 ?F (36.4 ?C)   Resp 17   Ht 5' 10.5" (1.791 m)   Wt 78.5 kg   SpO2 97%   BMI 24.47 kg/m?  ?Physical Exam ?Vitals and nursing note reviewed.  ?Constitutional:   ?   Appearance: He is well-developed.  ?HENT:  ?   Head: Normocephalic.  ?Eyes:  ?   Extraocular Movements: Extraocular movements intact.  ?   Pupils: Pupils are equal, round, and reactive to light.  ?Cardiovascular:  ?   Rate and Rhythm: Normal rate and regular rhythm.  ?   Heart sounds: Normal heart sounds.  ?Pulmonary:  ?   Effort: Pulmonary effort is normal.  ?   Breath sounds: Normal breath sounds.  ?Musculoskeletal:     ?  General: Normal range of motion.  ?   Cervical back: Normal range of motion and neck supple.  ?Skin: ?   General: Skin is warm.  ?   Capillary Refill: Capillary refill takes less than 2 seconds.  ?Neurological:  ?   General: No focal deficit present.  ?   Mental Status: He is alert and oriented to person, place, and time.  ?Psychiatric:     ?   Mood and Affect: Mood normal.     ?   Behavior: Behavior normal.  ? ? ?ED Results / Procedures / Treatments   ?Labs ?(all labs ordered are listed, but only abnormal results are displayed) ?Labs Reviewed  ?BASIC METABOLIC PANEL - Abnormal; Notable for the following components:  ?    Result Value  ? Glucose, Bld 102 (*)   ? Creatinine, Ser 1.41 (*)   ? GFR, Estimated 52 (*)   ? All other components within normal limits  ?CBC  ?TROPONIN I (HIGH SENSITIVITY)  ?TROPONIN I (HIGH  SENSITIVITY)  ? ? ?EKG ?EKG Interpretation ? ?Date/Time:  Saturday Jul 27 2021 11:45:41 EDT ?Ventricular Rate:  59 ?PR Interval:  144 ?QRS Duration: 84 ?QT Interval:  416 ?QTC Calculation: 411 ?R Axis:   57 ?Text Interpretation: Sinus bradycardia Otherwise normal ECG When compared with ECG of 25-Dec-2018 08:53, PREVIOUS ECG IS PRESENT Confirmed by Wandra Arthurs [phone]) on 07/27/2021 3:01:42 PM ? ?Radiology ?DG Chest 2 View ? ?Result Date: 07/27/2021 ?CLINICAL DATA:  chest pain EXAM: CHEST - 2 VIEW COMPARISON:  Chest radiograph dated November 24, 2014 FINDINGS: The cardiomediastinal silhouette is unchanged and normal in contour.Atherosclerotic calcifications. No pleural effusion. No pneumothorax. Calcified granuloma of the LEFT apex. No acute pleuroparenchymal abnormality. Visualized abdomen is unremarkable. Mild degenerative changes of the thoracic spine. IMPRESSION: No acute cardiopulmonary abnormality. Electronically Signed   By: Valentino Saxon M.D.   On: 07/27/2021 13:10   ? ?Procedures ?Procedures  ? ? ?Medications Ordered in ED ?Medications - No data to display ? ?ED Course/ Medical Decision Making/ A&P ?  ?                        ?Medical Decision Making ?Ronald House is a 76 y.o. male here presenting with chest pain with exertion.  I am concerned for possible unstable angina given exertional symptoms.  Patient does not have any chest pain at rest.  Plan to get CBC and BMP and troponin and chest x-ray ? ?4:14 PM' ?Initial troponin is negative.  I consulted Dr. Terri Skains from cardiology.  He states that patient likely will need catheterization and admission for unstable angina.  He recommends medicine admission and he will consult. ? ? ?Problems Addressed: ?Unstable angina ([place]): acute illness or injury ? ?Amount and/or Complexity of Data Reviewed ?Labs: ordered. Decision-making details documented in ED Course. ?Radiology: ordered and independent interpretation performed. Decision-making details documented in ED  Course. ?ECG/medicine tests: ordered and independent interpretation performed. Decision-making details documented in ED Course. ? ?Risk ?Decision regarding hospitalization. ? ? ?Final Clinical Impression(s) / ED Diagnoses ?Final diagnoses:  ?None  ? ? ?Rx / DC Orders ?ED Discharge Orders   ? ? None  ? ?  ? ? ?  ?Drenda Freeze, MD ?07/27/21 1624 ? ?

## 2021-07-27 NOTE — ED Triage Notes (Signed)
Pt arrived POV from home c/o centrallized tightness in his chest the last couple of days during his evening walks. Pt also endorses dizziness and dry mouth during these episodes.  ?

## 2021-07-27 NOTE — H&P (Signed)
?History and Physical  ? ? ?Ronald House GMW:[id number] DOB: 1946-02-08 DOA: 07/27/2021 ? ?PCP: [name], MD  ?Patient coming from: Home ? ?I have personally briefly reviewed patient's old medical records in [place] ? ?Chief Complaint: Chest tightness and exertional shortness of breath since 3 days. ? ?HPI: Ronald House is a 76 y.o. male with medical history significant of hypertension, prostate cancer, chronic low back pain, coronary artery disease with a stent to large mid circumflex coronary artery in 2016 presented with chest tightness and exertional shortness of breath since 3 days ? ?Patient reports that he noted chest tightness along with shortness of breath with exertion since 3 days.  He reports that he walks 2 miles per day and since last couple of days he noticed short of breath easily with mild exertion.  He also noted some dizziness and numbness tingling sensation in his feet.  He does have a history of coronary artery disease status post stent placement with Dr. Einar Gip in 2016.  Due to his cardiac history he decided to come to the ER for further evaluation and management. ? ?He reports being compliant with his medication.  No history of tobacco abuse, illicit drug use however drinks a glass of wine every night. ? ?ED Course: Upon arrival to ED: Patient afebrile, heart rate: 57, respiratory rate: 16, blood pressure 155/77, maintaining oxygen saturation on room air.  CBC shows no leukocytosis, H&H: 15.0/46.1, platelet: 207, sodium 140, potassium 5.0, BUN 23, creatinine: 1.41, GFR 52.  Initial troponin negative.  Chest x-ray negative for any acute finding.  ED physician consulted cardiology who recommended that patient needs admission for cardiac cath.  [facility] hospitalist consulted for admission for chest pain rule out ACS. ? ?Review of Systems: As per HPI otherwise negative.  ? ? ?Past Medical History:  ?Diagnosis Date  ? Anxiety   ? Arteriosclerosis of both carotid arteries 06/02/2018   ? Arthritis   ? "lower back" (11/24/2014)  ? Bulging lumbar disc   ? Chronic lower back pain   ? Coronary artery disease   ? DDD (degenerative disc disease), lumbar   ? Dyspnea on exertion 06/02/2018  ? GERD (gastroesophageal reflux disease)   ? Hematuria   ? Peyronie disease   ? Prostate cancer ([place])   ? Undiagnosed cardiac murmurs   ? childhood/unknown if present issue  ? ? ?Past Surgical History:  ?Procedure Laterality Date  ? CARDIAC CATHETERIZATION N/A 11/24/2014  ? Procedure: Left Heart Cath and Coronary Angiography;  Surgeon: Adrian Prows, MD;  Location: [facility];  Service: Cardiovascular;  Laterality: N/A;  ? CARDIAC CATHETERIZATION N/A 11/24/2014  ? Procedure: Coronary Stent Intervention;  Surgeon: Adrian Prows, MD;  Location: [facility];  Service: Cardiovascular;  Laterality: N/A;  ? CORONARY ANGIOPLASTY WITH STENT PLACEMENT  11/24/2014  ? "1 stent"  ? FINGER FRACTURE SURGERY Right 1980's  ? index finger  ? FRACTURE SURGERY    ? OPEN TREATMENT ZYGOMATIC ARCH FRACTURE  ~ 1980  ? PROSTATE BIOPSY  <05/2011  ? RADIOACTIVE SEED IMPLANT  05/22/2011  ? Procedure: RADIOACTIVE SEED IMPLANT;  Surgeon: Ailene Rud, MD;  Location: [facility];  Service: Urology;  Laterality: N/A;  RAD TECH OK PER [facility]   ? TONSILLECTOMY AND ADENOIDECTOMY  1950's  ? ? ? reports that he has never smoked. He has never used smokeless tobacco. He reports current alcohol use of about 5.0 standard drinks per week. He reports that  he does not use drugs. ? ?No Known Allergies ? ?Family History  ?Problem Relation Age of Onset  ? Benign prostatic hyperplasia Father   ? Cancer Father   ?     throat  ? Heart disease Brother   ? Colon cancer Neg Hx   ? ? ?Prior to Admission medications   ?Medication Sig Start Date End Date Taking? Authorizing Provider  ?aspirin EC 81 MG EC tablet Take 1 tablet (81 mg total) by mouth daily. 01/14/13   Hongalgi, Lenis Dickinson, MD  ?atorvastatin (LIPITOR) 80 MG tablet TAKE 1 TABLET(80 MG)  BY MOUTH DAILY AT 6 PM 04/30/21   Adrian Prows, MD  ?carvedilol (COREG) 3.125 MG tablet TAKE 1 TABLET(3.125 MG) BY MOUTH TWICE DAILY WITH A MEAL 04/18/21   Adrian Prows, MD  ?cholecalciferol (VITAMIN D3) 25 MCG (1000 UNIT) tablet Take 1,000 Units by mouth daily.    [provider]  ?Multiple Vitamin (MULTIVITAMIN) tablet Take 1 tablet by mouth daily.    [provider]  ?nitroGLYCERIN (NITROSTAT) 0.4 MG SL tablet Place 1 tablet (0.4 mg total) under the tongue every 5 (five) minutes x 3 doses as needed for chest pain. 07/25/21   Adrian Prows, MD  ?olmesartan-hydrochlorothiazide (BENICAR HCT) 40-12.5 MG tablet TAKE 1 TABLET BY MOUTH DAILY 05/17/21   Adrian Prows, MD  ?tacrolimus (PROTOPIC) 0.1 % ointment Apply topically at bedtime. 30 day supply 05/22/21   Lavonna Monarch, MD  ?terbinafine (LAMISIL) 250 MG tablet Take 1 tablet (250 mg total) by mouth daily. ?Patient not taking: Reported on 05/22/2021 07/09/20   Lavonna Monarch, MD  ?triamcinolone ointment (KENALOG) 0.1 % Apply 1 application topically 2 (two) times daily as needed. NO CREAM 04/22/21   Lavonna Monarch, MD  ? ? ?Physical Exam: ?Vitals:  ? 07/27/21 1528 07/27/21 1530 07/27/21 1545 07/27/21 1600  ?BP: (!) 143/77 (!) 143/72 135/76 (!) 141/79  ?Pulse: (!) 58 (!) 49 (!) 49 (!) 48  ?Resp: 16   17  ?Temp:      ?SpO2: 98% 100% 98% 97%  ?Weight:      ?Height:      ? ? ?Constitutional: NAD, calm, comfortable, on room air, communicating well ?Eyes: PERRL, lids and conjunctivae normal ?ENMT: Mucous membranes are moist. Posterior pharynx clear of any exudate or lesions.Normal dentition.  ?Neck: normal, supple, no masses, no thyromegaly ?Respiratory: clear to auscultation bilaterally, no wheezing, no crackles. Normal respiratory effort. No accessory muscle use.  ?Cardiovascular: Regular rate and rhythm, no murmurs / rubs / gallops. No extremity edema. 2+ pedal pulses. No carotid bruits.  ?Abdomen: no tenderness, no masses palpated. No hepatosplenomegaly. Bowel sounds  positive.  ?Musculoskeletal: no clubbing / cyanosis. No joint deformity upper and lower extremities. Good ROM, no contractures. Normal muscle tone.  ?Skin: no rashes, lesions, ulcers. No induration ?Neurologic: CN 2-12 grossly intact. Sensation intact, DTR normal. Strength 5/5 in all 4.  ?Psychiatric: Normal judgment and insight. Alert and oriented x 3. Normal mood.  ? ? ?Labs on Admission: I have personally reviewed following labs and imaging studies ? ?CBC: ?Recent Labs  ?Lab 07/27/21 ?1235  ?WBC 7.0  ?HGB 15.0  ?HCT 46.1  ?MCV 95.8  ?PLT 207  ? ?Basic Metabolic Panel: ?Recent Labs  ?Lab 07/27/21 ?1235  ?NA 140  ?K 5.0  ?CL 105  ?CO2 29  ?GLUCOSE 102*  ?BUN 23  ?CREATININE 1.41*  ?CALCIUM 9.7  ? ?GFR: ?Estimated Creatinine Clearance: 47.5 mL/min (A) (by C-G formula based on SCr of 1.41 mg/dL (H)). ?  Liver Function Tests: ?No results for input(s): AST, ALT, ALKPHOS, BILITOT, PROT, ALBUMIN in the last 168 hours. ?No results for input(s): LIPASE, AMYLASE in the last 168 hours. ?No results for input(s): AMMONIA in the last 168 hours. ?Coagulation Profile: ?No results for input(s): INR, PROTIME in the last 168 hours. ?Cardiac Enzymes: ?No results for input(s): CKTOTAL, CKMB, CKMBINDEX, TROPONINI in the last 168 hours. ?BNP (last 3 results) ?No results for input(s): PROBNP in the last 8760 hours. ?HbA1C: ?No results for input(s): HGBA1C in the last 72 hours. ?CBG: ?No results for input(s): GLUCAP in the last 168 hours. ?Lipid Profile: ?No results for input(s): CHOL, HDL, LDLCALC, TRIG, CHOLHDL, LDLDIRECT in the last 72 hours. ?Thyroid Function Tests: ?No results for input(s): TSH, T4TOTAL, FREET4, T3FREE, THYROIDAB in the last 72 hours. ?Anemia Panel: ?No results for input(s): VITAMINB12, FOLATE, FERRITIN, TIBC, IRON, RETICCTPCT in the last 72 hours. ?Urine analysis: ?No results found for: COLORURINE, APPEARANCEUR, [place], [place], [place], [place], [place], KETONESUR, PROTEINUR, [place], NITRITE,  LEUKOCYTESUR ? ?Radiological Exams on Admission: ?DG Chest 2 View ? ?Result Date: 07/27/2021 ?CLINICAL DATA:  chest pain EXAM: CHEST - 2 VIEW COMPARISON:  Chest radiograph dated November 24, 2014 FINDINGS: The cardiomediast

## 2021-07-28 ENCOUNTER — Inpatient Hospital Stay (HOSPITAL_COMMUNITY): Payer: Medicare PPO

## 2021-07-28 DIAGNOSIS — I25110 Atherosclerotic heart disease of native coronary artery with unstable angina pectoris: Secondary | ICD-10-CM | POA: Diagnosis not present

## 2021-07-28 DIAGNOSIS — C61 Malignant neoplasm of prostate: Secondary | ICD-10-CM

## 2021-07-28 DIAGNOSIS — E782 Mixed hyperlipidemia: Secondary | ICD-10-CM | POA: Diagnosis not present

## 2021-07-28 DIAGNOSIS — I10 Essential (primary) hypertension: Secondary | ICD-10-CM | POA: Diagnosis not present

## 2021-07-28 DIAGNOSIS — R079 Chest pain, unspecified: Secondary | ICD-10-CM | POA: Diagnosis not present

## 2021-07-28 DIAGNOSIS — G459 Transient cerebral ischemic attack, unspecified: Secondary | ICD-10-CM

## 2021-07-28 DIAGNOSIS — I200 Unstable angina: Secondary | ICD-10-CM | POA: Diagnosis not present

## 2021-07-28 LAB — CBC
HCT: 39.6 % (ref 39.0–52.0)
HCT: 41.6 % (ref 39.0–52.0)
Hemoglobin: 12.7 g/dL — ABNORMAL LOW (ref 13.0–17.0)
Hemoglobin: 13.4 g/dL (ref 13.0–17.0)
MCH: 30.6 pg (ref 26.0–34.0)
MCH: 30.8 pg (ref 26.0–34.0)
MCHC: 32.1 g/dL (ref 30.0–36.0)
MCHC: 32.2 g/dL (ref 30.0–36.0)
MCV: 95.4 fL (ref 80.0–100.0)
MCV: 95.6 fL (ref 80.0–100.0)
Platelets: 178 10*3/uL (ref 150–400)
Platelets: 187 10*3/uL (ref 150–400)
RBC: 4.15 MIL/uL — ABNORMAL LOW (ref 4.22–5.81)
RBC: 4.35 MIL/uL (ref 4.22–5.81)
RDW: 13.2 % (ref 11.5–15.5)
RDW: 13.1 % (ref 11.5–15.5)
WBC: 7.5 10*3/uL (ref 4.0–10.5)
WBC: 6.6 10*3/uL (ref 4.0–10.5)
nRBC: 0 % (ref 0.0–0.2)
nRBC: 0 % (ref 0.0–0.2)

## 2021-07-28 LAB — ECHOCARDIOGRAM COMPLETE
AR max vel: 2.38 cm2
AV Area VTI: 2.31 cm2
AV Area mean vel: 2.73 cm2
AV Mean grad: 7 mmHg
AV Peak grad: 16.6 mmHg
Ao pk vel: 2.04 m/s
Area-P 1/2: 2.13 cm2
Height: 70.5 in
S' Lateral: 2.6 cm
Weight: 2768 oz

## 2021-07-28 LAB — HEPARIN LEVEL (UNFRACTIONATED)
Heparin Unfractionated: 0.65 IU/mL (ref 0.30–0.70)
Heparin Unfractionated: 0.75 IU/mL — ABNORMAL HIGH (ref 0.30–0.70)
Heparin Unfractionated: 0.69 IU/mL (ref 0.30–0.70)

## 2021-07-28 LAB — BASIC METABOLIC PANEL
Anion gap: 6 (ref 5–15)
BUN: 25 mg/dL — ABNORMAL HIGH (ref 8–23)
CO2: 28 mmol/L (ref 22–32)
Calcium: 8.7 mg/dL — ABNORMAL LOW (ref 8.9–10.3)
Chloride: 105 mmol/L (ref 98–111)
Creatinine, Ser: 1.22 mg/dL (ref 0.61–1.24)
GFR, Estimated: >60 mL/min (ref >60)
Glucose, Bld: 104 mg/dL — ABNORMAL HIGH (ref 70–99)
Potassium: 3.9 mmol/L (ref 3.5–5.1)
Sodium: 139 mmol/L (ref 135–145)

## 2021-07-28 LAB — HEMOGLOBIN A1C
Hgb A1c MFr Bld: 6 % — ABNORMAL HIGH (ref 4.8–5.6)
Mean Plasma Glucose: 125.5 mg/dL

## 2021-07-28 LAB — TSH: TSH: 1.212 u[IU]/mL (ref 0.350–4.500)

## 2021-07-28 MED ORDER — SODIUM CHLORIDE 0.9% FLUSH: 3.0000 mL | INTRAVENOUS | Status: DC | PRN

## 2021-07-28 MED ORDER — SODIUM CHLORIDE 0.9% FLUSH: 3.0000 mL | Freq: Two times a day (BID) | INTRAVENOUS | Status: DC

## 2021-07-28 MED ORDER — SODIUM CHLORIDE 0.9 % IV SOLN: 250.0000 mL | INTRAVENOUS | Status: DC | PRN

## 2021-07-28 MED ORDER — SODIUM CHLORIDE 0.9 % IV SOLN: INTRAVENOUS | Status: DC

## 2021-07-28 NOTE — H&P (View-Only) (Signed)
CARDIOLOGY CONSULT NOTE  ?Patient ID: ?Ronald House ?MRN: [id number] ?DOB/AGE: 04-11-1945 76 y.o. ? ?Admit date: 07/27/2021 ?Attending physician: Patrecia Pour, MD ?Primary Physician:  [name], MD ?Outpatient Cardiologist: Dr. Adrian Prows  ?Inpatient Cardiologist: Rex Kras, DO, [facility] ? ?Reason of consultation: Chest Pain  ?Referring physician: Dr. Darl Householder ? ?Chief complaint: chest pain.  ? ?HPI:  ?Ronald House is a 76 y.o. Caucasian male who presents with a chief complaint of " chest pain." His past medical history and cardiovascular risk factors include: History of prostate cancer, CAD with stenting to the large size mid LCx with 4 x 22 mm resolute DES 2016, multivessel CAD, hypertension, HLD, advance age. ? ?Patient presents to the hospital with concerns for precordial discomfort with exertion.  He has known history of CAD and since then has been managed medically and tries to walk at least 2 miles regularly.  However recently he started noticing chest tightness, substernally located, with walking or stressful situations, nonradiating, intensity 2 out of 10, improves with resting.  Patient states the symptoms are less intense compared to his unstable angina event in 2016 but similar. ? ?Due to the symptoms and nonspecific heaviness in bilateral lower extremities patient presents to the hospital for further evaluation and management. ? ?He is currently accompanied by his wife Ronald House at bedside. ? ?Denies any active chest pain or heart failure symptoms. ? ?ALLERGIES: ?No Known Allergies ? ?PAST MEDICAL HISTORY: ?Past Medical History:  ?Diagnosis Date  ? Anxiety   ? Arteriosclerosis of both carotid arteries 06/02/2018  ? Arthritis   ? "lower back" (11/24/2014)  ? Bulging lumbar disc   ? Chronic lower back pain   ? Coronary artery disease   ? DDD (degenerative disc disease), lumbar   ? Dyspnea on exertion 06/02/2018  ? GERD (gastroesophageal reflux disease)   ? Hematuria   ? Peyronie disease   ? Prostate cancer ([place])    ? Undiagnosed cardiac murmurs   ? childhood/unknown if present issue  ? ? ?PAST SURGICAL HISTORY: ?Past Surgical History:  ?Procedure Laterality Date  ? CARDIAC CATHETERIZATION N/A 11/24/2014  ? Procedure: Left Heart Cath and Coronary Angiography;  Surgeon: Adrian Prows, MD;  Location: [facility];  Service: Cardiovascular;  Laterality: N/A;  ? CARDIAC CATHETERIZATION N/A 11/24/2014  ? Procedure: Coronary Stent Intervention;  Surgeon: Adrian Prows, MD;  Location: [facility];  Service: Cardiovascular;  Laterality: N/A;  ? CORONARY ANGIOPLASTY WITH STENT PLACEMENT  11/24/2014  ? "1 stent"  ? FINGER FRACTURE SURGERY Right 1980's  ? index finger  ? FRACTURE SURGERY    ? OPEN TREATMENT ZYGOMATIC ARCH FRACTURE  ~ 1980  ? PROSTATE BIOPSY  <05/2011  ? RADIOACTIVE SEED IMPLANT  05/22/2011  ? Procedure: RADIOACTIVE SEED IMPLANT;  Surgeon: Ailene Rud, MD;  Location[facility];  Service: Urology;  Laterality: N/A;  RAD TECH OK PER [facility]   ? TONSILLECTOMY AND ADENOIDECTOMY  1950's  ? ? ?FAMILY HISTORY: ?The patient's family history includes Benign prostatic hyperplasia in his father; Cancer in his father; Heart disease in his brother. ?  ?SOCIAL HISTORY:  ?The patient  reports that he has never smoked. He has never used smokeless tobacco. He reports current alcohol use of about 5.0 standard drinks per week. He reports that he does not use drugs. ? ?MEDICATIONS: ?Current Outpatient Medications  ?Medication Instructions  ? aspirin 81 mg, Oral, Daily  ? atorvastatin (LIPITOR) 80 MG tablet TAKE 1 TABLET(80  MG) BY MOUTH DAILY AT 6 PM  ? carvedilol (COREG) 3.125 MG tablet TAKE 1 TABLET(3.125 MG) BY MOUTH TWICE DAILY WITH A MEAL  ? cholecalciferol (VITAMIN D3) 1,000 Units, Oral, Daily  ? lidocaine 4 % 1 patch, Transdermal, Weekly  ? Multiple Vitamin (MULTIVITAMIN) tablet 1 tablet, Oral, Daily  ? nitroGLYCERIN (NITROSTAT) 0.4 mg, Sublingual, Every 5 min x3 PRN  ? olmesartan-hydrochlorothiazide  (BENICAR HCT) 40-12.5 MG tablet 1 tablet, Oral, Daily  ? Propylene Glycol (SYSTANE COMPLETE OP) 1 drop, Both Eyes, Daily PRN  ? tacrolimus (PROTOPIC) 0.1 % ointment Topical, Daily at bedtime, 30 day supply  ? terbinafine (LAMISIL) 250 mg, Oral, Daily  ? triamcinolone ointment (KENALOG) 0.1 % 1 application., Topical, 2 times daily PRN, NO CREAM  ? ? ?REVIEW OF SYSTEMS: ?Review of Systems  ?Cardiovascular:  Positive for chest pain and dyspnea on exertion. Negative for claudication, leg swelling, near-syncope, orthopnea, palpitations, paroxysmal nocturnal dyspnea and syncope.  ?Respiratory:  Positive for wheezing. Negative for cough and shortness of breath.   ?Neurological:  Positive for dizziness, light-headedness and vertigo.  ?All other systems reviewed and are negative. ? ?PHYSICAL EXAM: ? ?  07/28/2021  ?  9:14 AM 07/28/2021  ?  5:27 AM 07/27/2021  ?  8:23 PM  ?Vitals with BMI  ?Systolic 354 656 812  ?Diastolic 53 69 74  ?Pulse 55 45 54  ? ? ? ?Intake/Output Summary (Last 24 hours) at 07/28/2021 1250 ?Last data filed at 07/28/2021 0900 ?Gross per 24 hour  ?Intake 756.51 ml  ?Output --  ?Net 756.51 ml  ?  ?Net IO Since Admission: 756.51 mL [07/28/21 1250] ? ?CONSTITUTIONAL: Well-developed and well-nourished. No acute distress.  ?SKIN: Skin is warm and dry. No rash noted. No cyanosis. No pallor. No jaundice ?HEAD: Normocephalic and atraumatic.  ?EYES: No scleral icterus ?MOUTH/THROAT: Moist oral membranes.  ?NECK: No JVD present. No thyromegaly noted. No carotid bruits  ?LYMPHATIC: No visible cervical adenopathy.  ?CHEST Normal respiratory effort. No intercostal retractions  ?LUNGS: Clear to auscultation bilaterally.  No stridor. No wheezes. No rales.  ?CARDIOVASCULAR: Regular rate and rhythm, positive S1-S2, no murmurs rubs or gallops appreciated ?ABDOMINAL: Soft, nontender, nondistended, positive bowel sounds in all 4 quadrants, no apparent ascites.  ?EXTREMITIES: No pitting edema, warm to touch.  ?HEMATOLOGIC: No  significant bruising ?NEUROLOGIC: Oriented to person, place, and time. Nonfocal. Normal muscle tone.  ?PSYCHIATRIC: Normal mood and affect. Normal behavior. Cooperative ? ?RADIOLOGY: ?DG Chest 2 View ? ?Result Date: 07/27/2021 ?CLINICAL DATA:  chest pain EXAM: CHEST - 2 VIEW COMPARISON:  Chest radiograph dated November 24, 2014 FINDINGS: The cardiomediastinal silhouette is unchanged and normal in contour.Atherosclerotic calcifications. No pleural effusion. No pneumothorax. Calcified granuloma of the LEFT apex. No acute pleuroparenchymal abnormality. Visualized abdomen is unremarkable. Mild degenerative changes of the thoracic spine. IMPRESSION: No acute cardiopulmonary abnormality. Electronically Signed   By: Valentino Saxon M.D.   On: 07/27/2021 13:10   ? ?LABORATORY DATA: ?Lab Results  ?Component Value Date  ? WBC 6.6 07/28/2021  ? HGB 13.4 07/28/2021  ? HCT 41.6 07/28/2021  ? MCV 95.6 07/28/2021  ? PLT 187 07/28/2021  ?  ?Recent Labs  ?Lab 07/28/21 ?0118  ?NA 139  ?K 3.9  ?CL 105  ?CO2 28  ?BUN 25*  ?CREATININE 1.22  ?CALCIUM 8.7*  ?GLUCOSE 104*  ? ? ?Lipid Panel  ?Lab Results  ?Component Value Date  ? CHOL 171 01/14/2013  ? HDL 59 01/14/2013  ? [place] 98 01/14/2013  ? TRIG  70 01/14/2013  ? CHOLHDL 2.9 01/14/2013  ? ? ?BNP (last 3 results) ?No results for input(s): BNP in the last 8760 hours. ? ?HEMOGLOBIN A1C ?Lab Results  ?Component Value Date  ? HGBA1C 5.7 (H) 11/25/2014  ? MPG 117 11/25/2014  ? ? ?Cardiac Panel (last 3 results) ?Recent Labs  ?  07/27/21 ?1235 07/27/21 ?1435  ?TROPONINIHS 5 5  ? ? ? ?TSH ?Recent Labs  ?  07/28/21 ?0118  ?TSH 1.212  ?  ? ?CARDIAC DATABASE: ?EKG: ?07/27/2021: Sinus bradycardia, 59 bpm, without underlying ischemia injury pattern. ? ?Echocardiogram: ?12/01/2017: Left ventricle cavity is normal in size. Normal global wall motion. Doppler evidence of grade I (impaired) diastolic dysfunction, normal LAP. Calculated EF 63%. Trileaflet aortic valve with mild (Grade I) regurgitation.  Structurally normal mitral valve with trace regurgitation. Structurally normal tricuspid valve with mild regurgitation. No evidence of pulmonary hypertension. Compared to 02/15/2015, no significant change. ?

## 2021-07-28 NOTE — Consult Note (Addendum)
CARDIOLOGY CONSULT NOTE  ?Patient ID: ?Ronald House ?MRN: [id number] ?DOB/AGE: 1946-03-17 76 y.o. ? ?Admit date: 07/27/2021 ?Attending physician: Patrecia Pour, MD ?Primary Physician:  [name], MD ?Outpatient Cardiologist: Dr. Adrian House  ?Inpatient Cardiologist: Rex Kras, DO, [facility] ? ?Reason of consultation: Chest Pain  ?Referring physician: Dr. Darl Householder ? ?Chief complaint: chest pain.  ? ?HPI:  ?Ronald House is a 76 y.o. Caucasian male who presents with a chief complaint of " chest pain." His past medical history and cardiovascular risk factors include: History of prostate cancer, CAD with stenting to the large size mid LCx with 4 x 22 mm resolute DES 2016, multivessel CAD, hypertension, HLD, advance age. ? ?Patient presents to the hospital with concerns for precordial discomfort with exertion.  He has known history of CAD and since then has been managed medically and tries to walk at least 2 miles regularly.  However recently he started noticing chest tightness, substernally located, with walking or stressful situations, nonradiating, intensity 2 out of 10, improves with resting.  Patient states the symptoms are less intense compared to his unstable angina event in 2016 but similar. ? ?Due to the symptoms and nonspecific heaviness in bilateral lower extremities patient presents to the hospital for further evaluation and management. ? ?He is currently accompanied by his wife Otila Kluver at bedside. ? ?Denies any active chest pain or heart failure symptoms. ? ?ALLERGIES: ?No Known Allergies ? ?PAST MEDICAL HISTORY: ?Past Medical History:  ?Diagnosis Date  ? Anxiety   ? Arteriosclerosis of both carotid arteries 06/02/2018  ? Arthritis   ? "lower back" (11/24/2014)  ? Bulging lumbar disc   ? Chronic lower back pain   ? Coronary artery disease   ? DDD (degenerative disc disease), lumbar   ? Dyspnea on exertion 06/02/2018  ? GERD (gastroesophageal reflux disease)   ? Hematuria   ? Peyronie disease   ? Prostate cancer ([place])    ? Undiagnosed cardiac murmurs   ? childhood/unknown if present issue  ? ? ?PAST SURGICAL HISTORY: ?Past Surgical History:  ?Procedure Laterality Date  ? CARDIAC CATHETERIZATION N/A 11/24/2014  ? Procedure: Left Heart Cath and Coronary Angiography;  Surgeon: Ronald Prows, MD;  Location: [facility];  Service: Cardiovascular;  Laterality: N/A;  ? CARDIAC CATHETERIZATION N/A 11/24/2014  ? Procedure: Coronary Stent Intervention;  Surgeon: Ronald Prows, MD;  Location: [facility];  Service: Cardiovascular;  Laterality: N/A;  ? CORONARY ANGIOPLASTY WITH STENT PLACEMENT  11/24/2014  ? "1 stent"  ? FINGER FRACTURE SURGERY Right 1980's  ? index finger  ? FRACTURE SURGERY    ? OPEN TREATMENT ZYGOMATIC ARCH FRACTURE  ~ 1980  ? PROSTATE BIOPSY  <05/2011  ? RADIOACTIVE SEED IMPLANT  05/22/2011  ? Procedure: RADIOACTIVE SEED IMPLANT;  Surgeon: Ailene Rud, MD;  Location: [facility];  Service: Urology;  Laterality: N/A;  RAD TECH OK PER [facility]   ? TONSILLECTOMY AND ADENOIDECTOMY  1950's  ? ? ?FAMILY HISTORY: ?The patient's family history includes Benign prostatic hyperplasia in his father; Cancer in his father; Heart disease in his brother. ?  ?SOCIAL HISTORY:  ?The patient  reports that he has never smoked. He has never used smokeless tobacco. He reports current alcohol use of about 5.0 standard drinks per week. He reports that he does not use drugs. ? ?MEDICATIONS: ?Current Outpatient Medications  ?Medication Instructions  ? aspirin 81 mg, Oral, Daily  ? atorvastatin (LIPITOR) 80 MG tablet TAKE 1 TABLET(80  MG) BY MOUTH DAILY AT 6 PM  ? carvedilol (COREG) 3.125 MG tablet TAKE 1 TABLET(3.125 MG) BY MOUTH TWICE DAILY WITH A MEAL  ? cholecalciferol (VITAMIN D3) 1,000 Units, Oral, Daily  ? lidocaine 4 % 1 patch, Transdermal, Weekly  ? Multiple Vitamin (MULTIVITAMIN) tablet 1 tablet, Oral, Daily  ? nitroGLYCERIN (NITROSTAT) 0.4 mg, Sublingual, Every 5 min x3 PRN  ? olmesartan-hydrochlorothiazide  (BENICAR HCT) 40-12.5 MG tablet 1 tablet, Oral, Daily  ? Propylene Glycol (SYSTANE COMPLETE OP) 1 drop, Both Eyes, Daily PRN  ? tacrolimus (PROTOPIC) 0.1 % ointment Topical, Daily at bedtime, 30 day supply  ? terbinafine (LAMISIL) 250 mg, Oral, Daily  ? triamcinolone ointment (KENALOG) 0.1 % 1 application., Topical, 2 times daily PRN, NO CREAM  ? ? ?REVIEW OF SYSTEMS: ?Review of Systems  ?Cardiovascular:  Positive for chest pain and dyspnea on exertion. Negative for claudication, leg swelling, near-syncope, orthopnea, palpitations, paroxysmal nocturnal dyspnea and syncope.  ?Respiratory:  Positive for wheezing. Negative for cough and shortness of breath.   ?Neurological:  Positive for dizziness, light-headedness and vertigo.  ?All other systems reviewed and are negative. ? ?PHYSICAL EXAM: ? ?  07/28/2021  ?  9:14 AM 07/28/2021  ?  5:27 AM 07/27/2021  ?  8:23 PM  ?Vitals with BMI  ?Systolic 606 004 599  ?Diastolic 53 69 74  ?Pulse 55 45 54  ? ? ? ?Intake/Output Summary (Last 24 hours) at 07/28/2021 1250 ?Last data filed at 07/28/2021 0900 ?Gross per 24 hour  ?Intake 756.51 ml  ?Output --  ?Net 756.51 ml  ?  ?Net IO Since Admission: 756.51 mL [07/28/21 1250] ? ?CONSTITUTIONAL: Well-developed and well-nourished. No acute distress.  ?SKIN: Skin is warm and dry. No rash noted. No cyanosis. No pallor. No jaundice ?HEAD: Normocephalic and atraumatic.  ?EYES: No scleral icterus ?MOUTH/THROAT: Moist oral membranes.  ?NECK: No JVD present. No thyromegaly noted. No carotid bruits  ?LYMPHATIC: No visible cervical adenopathy.  ?CHEST Normal respiratory effort. No intercostal retractions  ?LUNGS: Clear to auscultation bilaterally.  No stridor. No wheezes. No rales.  ?CARDIOVASCULAR: Regular rate and rhythm, positive S1-S2, no murmurs rubs or gallops appreciated ?ABDOMINAL: Soft, nontender, nondistended, positive bowel sounds in all 4 quadrants, no apparent ascites.  ?EXTREMITIES: No pitting edema, warm to touch.  ?HEMATOLOGIC: No  significant bruising ?NEUROLOGIC: Oriented to person, place, and time. Nonfocal. Normal muscle tone.  ?PSYCHIATRIC: Normal mood and affect. Normal behavior. Cooperative ? ?RADIOLOGY: ?DG Chest 2 View ? ?Result Date: 07/27/2021 ?CLINICAL DATA:  chest pain EXAM: CHEST - 2 VIEW COMPARISON:  Chest radiograph dated November 24, 2014 FINDINGS: The cardiomediastinal silhouette is unchanged and normal in contour.Atherosclerotic calcifications. No pleural effusion. No pneumothorax. Calcified granuloma of the LEFT apex. No acute pleuroparenchymal abnormality. Visualized abdomen is unremarkable. Mild degenerative changes of the thoracic spine. IMPRESSION: No acute cardiopulmonary abnormality. Electronically Signed   By: Valentino Saxon M.D.   On: 07/27/2021 13:10   ? ?LABORATORY DATA: ?Lab Results  ?Component Value Date  ? WBC 6.6 07/28/2021  ? HGB 13.4 07/28/2021  ? HCT 41.6 07/28/2021  ? MCV 95.6 07/28/2021  ? PLT 187 07/28/2021  ?  ?Recent Labs  ?Lab 07/28/21 ?0118  ?NA 139  ?K 3.9  ?CL 105  ?CO2 28  ?BUN 25*  ?CREATININE 1.22  ?CALCIUM 8.7*  ?GLUCOSE 104*  ? ? ?Lipid Panel  ?Lab Results  ?Component Value Date  ? CHOL 171 01/14/2013  ? HDL 59 01/14/2013  ? [place] 98 01/14/2013  ? TRIG  70 01/14/2013  ? CHOLHDL 2.9 01/14/2013  ? ? ?BNP (last 3 results) ?No results for input(s): BNP in the last 8760 hours. ? ?HEMOGLOBIN A1C ?Lab Results  ?Component Value Date  ? HGBA1C 5.7 (H) 11/25/2014  ? MPG 117 11/25/2014  ? ? ?Cardiac Panel (last 3 results) ?Recent Labs  ?  07/27/21 ?1235 07/27/21 ?1435  ?TROPONINIHS 5 5  ? ? ? ?TSH ?Recent Labs  ?  07/28/21 ?0118  ?TSH 1.212  ?  ? ?CARDIAC DATABASE: ?EKG: ?07/27/2021: Sinus bradycardia, 59 bpm, without underlying ischemia injury pattern. ? ?Echocardiogram: ?12/01/2017: Left ventricle cavity is normal in size. Normal global wall motion. Doppler evidence of grade I (impaired) diastolic dysfunction, normal LAP. Calculated EF 63%. Trileaflet aortic valve with mild (Grade I) regurgitation.  Structurally normal mitral valve with trace regurgitation. Structurally normal tricuspid valve with mild regurgitation. No evidence of pulmonary hypertension. Compared to 02/15/2015, no significant change. ?

## 2021-07-28 NOTE — Progress Notes (Signed)
ANTICOAGULATION CONSULT NOTE  ? ?Pharmacy Consult for heparin ?Indication: chest pain/ACS ? ?No Known Allergies ? ?Patient Measurements: ?Height: 5' 10.5" (179.1 cm) ?Weight: 78.5 kg (173 lb) ?IBW/kg (Calculated) : 74.15 ?Heparin Dosing Weight: 78 kg  ? ?Vital Signs: ?Temp: 98.3 ?F (36.8 ?C) (05/14 1435) ?Temp Source: Oral (05/14 1435) ?BP: 111/61 (05/14 1435) ?Pulse Rate: 51 (05/14 1435) ? ?Labs: ?Recent Labs  ?  07/27/21 ?1235 07/27/21 ?1435 07/28/21 ?0118 07/28/21 ?1042 07/28/21 ?2046  ?HGB 15.0  --  12.7* 13.4  --   ?HCT 46.1  --  39.6 41.6  --   ?PLT 207  --  178 187  --   ?HEPARINUNFRC  --   --  0.65 0.75* 0.69  ?CREATININE 1.41*  --  1.22  --   --   ?TROPONINIHS 5 5  --   --   --   ? ? ? ?Estimated Creatinine Clearance: 54.9 mL/min (by C-G formula based on SCr of 1.22 mg/dL). ? ? ?Medical History: ?Past Medical History:  ?Diagnosis Date  ? Anxiety   ? Arteriosclerosis of both carotid arteries 06/02/2018  ? Arthritis   ? "lower back" (11/24/2014)  ? Bulging lumbar disc   ? Chronic lower back pain   ? Coronary artery disease   ? DDD (degenerative disc disease), lumbar   ? Dyspnea on exertion 06/02/2018  ? GERD (gastroesophageal reflux disease)   ? Hematuria   ? Peyronie disease   ? Prostate cancer ([place])   ? Undiagnosed cardiac murmurs   ? childhood/unknown if present issue  ? ? ?Medications:  ?Medications Prior to Admission  ?Medication Sig Dispense Refill Last Dose  ? aspirin EC 81 MG EC tablet Take 1 tablet (81 mg total) by mouth daily. 30 tablet 0 07/27/2021  ? atorvastatin (LIPITOR) 80 MG tablet TAKE 1 TABLET(80 MG) BY MOUTH DAILY AT 6 PM (Patient taking differently: Take 80 mg by mouth every evening.) 90 tablet 1 07/26/2021  ? carvedilol (COREG) 3.125 MG tablet TAKE 1 TABLET(3.125 MG) BY MOUTH TWICE DAILY WITH A MEAL (Patient taking differently: Take 3.125 mg by mouth 2 (two) times daily with a meal.) 180 tablet 1 07/27/2021 at 10.30 am  ? cholecalciferol (VITAMIN D3) 25 MCG (1000 UNIT) tablet Take 1,000 Units by  mouth daily.   07/27/2021  ? lidocaine 4 % Place 1 patch onto the skin once a week.   07/23/2021  ? Multiple Vitamin (MULTIVITAMIN) tablet Take 1 tablet by mouth daily.   07/27/2021  ? nitroGLYCERIN (NITROSTAT) 0.4 MG SL tablet Place 1 tablet (0.4 mg total) under the tongue every 5 (five) minutes x 3 doses as needed for chest pain. 25 tablet 12 unk last dose  ? olmesartan-hydrochlorothiazide (BENICAR HCT) 40-12.5 MG tablet TAKE 1 TABLET BY MOUTH DAILY (Patient taking differently: Take 1 tablet by mouth every evening.) 90 tablet 0 07/26/2021  ? Propylene Glycol (SYSTANE COMPLETE OP) Place 1 drop into both eyes daily as needed (dry eyes).   07/27/2021  ? tacrolimus (PROTOPIC) 0.1 % ointment Apply topically at bedtime. 30 day supply (Patient not taking: Reported on 07/27/2021) 100 g 1 Not Taking  ? terbinafine (LAMISIL) 250 MG tablet Take 1 tablet (250 mg total) by mouth daily. (Patient not taking: Reported on 05/22/2021) 30 tablet 1 Not Taking  ? triamcinolone ointment (KENALOG) 0.1 % Apply 1 application topically 2 (two) times daily as needed. NO CREAM (Patient not taking: Reported on 07/27/2021) 80 g 3 Not Taking  ? ? ?Assessment: ?70 YOM with shortness  of breath and chest tightness to start IV heparin for ACS. No anticoagulation PTA. ? ?Heparin level 0.69 and at goal.  ? ?Goal of Therapy:  ?Heparin level 0.3-0.7 units/ml ?Monitor platelets by anticoagulation protocol: Yes ?  ?Plan:  ?-Continue heparin at 1000 units/hr ?-Daily heparin level and CBC ? ?Hildred Laser, PharmD ?Clinical Pharmacist ?**Pharmacist phone directory can now be found on [email or web address] ([email or web address]).  Listed under [place]. ? ? ?

## 2021-07-28 NOTE — Progress Notes (Signed)
ANTICOAGULATION CONSULT NOTE  ? ?Pharmacy Consult for heparin ?Indication: chest pain/ACS ? ?No Known Allergies ? ?Patient Measurements: ?Height: 5' 10.5" (179.1 cm) ?Weight: 78.5 kg (173 lb) ?IBW/kg (Calculated) : 74.15 ?Heparin Dosing Weight: 78 kg  ? ?Vital Signs: ?Temp: 97.9 ?F (36.6 ?C) (05/14 0527) ?Temp Source: (P) Oral (05/14 0527) ?BP: 110/53 (05/14 0914) ?Pulse Rate: 55 (05/14 0914) ? ?Labs: ?Recent Labs  ?  07/27/21 ?1235 07/27/21 ?1435 07/28/21 ?0118 07/28/21 ?1042  ?HGB 15.0  --  12.7* 13.4  ?HCT 46.1  --  39.6 41.6  ?PLT 207  --  178 187  ?HEPARINUNFRC  --   --  0.65 0.75*  ?CREATININE 1.41*  --  1.22  --   ?TROPONINIHS 5 5  --   --   ? ? ? ?Estimated Creatinine Clearance: 54.9 mL/min (by C-G formula based on SCr of 1.22 mg/dL). ? ? ?Medical History: ?Past Medical History:  ?Diagnosis Date  ? Anxiety   ? Arteriosclerosis of both carotid arteries 06/02/2018  ? Arthritis   ? "lower back" (11/24/2014)  ? Bulging lumbar disc   ? Chronic lower back pain   ? Coronary artery disease   ? DDD (degenerative disc disease), lumbar   ? Dyspnea on exertion 06/02/2018  ? GERD (gastroesophageal reflux disease)   ? Hematuria   ? Peyronie disease   ? Prostate cancer ([place])   ? Undiagnosed cardiac murmurs   ? childhood/unknown if present issue  ? ? ?Medications:  ?Medications Prior to Admission  ?Medication Sig Dispense Refill Last Dose  ? aspirin EC 81 MG EC tablet Take 1 tablet (81 mg total) by mouth daily. 30 tablet 0 07/27/2021  ? atorvastatin (LIPITOR) 80 MG tablet TAKE 1 TABLET(80 MG) BY MOUTH DAILY AT 6 PM (Patient taking differently: Take 80 mg by mouth every evening.) 90 tablet 1 07/26/2021  ? carvedilol (COREG) 3.125 MG tablet TAKE 1 TABLET(3.125 MG) BY MOUTH TWICE DAILY WITH A MEAL (Patient taking differently: Take 3.125 mg by mouth 2 (two) times daily with a meal.) 180 tablet 1 07/27/2021 at 10.30 am  ? cholecalciferol (VITAMIN D3) 25 MCG (1000 UNIT) tablet Take 1,000 Units by mouth daily.   07/27/2021  ? lidocaine 4  % Place 1 patch onto the skin once a week.   07/23/2021  ? Multiple Vitamin (MULTIVITAMIN) tablet Take 1 tablet by mouth daily.   07/27/2021  ? nitroGLYCERIN (NITROSTAT) 0.4 MG SL tablet Place 1 tablet (0.4 mg total) under the tongue every 5 (five) minutes x 3 doses as needed for chest pain. 25 tablet 12 unk last dose  ? olmesartan-hydrochlorothiazide (BENICAR HCT) 40-12.5 MG tablet TAKE 1 TABLET BY MOUTH DAILY (Patient taking differently: Take 1 tablet by mouth every evening.) 90 tablet 0 07/26/2021  ? Propylene Glycol (SYSTANE COMPLETE OP) Place 1 drop into both eyes daily as needed (dry eyes).   07/27/2021  ? tacrolimus (PROTOPIC) 0.1 % ointment Apply topically at bedtime. 30 day supply (Patient not taking: Reported on 07/27/2021) 100 g 1 Not Taking  ? terbinafine (LAMISIL) 250 MG tablet Take 1 tablet (250 mg total) by mouth daily. (Patient not taking: Reported on 05/22/2021) 30 tablet 1 Not Taking  ? triamcinolone ointment (KENALOG) 0.1 % Apply 1 application topically 2 (two) times daily as needed. NO CREAM (Patient not taking: Reported on 07/27/2021) 80 g 3 Not Taking  ? ? ?Assessment: ?62 YOM with shortness of breath and chest tightness to start IV heparin for ACS. No anticoagulation PTA. ? ?Heparin level  0.75 and above therapeutic goal, likely still experiencing bolus effect. Hgb and platelets are stable and within normal limits this AM. No signs of bleeding noted.  ? ?Goal of Therapy:  ?Heparin level 0.3-0.7 units/ml ?Monitor platelets by anticoagulation protocol: Yes ?  ?Plan:  ?Decrease IV heparin infusion to 1000 units/hr ?F/u 8 hr heparin level ?Monitor daily Heparin level, CBC ?Monitor for signs and symptoms of bleeding ? ?Thank you for involving pharmacy in this patient's care. ? ?Elita Quick, PharmD ?PGY1 Ambulatory Care Pharmacy Resident ?07/28/2021 11:54 AM ? ?**Pharmacist phone directory can be found on [email or web address] listed under [place]** ?

## 2021-07-28 NOTE — Progress Notes (Signed)
*  PRELIMINARY RESULTS* ?Echocardiogram ?2D Echocardiogram has been performed. ? ?Samuel Germany ?07/28/2021, 1:41 PM ?

## 2021-07-28 NOTE — Progress Notes (Signed)
?Progress Note ? ?Patient: Ronald House DOB: [birth year]  ?DOA: 07/27/2021  DOS: 07/28/2021  ?  ?Brief hospital course: ?Ronald House is a 76 y.o. male with a history of prostate CA, lumbar disc disease, HTN, and CAD s/p LCx stent 2016 and 50% RCA stenosis managed medically who presented to the ED with increasing exertional chest discomfort and dyspnea. Cardiac enzymes and ECG reassuring. Patient admitted for unstable angina with cardiology recommendation for cardiac catheterization 5/15 and IV heparin.  ? ?Assessment and Plan: ?Unstable angina: Increasing severity of anginal symptoms with known multivessel CAD s/p LCx PCI.  ?- Continue IV heparin ?- Continue aspirin and high-intensity statin.  ?- Holding coreg due to bradycardia (TSH 1.212) ?- NPO p MN for cardiac catheterization 5/15.  ? ?HTN:  ?- Continue ARB-HCTZ ? ?HLD:  ?- Continue statin ? ?Pre-diabetes: Suggested by HbA1c 6%. Glucose wnl here.  ?- Consider initiating metformin ?- PCP follow up.  ? ?Lumbar degenerative disc disease: Noted, stable.  ? ?Vertigo: Symptoms currently quiescent. ? ?Mild AKI:  ?- Given IVF with improvement. Will monitor peri-procedurally  ? ?History of TIA:  ?- ASA, statin ? ?Prostate CA: Noted.   ? ?Subjective: No current chest pain. ? ?Objective: ?Vitals:  ? 07/27/21 2023 07/28/21 0527 07/28/21 0914 07/28/21 1435  ?BP: 132/74 122/69 (!) 110/53 111/61  ?Pulse: (!) 54 (!) 45 (!) 55 (!) 51  ?Resp: '18 18 20 15  '$ ?Temp: 98.6 ?F (37 ?C) 97.9 ?F (36.6 ?C)  98.3 ?F (36.8 ?C)  ?TempSrc: Oral (P) Oral  Oral  ?SpO2:   95% 94%  ?Weight:      ?Height:      ? ?Gen: 76 y.o. male in no distress ?Pulm: Nonlabored breathing room air. Clear ?CV: Regular bradycardia. No murmur, rub, or gallop. No JVD, no dependent edema. ?GI: Abdomen soft, non-tender, non-distended, with normoactive bowel sounds.  ?Ext: Warm, no deformities ?Skin: No rashes, lesions or ulcers on visualized skin. ?Neuro: Alert and oriented. No nystagmus or focal  neurological deficits. ?Psych: Judgement and insight appear fair. Mood euthymic & affect congruent. Behavior is appropriate.   ? ?Data Personally reviewed: ?CBC: ?Recent Labs  ?Lab 07/27/21 ?1235 07/28/21 ?0118 07/28/21 ?1042  ?WBC 7.0 7.5 6.6  ?HGB 15.0 12.7* 13.4  ?HCT 46.1 39.6 41.6  ?MCV 95.8 95.4 95.6  ?PLT 207 178 187  ? ?Basic Metabolic Panel: ?Recent Labs  ?Lab 07/27/21 ?1235 07/28/21 ?0118  ?NA 140 139  ?K 5.0 3.9  ?CL 105 105  ?CO2 29 28  ?GLUCOSE 102* 104*  ?BUN 23 25*  ?CREATININE 1.41* 1.22  ?CALCIUM 9.7 8.7*  ? ?GFR: ?Estimated Creatinine Clearance: 54.9 mL/min (by C-G formula based on SCr of 1.22 mg/dL). ?Liver Function Tests: ?No results for input(s): AST, ALT, ALKPHOS, BILITOT, PROT, ALBUMIN in the last 168 hours. ?No results for input(s): LIPASE, AMYLASE in the last 168 hours. ?No results for input(s): AMMONIA in the last 168 hours. ?Coagulation Profile: ?No results for input(s): INR, PROTIME in the last 168 hours. ?Cardiac Enzymes: ?No results for input(s): CKTOTAL, CKMB, CKMBINDEX, TROPONINI in the last 168 hours. ?BNP (last 3 results) ?No results for input(s): PROBNP in the last 8760 hours. ?HbA1C: ?Recent Labs  ?  07/28/21 ?1042  ?HGBA1C 6.0*  ? ?CBG: ?No results for input(s): GLUCAP in the last 168 hours. ?Lipid Profile: ?No results for input(s): CHOL, HDL, LDLCALC, TRIG, CHOLHDL, LDLDIRECT in the last 72 hours. ?Thyroid Function Tests: ?Recent Labs  ?  07/28/21 ?0118  ?TSH 1.212  ? ?  Anemia Panel: ?No results for input(s): VITAMINB12, FOLATE, FERRITIN, TIBC, IRON, RETICCTPCT in the last 72 hours. ?Urine analysis: ?No results found for: COLORURINE, APPEARANCEUR, [place], [place], [place], [place], [place], KETONESUR, PROTEINUR, [place], NITRITE, LEUKOCYTESUR ?Recent Results (from the past 240 hour(s))  ?Resp Panel by RT-PCR (Flu A&B, Covid) Nasopharyngeal Swab     Status: None  ? Collection Time: 07/27/21  4:55 PM  ? Specimen: Nasopharyngeal Swab; Nasopharyngeal(NP) swabs in vial  transport medium  ?Result Value Ref Range Status  ? SARS Coronavirus 2 by RT PCR NEGATIVE NEGATIVE Final  ?  Comment: (NOTE) ?SARS-CoV-2 target nucleic acids are NOT DETECTED. ? ?The SARS-CoV-2 RNA is generally detectable in upper respiratory ?specimens during the acute phase of infection. The lowest ?concentration of SARS-CoV-2 viral copies this assay can detect is ?138 copies/mL. A negative result does not preclude SARS-Cov-2 ?infection and should not be used as the sole basis for treatment or ?other patient management decisions. A negative result may occur with  ?improper specimen collection/handling, submission of specimen other ?than nasopharyngeal swab, presence of viral mutation(s) within the ?areas targeted by this assay, and inadequate number of viral ?copies(<138 copies/mL). A negative result must be combined with ?clinical observations, patient history, and epidemiological ?information. The expected result is Negative. ? ?Fact Sheet for Patients:  ?EntrepreneurPulse.com.au ? ?Fact Sheet for Healthcare Providers:  ?IncredibleEmployment.be ? ?This test is no t yet approved or cleared by the [place] FDA and  ?has been authorized for detection and/or diagnosis of SARS-CoV-2 by ?FDA under an Emergency Use Authorization (EUA). This EUA will remain  ?in effect (meaning this test can be used) for the duration of the ?COVID-19 declaration under Section 564(b)(1) of the Act, 21 ?U.S.C.section 360bbb-3(b)(1), unless the authorization is terminated  ?or revoked sooner.  ? ? ?  ? Influenza A by PCR NEGATIVE NEGATIVE Final  ? Influenza B by PCR NEGATIVE NEGATIVE Final  ?  Comment: (NOTE) ?The Xpert Xpress SARS-CoV-2/FLU/RSV plus assay is intended as an aid ?in the diagnosis of influenza from Nasopharyngeal swab specimens and ?should not be used as a sole basis for treatment. Nasal washings and ?aspirates are unacceptable for Xpert Xpress SARS-CoV-2/FLU/RSV ?testing. ? ?Fact  Sheet for Patients: ?EntrepreneurPulse.com.au ? ?Fact Sheet for Healthcare Providers: ?IncredibleEmployment.be ? ?This test is not yet approved or cleared by the [place] FDA and ?has been authorized for detection and/or diagnosis of SARS-CoV-2 by ?FDA under an Emergency Use Authorization (EUA). This EUA will remain ?in effect (meaning this test can be used) for the duration of the ?COVID-19 declaration under Section 564(b)(1) of the Act, 21 U.S.C. ?section 360bbb-3(b)(1), unless the authorization is terminated or ?revoked. ? ?Performed at [facility], [place] [address] [place], [place] ?[address] ?  ?   ?DG Chest 2 View ? ?Result Date: 07/27/2021 ?CLINICAL DATA:  chest pain EXAM: CHEST - 2 VIEW COMPARISON:  Chest radiograph dated November 24, 2014 FINDINGS: The cardiomediastinal silhouette is unchanged and normal in contour.Atherosclerotic calcifications. No pleural effusion. No pneumothorax. Calcified granuloma of the LEFT apex. No acute pleuroparenchymal abnormality. Visualized abdomen is unremarkable. Mild degenerative changes of the thoracic spine. IMPRESSION: No acute cardiopulmonary abnormality. Electronically Signed   By: Valentino Saxon M.D.   On: 07/27/2021 13:10  ? ?ECHOCARDIOGRAM COMPLETE ? ?Result Date: 07/28/2021 ?   ECHOCARDIOGRAM REPORT   Patient Name:   JESE COMELLA Date of Exam: 07/28/2021 Medical Rec #:  [id number]     Height:       70.5 in Accession #:  [id number]    Weight:       173.0 lb Date of Birth:  Dec 13, 1945     BSA:          1.973 m? Patient Age:    59 years      BP:           110/53 mmHg Patient Gender: M             HR:           55 bpm. Exam Location:  Inpatient Procedure: 2D Echo, Cardiac Doppler and Color Doppler Indications:    R07.9 Chest Pain  History:        Patient has prior history of Echocardiogram examinations, most                 recent 01/14/2013. CAD and Previous Myocardial Infarction, TIA;                 Risk  Factors:Hypertension and Dyslipidemia. Prostate cancer.  Sonographer:    Alvino Chapel RCS Referring Phys: [id number] [place]  1. Left ventricular ejection fraction, by estimation, is 70 to 75%. The left ventricle has hyp

## 2021-07-28 NOTE — Progress Notes (Signed)
ANTICOAGULATION CONSULT NOTE  ? ?Pharmacy Consult for heparin ?Indication: chest pain/ACS ? ?No Known Allergies ? ?Patient Measurements: ?Height: 5' 10.5" (179.1 cm) ?Weight: 78.5 kg (173 lb) ?IBW/kg (Calculated) : 74.15 ?Heparin Dosing Weight: 78 kg  ? ?Vital Signs: ?Temp: 98.6 ?F (37 ?C) (05/13 2023) ?Temp Source: Oral (05/13 2023) ?BP: 132/74 (05/13 2023) ?Pulse Rate: 54 (05/13 2023) ? ?Labs: ?Recent Labs  ?  07/27/21 ?1235 07/27/21 ?1435 07/28/21 ?0118  ?HGB 15.0  --  12.7*  ?HCT 46.1  --  39.6  ?PLT 207  --  178  ?HEPARINUNFRC  --   --  0.65  ?CREATININE 1.41*  --  1.22  ?TROPONINIHS 5 5  --   ? ? ? ?Estimated Creatinine Clearance: 54.9 mL/min (by C-G formula based on SCr of 1.22 mg/dL). ? ? ?Medical History: ?Past Medical History:  ?Diagnosis Date  ? Anxiety   ? Arteriosclerosis of both carotid arteries 06/02/2018  ? Arthritis   ? "lower back" (11/24/2014)  ? Bulging lumbar disc   ? Chronic lower back pain   ? Coronary artery disease   ? DDD (degenerative disc disease), lumbar   ? Dyspnea on exertion 06/02/2018  ? GERD (gastroesophageal reflux disease)   ? Hematuria   ? Peyronie disease   ? Prostate cancer ([place])   ? Undiagnosed cardiac murmurs   ? childhood/unknown if present issue  ? ? ?Medications:  ?Medications Prior to Admission  ?Medication Sig Dispense Refill Last Dose  ? aspirin EC 81 MG EC tablet Take 1 tablet (81 mg total) by mouth daily. 30 tablet 0 07/27/2021  ? atorvastatin (LIPITOR) 80 MG tablet TAKE 1 TABLET(80 MG) BY MOUTH DAILY AT 6 PM (Patient taking differently: Take 80 mg by mouth every evening.) 90 tablet 1 07/26/2021  ? carvedilol (COREG) 3.125 MG tablet TAKE 1 TABLET(3.125 MG) BY MOUTH TWICE DAILY WITH A MEAL (Patient taking differently: Take 3.125 mg by mouth 2 (two) times daily with a meal.) 180 tablet 1 07/27/2021 at 10.30 am  ? cholecalciferol (VITAMIN D3) 25 MCG (1000 UNIT) tablet Take 1,000 Units by mouth daily.   07/27/2021  ? lidocaine 4 % Place 1 patch onto the skin once a week.    07/23/2021  ? Multiple Vitamin (MULTIVITAMIN) tablet Take 1 tablet by mouth daily.   07/27/2021  ? nitroGLYCERIN (NITROSTAT) 0.4 MG SL tablet Place 1 tablet (0.4 mg total) under the tongue every 5 (five) minutes x 3 doses as needed for chest pain. 25 tablet 12 unk last dose  ? olmesartan-hydrochlorothiazide (BENICAR HCT) 40-12.5 MG tablet TAKE 1 TABLET BY MOUTH DAILY (Patient taking differently: Take 1 tablet by mouth every evening.) 90 tablet 0 07/26/2021  ? Propylene Glycol (SYSTANE COMPLETE OP) Place 1 drop into both eyes daily as needed (dry eyes).   07/27/2021  ? tacrolimus (PROTOPIC) 0.1 % ointment Apply topically at bedtime. 30 day supply (Patient not taking: Reported on 07/27/2021) 100 g 1 Not Taking  ? terbinafine (LAMISIL) 250 MG tablet Take 1 tablet (250 mg total) by mouth daily. (Patient not taking: Reported on 05/22/2021) 30 tablet 1 Not Taking  ? triamcinolone ointment (KENALOG) 0.1 % Apply 1 application topically 2 (two) times daily as needed. NO CREAM (Patient not taking: Reported on 07/27/2021) 80 g 3 Not Taking  ? ? ?Assessment: ?2 YOM with shortness of breath and chest tightness to start IV heparin for ACS. H/H and Plt wnl. SCr elevated  ? ?Initial heparin level: 0.65, no infusion issues or s/sx of bleeding  reported ? ?Goal of Therapy:  ?Heparin level 0.3-0.7 units/ml ?Monitor platelets by anticoagulation protocol: Yes ?  ?Plan:  ?-Continue heparin infusion at 1000 units/hr ?-F/u 8 hr HL ?-Monitor daily HL, CBC, and s/s of bleeding  ? ?Georga Bora, PharmD ?Clinical Pharmacist ?07/28/2021 2:38 AM ?Please check AMION for all [place] numbers ? ? ? ?

## 2021-07-29 ENCOUNTER — Encounter (HOSPITAL_COMMUNITY)
Admission: EM | Disposition: A | Discharge status: Home / Self Care | Payer: Self-pay | Source: Home / Self Care | Attending: Family Medicine

## 2021-07-29 ENCOUNTER — Other Ambulatory Visit (HOSPITAL_COMMUNITY): Payer: Self-pay

## 2021-07-29 HISTORY — PX: LEFT HEART CATH AND CORONARY ANGIOGRAPHY: CATH118249

## 2021-07-29 HISTORY — PX: CORONARY BALLOON ANGIOPLASTY: CATH118233

## 2021-07-29 LAB — BASIC METABOLIC PANEL
Anion gap: 5 (ref 5–15)
BUN: 21 mg/dL (ref 8–23)
CO2: 27 mmol/L (ref 22–32)
Calcium: 8.6 mg/dL — ABNORMAL LOW (ref 8.9–10.3)
Chloride: 105 mmol/L (ref 98–111)
Creatinine, Ser: 1.33 mg/dL — ABNORMAL HIGH (ref 0.61–1.24)
GFR, Estimated: 56 mL/min — ABNORMAL LOW (ref >60)
Glucose, Bld: 103 mg/dL — ABNORMAL HIGH (ref 70–99)
Potassium: 4 mmol/L (ref 3.5–5.1)
Sodium: 137 mmol/L (ref 135–145)

## 2021-07-29 LAB — CBC
HCT: 41 % (ref 39.0–52.0)
Hemoglobin: 13.5 g/dL (ref 13.0–17.0)
MCH: 31.3 pg (ref 26.0–34.0)
MCHC: 32.9 g/dL (ref 30.0–36.0)
MCV: 94.9 fL (ref 80.0–100.0)
Platelets: 186 10*3/uL (ref 150–400)
RBC: 4.32 MIL/uL (ref 4.22–5.81)
RDW: 13.3 % (ref 11.5–15.5)
WBC: 7.8 10*3/uL (ref 4.0–10.5)
nRBC: 0 % (ref 0.0–0.2)

## 2021-07-29 LAB — LIPID PANEL
Cholesterol: 125 mg/dL (ref 0–200)
HDL: 64 mg/dL (ref >40)
LDL Cholesterol: 55 mg/dL (ref 0–99)
Total CHOL/HDL Ratio: 2 RATIO
Triglycerides: 32 mg/dL (ref <150)
VLDL: 6 mg/dL (ref 0–40)

## 2021-07-29 LAB — HEPARIN LEVEL (UNFRACTIONATED): Heparin Unfractionated: 0.73 IU/mL — ABNORMAL HIGH (ref 0.30–0.70)

## 2021-07-29 LAB — POCT ACTIVATED CLOTTING TIME: Activated Clotting Time: 287 seconds

## 2021-07-29 LAB — LDL CHOLESTEROL, DIRECT: Direct LDL: 44.9 mg/dL (ref 0–99)

## 2021-07-29 SURGERY — LEFT HEART CATH AND CORONARY ANGIOGRAPHY
Anesthesia: LOCAL

## 2021-07-29 MED ORDER — SODIUM CHLORIDE 0.9 % IV SOLN: 250.0000 mL | INTRAVENOUS | Status: DC | PRN

## 2021-07-29 MED ORDER — VERAPAMIL HCL 2.5 MG/ML IV SOLN: INTRAVENOUS | Status: DC | PRN

## 2021-07-29 MED ORDER — HEPARIN SODIUM (PORCINE) 1000 UNIT/ML IJ SOLN
INTRAMUSCULAR | Status: DC | PRN
  Administered 2021-07-29: 1000 [IU] via INTRAVENOUS
  Administered 2021-07-29 (×2): 4000 [IU] via INTRAVENOUS

## 2021-07-29 MED ORDER — FAMOTIDINE IN NACL 20-0.9 MG/50ML-% IV SOLN
INTRAVENOUS | Status: AC
  Filled 2021-07-29: qty 50

## 2021-07-29 MED ORDER — CLOPIDOGREL BISULFATE 75 MG PO TABS: 75.0000 mg | ORAL_TABLET | Freq: Every day | ORAL | Status: DC

## 2021-07-29 MED ORDER — VERAPAMIL HCL 2.5 MG/ML IV SOLN
INTRAVENOUS | Status: AC
  Filled 2021-07-29: qty 2

## 2021-07-29 MED ORDER — LIDOCAINE HCL (PF) 1 % IJ SOLN
INTRAMUSCULAR | Status: DC | PRN
  Administered 2021-07-29: 2 mL

## 2021-07-29 MED ORDER — FENTANYL CITRATE (PF) 100 MCG/2ML IJ SOLN
INTRAMUSCULAR | Status: AC
  Filled 2021-07-29: qty 2

## 2021-07-29 MED ORDER — NITROGLYCERIN 1 MG/10 ML FOR IR/CATH LAB
INTRA_ARTERIAL | Status: DC | PRN
  Administered 2021-07-29: 100 ug
  Administered 2021-07-29: 200 ug

## 2021-07-29 MED ORDER — HEPARIN (PORCINE) IN NACL 1000-0.9 UT/500ML-% IV SOLN
INTRAVENOUS | Status: AC
  Filled 2021-07-29: qty 1000

## 2021-07-29 MED ORDER — IOHEXOL 350 MG/ML SOLN
INTRAVENOUS | Status: DC | PRN
  Administered 2021-07-29: 115 mL

## 2021-07-29 MED ORDER — SODIUM CHLORIDE 0.9% FLUSH: 3.0000 mL | Freq: Two times a day (BID) | INTRAVENOUS | Status: DC

## 2021-07-29 MED ORDER — FENTANYL CITRATE (PF) 100 MCG/2ML IJ SOLN
INTRAMUSCULAR | Status: DC | PRN
  Administered 2021-07-29: 50 ug via INTRAVENOUS
  Administered 2021-07-29: 25 ug via INTRAVENOUS

## 2021-07-29 MED ORDER — LIDOCAINE HCL (PF) 1 % IJ SOLN
INTRAMUSCULAR | Status: AC
  Filled 2021-07-29: qty 30

## 2021-07-29 MED ORDER — HEPARIN (PORCINE) IN NACL 1000-0.9 UT/500ML-% IV SOLN
INTRAVENOUS | Status: DC | PRN
Start: 2021-07-29 — End: 2021-07-29
  Administered 2021-07-29 (×2): 500 mL

## 2021-07-29 MED ORDER — CLOPIDOGREL BISULFATE 300 MG PO TABS
ORAL_TABLET | ORAL | Status: AC
  Filled 2021-07-29: qty 2

## 2021-07-29 MED ORDER — SODIUM CHLORIDE 0.9% FLUSH
3.0000 mL | INTRAVENOUS | Status: DC | PRN
Start: 2021-07-29 — End: 2021-07-29

## 2021-07-29 MED ORDER — NITROGLYCERIN 1 MG/10 ML FOR IR/CATH LAB
INTRA_ARTERIAL | Status: AC
  Filled 2021-07-29: qty 10

## 2021-07-29 MED ORDER — CLOPIDOGREL BISULFATE 75 MG PO TABS
75.0000 mg | ORAL_TABLET | Freq: Every day | ORAL | 0 refills | Status: DC
  Filled 2021-07-29: qty 30, 30d supply, fill #0

## 2021-07-29 MED ORDER — HEPARIN SODIUM (PORCINE) 1000 UNIT/ML IJ SOLN
INTRAMUSCULAR | Status: AC
  Filled 2021-07-29: qty 10

## 2021-07-29 MED ORDER — CLOPIDOGREL BISULFATE 300 MG PO TABS
ORAL_TABLET | ORAL | Status: DC | PRN
Start: 2021-07-29 — End: 2021-07-29
  Administered 2021-07-29: 600 mg via ORAL

## 2021-07-29 MED ORDER — MIDAZOLAM HCL 2 MG/2ML IJ SOLN
INTRAMUSCULAR | Status: AC
  Filled 2021-07-29: qty 2

## 2021-07-29 MED ORDER — MIDAZOLAM HCL 2 MG/2ML IJ SOLN
INTRAMUSCULAR | Status: DC | PRN
Start: 2021-07-29 — End: 2021-07-29
  Administered 2021-07-29: 2 mg via INTRAVENOUS

## 2021-07-29 MED ORDER — SODIUM CHLORIDE 0.9 % WEIGHT BASED INFUSION: 1.0000 mL/kg/h | INTRAVENOUS | Status: DC

## 2021-07-29 MED ORDER — FAMOTIDINE IN NACL 20-0.9 MG/50ML-% IV SOLN
INTRAVENOUS | Status: DC | PRN
  Administered 2021-07-29: 20 mg via INTRAVENOUS

## 2021-07-29 SURGICAL SUPPLY — 14 items
BALLN SCOREFLEX 4.0X15 (BALLOONS) ×2
BALLOON SCOREFLEX 4.0X15 (BALLOONS) IMPLANT
CATH LAUNCHER 6FR EBU3.5 (CATHETERS) ×1 IMPLANT
CATH OPTITORQUE TIG 4.0 5F (CATHETERS) ×1 IMPLANT
DEVICE RAD COMP TR BAND LRG (VASCULAR PRODUCTS) ×1 IMPLANT
GLIDESHEATH SLEND A-KIT 6F 22G (SHEATH) ×1 IMPLANT
GUIDEWIRE INQWIRE 1.5J.035X260 (WIRE) IMPLANT
INQWIRE 1.5J .035X260CM (WIRE) ×2
KIT ENCORE 26 ADVANTAGE (KITS) ×1 IMPLANT
KIT HEART LEFT (KITS) ×3 IMPLANT
PACK CARDIAC CATHETERIZATION (CUSTOM PROCEDURE TRAY) ×3 IMPLANT
TRANSDUCER W/STOPCOCK (MISCELLANEOUS) ×3 IMPLANT
TUBING CIL FLEX 10 FLL-RA (TUBING) ×3 IMPLANT
WIRE COUGAR XT STRL 190CM (WIRE) ×1 IMPLANT

## 2021-07-29 NOTE — Progress Notes (Signed)
Pt. Sitting up in chair without complaints. R radial site intact;  r neurovascular site intact. Pt.right radial wave for - Barbeau and reverse barbeau intact. Monitoring. ?

## 2021-07-29 NOTE — Progress Notes (Signed)
CARDIAC REHAB PHASE I  ? ?PTCA education completed with pt. Pt educated on importance of ASA and Plavix. Pt given heart healthy and diabetic diets. Reviewed site care, restrictions, and exercise guidelines. Will refer to CRP II [place]. ? ?[id number] ?Rufina Falco, RN BSN ?07/29/2021 ?11:25 AM ? ?

## 2021-07-29 NOTE — Progress Notes (Signed)
Discussed personally with patient's wife in detail. Stable for discharge. I will set up OV in 2 weeks. ?

## 2021-07-29 NOTE — Progress Notes (Signed)
Patient does not currently have discharge order in. Spoke with Einar Gip, MD at 1400 who stated that he would be putting the order in.  ?

## 2021-07-29 NOTE — Interval H&P Note (Signed)
History and Physical Interval Note: ? ?07/29/2021 ?7:37 AM ? ?Ronald House  has presented today for surgery, with the diagnosis of unstable angina.  The various methods of treatment have been discussed with the patient and family. After consideration of risks, benefits and other options for treatment, the patient has consented to  Procedure(s): ?LEFT HEART CATH AND CORONARY ANGIOGRAPHY (N/A)  and possible angioplasty as a surgical intervention.  The patient's history has been reviewed, patient examined, no change in status, stable for surgery.  I have reviewed the patient's chart and labs.  Questions were answered to the patient's satisfaction.   ? ? ?Adrian Prows ? ? ?

## 2021-07-29 NOTE — Discharge Summary (Signed)
Physician Discharge Summary  ?Patient ID: ?Ronald House ?MRN: [id number] ?DOB/AGE: June 21, 1945 76 y.o. ?Ronald Seashore, MD  ? ?Admit date: 07/27/2021 ?Discharge date: 07/29/2021 ? ?Primary Discharge Diagnosis ?1.  Unstable angina pectoris ?2.  Coronary artery disease native vessel with unstable angina pectoris ?3.  Hypercholesterolemia ?4.  Hyperglycemia ? ? ?Significant Diagnostic Studies: ? ?Echocardiogram 07/28/2021:  ?1. Left ventricular ejection fraction, by estimation, is 70 to 75%. The left ventricle has hyperdynamic function. The left ventricle has no regional wall motion abnormalities. There is mild asymmetric left ventricular hypertrophy of the basal-septal  ?segment. Left ventricular diastolic parameters are consistent with Grade I diastolic dysfunction (impaired relaxation). ? 2. Right ventricular systolic function is normal. The right ventricular size is normal. There is normal pulmonary artery systolic pressure. The estimated right ventricular systolic pressure is 44.3 mmHg. ? 3. The mitral valve is normal in structure. No evidence of mitral valve regurgitation. No evidence of mitral stenosis. ? 4. The aortic valve is tricuspid. Aortic valve regurgitation is trivial. ? 5. The inferior vena cava is normal in size with greater than 50% respiratory variability, suggesting right atrial pressure of 3 mmHg. ? ?Left Heart Catheterization 07/29/21:  ?LV: 128/-5, EDP 6 mmHg, AO 135/61, mean 90 mmHg.  No pressure gradient across the aortic valve.  LVEF 55 to 60% with no significant mitral regurgitation. ?RCA: Dominant, smooth and normal.  Compared to 2016, mid RCA stenosis of 50% no longer present. ?LM: Mildly calcified, no significant disease. ?LAD: Large vessel, gives origin to moderate-sized diagonals and proximal and mid segment, after the origin of D1, there is 10 to 15% mild luminal irregularity ?LCx: Very large vessel.  Gives origin to small OM1, large OM 2 after conversion to a small degree circumflex.   Mid segment of this circumflex has a 4.0 x 22 mm resolute DES placed on 11/24/2014, has in-stent 80% stenosis.  Mild stenosis is also evident in the distal end of the stent. ?Intervention data: Successful PTCA and balloon angioplasty with a 4.0 x 15 mm score flex balloon, entire stented segment and distal to the stent balloon angioplasty was performed, 0% distal stenosis and stent like results.  TIMI-3 flow was maintained. ? ?Recommendation: Patient will need Plavix for at least 4 weeks since balloon angioplasty, aspirin indefinitely, patient has at least a 5-10 % chance of restenosis given excellent result and stent like results but if you have return of symptoms, we could certainly consider repeat stenting.  115 mL contrast utilized. ? ?Radiology: ?DG Chest 2 View: 07/27/2021 ?CLINICAL DATA:  chest pain EXAM: CHEST - 2 VIEW COMPARISON:  Chest radiograph dated November 24, 2014 FINDINGS: The cardiomediastinal silhouette is unchanged and normal in contour.Atherosclerotic calcifications. No pleural effusion. No pneumothorax. Calcified granuloma of the LEFT apex. No acute pleuroparenchymal abnormality. Visualized abdomen is unremarkable. Mild degenerative changes of the thoracic spine. IMPRESSION: No acute cardiopulmonary abnormality.  ? ? ?Hospital Course: Ronald House is a 76 y.o. male  patient known coronary artery disease and had undergone DES stent implantation in 2016 to large circumflex coronary artery, for the past 1 month he has been having exertional chest tightness.  He the emergency room for the symptoms and also felt marked weakness when he walked in his legs.  He was ruled out for myocardial infarction, EKG did not reveal any acute ischemic changes however his exertional chest pain symptoms where classic for angina pectoris and hence he was scheduled for cardiac catheterization. ? ?Cardiac catheterization reviewed surprisingly late stent restenosis in  the circumflex coronary artery for which he underwent  balloon angioplasty with excellent results, with stent-like results the lesion was left alone.  As he she was hemodynamically stable, I felt comfortable in discharging home the same day after PCI.  Discussed with his wife extensively regarding his findings during cardiac catheterization prior ? ?Recommendations on discharge: Although new onset angina pectoris in the past 1 month, at most it is intermediate coronary syndrome.  We can use aspirin indefinitely and Plavix for weeks to 12 weeks.  Lipids are at goal, blood pressure is well controlled.  He does have mild stage IIIa chronic kidney disease that has remained stable.  Continue high intensity statins, ARB and low-dose beta-blockers.  He will be set up to be seen in the office in 2 weeks. ? ?Discharge Exam: ? ?  07/29/2021  ?  1:13 PM 07/29/2021  ? 12:34 PM 07/29/2021  ? 12:13 PM  ?Vitals with BMI  ?Systolic 656 812 751  ?Diastolic 76 69 67  ?Pulse 55 53 50  ?  ? ?Physical Exam ?Neck:  ?   Vascular: No JVD.  ?Cardiovascular:  ?   Rate and Rhythm: Normal rate and regular rhythm.  ?   Pulses: Intact distal pulses.  ?   Heart sounds: Normal heart sounds. No murmur heard. ?  No gallop.  ?Pulmonary:  ?   Effort: Pulmonary effort is normal.  ?   Breath sounds: Normal breath sounds.  ?Abdominal:  ?   General: Bowel sounds are normal.  ?   Palpations: Abdomen is soft.  ?Musculoskeletal:  ?   Right lower leg: No edema.  ?   Left lower leg: No edema.  ? ?Labs: ?  ?Lab Results  ?Component Value Date  ? WBC 7.8 07/29/2021  ? HGB 13.5 07/29/2021  ? HCT 41.0 07/29/2021  ? MCV 94.9 07/29/2021  ? PLT 186 07/29/2021  ?  ?Recent Labs  ?Lab 07/29/21 ?0223  ?NA 137  ?K 4.0  ?CL 105  ?CO2 27  ?BUN 21  ?CREATININE 1.33*  ?CALCIUM 8.6*  ?GLUCOSE 103*  ? ? ?Lipid Panel  ?   ?Component Value Date/Time  ? CHOL 125 07/29/2021 0223  ? TRIG 32 07/29/2021 0223  ? HDL 64 07/29/2021 0223  ? CHOLHDL 2.0 07/29/2021 0223  ? VLDL 6 07/29/2021 0223  ? LDLCALC 55 07/29/2021 0223  ?HEMOGLOBIN A1C ?Lab  Results  ?Component Value Date  ? HGBA1C 6.0 (H) 07/28/2021  ? MPG 125.5 07/28/2021  ? ? ?Cardiac Panel (last 3 results) ?Recent Labs  ?  07/27/21 ?1235 07/27/21 ?1435  ?TROPONINIHS 5 5  ?  ? ?TSH ?Recent Labs  ?  07/28/21 ?0118  ?TSH 1.212  ? ? ?FOLLOW UP PLANS AND APPOINTMENTS ?Discharge Instructions   ? ? Amb Referral to Cardiac Rehabilitation   Complete by: As directed ?  ? Diagnosis: PTCA  ? After initial evaluation and assessments completed: Virtual Based Care may be provided alone or in conjunction with Phase 2 Cardiac Rehab based on patient barriers.: Yes  ? ?  ? ?Allergies as of 07/29/2021   ?No Known Allergies ?  ? ?  ?Medication List  ?  ? ?TAKE these medications   ? ?aspirin 81 MG EC tablet ?Take 1 tablet (81 mg total) by mouth daily. ?  ?atorvastatin 80 MG tablet ?Commonly known as: LIPITOR ?TAKE 1 TABLET(80 MG) BY MOUTH DAILY AT 6 PM ?What changed: See the new instructions. ?  ?carvedilol 3.125 MG tablet ?Commonly known as: COREG ?  TAKE 1 TABLET(3.125 MG) BY MOUTH TWICE DAILY WITH A MEAL ?What changed: See the new instructions. ?  ?cholecalciferol 25 MCG (1000 UNIT) tablet ?Commonly known as: VITAMIN D3 ?Take 1,000 Units by mouth daily. ?  ?clopidogrel 75 MG tablet ?Commonly known as: Plavix ?Take 1 tablet (75 mg total) by mouth daily. ?  ?lidocaine 4 % ?Place 1 patch onto the skin once a week. ?  ?multivitamin tablet ?Take 1 tablet by mouth daily. ?  ?nitroGLYCERIN 0.4 MG SL tablet ?Commonly known as: NITROSTAT ?Place 1 tablet (0.4 mg total) under the tongue every 5 (five) minutes x 3 doses as needed for chest pain. ?  ?olmesartan-hydrochlorothiazide 40-12.5 MG tablet ?Commonly known as: BENICAR HCT ?TAKE 1 TABLET BY MOUTH DAILY ?What changed: when to take this ?  ?SYSTANE COMPLETE OP ?Place 1 drop into both eyes daily as needed (dry eyes). ?  ? ?  ? ? Follow-up Information   ? ? Adrian Prows, MD. Schedule an appointment as soon as possible for a visit on 08/14/2021.   ?Specialty: Cardiology ?Why: 08/14/21  w/you '@3'$ :15 ?Contact information: ?[address] ?[address] ?[place] [address] ?[phone] ? ? ?  ?  ? ?  ?  ? ?  ? ? ? ?Adrian Prows, MD, [facility] ?07/29/2021, 6:21 PM ?Office: [phone]   ?

## 2021-07-29 NOTE — Progress Notes (Signed)
ANTICOAGULATION CONSULT NOTE - Follow Up Consult ? ?Pharmacy Consult for heparin ?Indication:  USAP ? ?Labs: ?Recent Labs  ?  07/27/21 ?1235 07/27/21 ?1235 07/27/21 ?1435 07/28/21 ?0118 07/28/21 ?1042 07/28/21 ?2046 07/29/21 ?0223  ?HGB 15.0  --   --  12.7* 13.4  --  13.5  ?HCT 46.1  --   --  39.6 41.6  --  41.0  ?PLT 207  --   --  178 187  --  186  ?HEPARINUNFRC  --    < >  --  0.65 0.75* 0.69 0.73*  ?CREATININE 1.41*  --   --  1.22  --   --  1.33*  ?TROPONINIHS 5  --  5  --   --   --   --   ? < > = values in this interval not displayed.  ? ? ?Assessment: ?76yo male slightly supratherapeutic on heparin after one level at upper end of goal; no infusion issues or signs of bleeding per RN. ? ?Goal of Therapy:  ?Heparin level 0.3-0.7 units/ml ?  ?Plan:  ?Will decrease heparin infusion by 1-2 units/kg/hr to 900 units/hr and check level in 8 hours.   ? ?Wynona Neat, PharmD, BCPS  ?07/29/2021,5:05 AM ? ? ?

## 2021-07-29 NOTE — Progress Notes (Signed)
Report received from Dewaine Oats, RN at 1300. RN stated that band was off, discharge instructions were reviewed with patients. Currently awaiting MD to call back. Went in to assess patient at 1300 and R radial site was WDL. Patient and wife stated that discharge instructions were not reviewed and AVS was not given to patient. Assured patient that I will review these with him and the wife and that I will call Einar Gip, MD to see if he will come and see patient. Einar Gip, MD paged.  ?

## 2021-07-29 NOTE — Progress Notes (Signed)
Tr Band removed from R radial artery, very slight ooze noted; pressure held x 5 min; hemostasis achieved. R hand neurovascular status intact, a dry, sterile dressing applied. D/c instructions reviewed. Pt. Advised the MD would come by before discharge. Call placed Dr. Einar Gip  ?

## 2021-07-30 ENCOUNTER — Encounter (HOSPITAL_COMMUNITY): Payer: Self-pay | Admitting: Cardiology

## 2021-07-30 LAB — LIPOPROTEIN A (LPA): Lipoprotein (a): 44.6 nmol/L — ABNORMAL HIGH (ref <75.0)

## 2021-07-31 ENCOUNTER — Telehealth: Payer: Self-pay

## 2021-07-31 NOTE — Telephone Encounter (Signed)
Location of hospitalization: [facility] ?Reason for hospitalization: 80% blockage ?Date of discharge: 07/29/2021 ?Date of first communication with patient: today ?Person contacting patient: Gaye Alken, [place] ?Current symptoms: none ?Do you understand why you were in the Hospital: Yes ?Questions regarding discharge instructions: None ?Where were you discharged to: Home ?Medications reviewed: Yes ?Allergies reviewed: Yes ?Dietary changes reviewed: Yes. Discussed low fat and low salt diet.  ?Referals reviewed: NA ?Activities of Daily Living: Able to with mild limitations ?Any transportation issues/concerns: None ?Any patient concerns: None ?Confirmed importance & date/time of Follow up appt: Yes ?Confirmed with patient if condition begins to worsen call. Pt was given the office number and encouraged to call back with questions or concerns: Yes  ? ?

## 2021-08-06 ENCOUNTER — Telehealth (HOSPITAL_COMMUNITY): Payer: Self-pay

## 2021-08-06 NOTE — Telephone Encounter (Signed)
Pt is not interested in the cardiac rehab program. Closed referral 

## 2021-08-07 ENCOUNTER — Emergency Department (HOSPITAL_COMMUNITY)
Admission: EM | Admit: 2021-08-07 | Discharge: 2021-08-07 | Disposition: A | Discharge status: Home / Self Care | Payer: Medicare PPO | Attending: Emergency Medicine | Admitting: Emergency Medicine

## 2021-08-07 ENCOUNTER — Other Ambulatory Visit: Payer: Self-pay

## 2021-08-07 ENCOUNTER — Encounter (HOSPITAL_COMMUNITY): Payer: Self-pay

## 2021-08-07 DIAGNOSIS — Z7982 Long term (current) use of aspirin: Secondary | ICD-10-CM | POA: Diagnosis not present

## 2021-08-07 DIAGNOSIS — R001 Bradycardia, unspecified: Secondary | ICD-10-CM | POA: Diagnosis not present

## 2021-08-07 DIAGNOSIS — R202 Paresthesia of skin: Secondary | ICD-10-CM | POA: Insufficient documentation

## 2021-08-07 DIAGNOSIS — R9431 Abnormal electrocardiogram [ECG] [EKG]: Secondary | ICD-10-CM | POA: Diagnosis not present

## 2021-08-07 DIAGNOSIS — I2510 Atherosclerotic heart disease of native coronary artery without angina pectoris: Secondary | ICD-10-CM | POA: Insufficient documentation

## 2021-08-07 DIAGNOSIS — R200 Anesthesia of skin: Secondary | ICD-10-CM | POA: Diagnosis present

## 2021-08-07 DIAGNOSIS — Z7902 Long term (current) use of antithrombotics/antiplatelets: Secondary | ICD-10-CM | POA: Diagnosis not present

## 2021-08-07 LAB — CBC WITH DIFFERENTIAL/PLATELET
Abs Immature Granulocytes: 0.03 10*3/uL (ref 0.00–0.07)
Basophils Absolute: 0 10*3/uL (ref 0.0–0.1)
Basophils Relative: 1 %
Eosinophils Absolute: 0.2 10*3/uL (ref 0.0–0.5)
Eosinophils Relative: 3 %
HCT: 43.4 % (ref 39.0–52.0)
Hemoglobin: 14.6 g/dL (ref 13.0–17.0)
Immature Granulocytes: 1 %
Lymphocytes Relative: 36 %
Lymphs Abs: 2.2 10*3/uL (ref 0.7–4.0)
MCH: 31.7 pg (ref 26.0–34.0)
MCHC: 33.6 g/dL (ref 30.0–36.0)
MCV: 94.1 fL (ref 80.0–100.0)
Monocytes Absolute: 0.5 10*3/uL (ref 0.1–1.0)
Monocytes Relative: 8 %
Neutro Abs: 3.3 10*3/uL (ref 1.7–7.7)
Neutrophils Relative %: 51 %
Platelets: 220 10*3/uL (ref 150–400)
RBC: 4.61 MIL/uL (ref 4.22–5.81)
RDW: 13.1 % (ref 11.5–15.5)
WBC: 6.3 10*3/uL (ref 4.0–10.5)
nRBC: 0 % (ref 0.0–0.2)

## 2021-08-07 LAB — TROPONIN I (HIGH SENSITIVITY)
Troponin I (High Sensitivity): 5 ng/L (ref <18)
Troponin I (High Sensitivity): 6 ng/L (ref <18)

## 2021-08-07 LAB — COMPREHENSIVE METABOLIC PANEL
ALT: 22 U/L (ref 0–44)
AST: 29 U/L (ref 15–41)
Albumin: 3.9 g/dL (ref 3.5–5.0)
Alkaline Phosphatase: 75 U/L (ref 38–126)
Anion gap: 8 (ref 5–15)
BUN: 27 mg/dL — ABNORMAL HIGH (ref 8–23)
CO2: 25 mmol/L (ref 22–32)
Calcium: 9.1 mg/dL (ref 8.9–10.3)
Chloride: 105 mmol/L (ref 98–111)
Creatinine, Ser: 1.39 mg/dL — ABNORMAL HIGH (ref 0.61–1.24)
GFR, Estimated: 53 mL/min — ABNORMAL LOW (ref >60)
Glucose, Bld: 98 mg/dL (ref 70–99)
Potassium: 4.7 mmol/L (ref 3.5–5.1)
Sodium: 138 mmol/L (ref 135–145)
Total Bilirubin: 0.7 mg/dL (ref 0.3–1.2)
Total Protein: 6.2 g/dL — ABNORMAL LOW (ref 6.5–8.1)

## 2021-08-07 NOTE — ED Triage Notes (Signed)
Pt arrived POV from home c/o left sided numbness. Pt states he woke up around 2am and noticed his left hand and arm were numb. Pt states he had an angioplasty done 5/15 and has had some intermittent numbness in his left foot when walking but then hand and arm started early this morning. Pt denies any pain. Pt does not have any other neuro deficits.

## 2021-08-07 NOTE — ED Provider Notes (Signed)
[facility] EMERGENCY DEPARTMENT Provider Note   CSN: [id number] Arrival date & time: 08/07/21  4742     History  Chief Complaint  Patient presents with   Numbness    Ronald House is a 76 y.o. male.  Patient is a 76 year old male with PMH of CAD with recent balloon angioplasty (5/15) who presents today with numbness in his left foot as well as left hand.  Patient reports that after his discharge from the hospital from his heart catheterization he was encouraged to walk daily.  He started walking but noticed when he is walking up hills he would have numbness in his left foot.  It would resolve after the walk but return whenever he walked again.  This morning around 2 AM he woke up and he was having a similar "numbness" in his left hand.  He went back to sleep but woke up few hours later and if had gotten worse.  He reports that the reason he came in the emergency department before his recent heart catheterization was weakness in his legs and so he was just concerned and wanted to be evaluated.  He has been compliant with his aspirin and Plavix since discharge as well as his blood pressure medications.  Took his carvedilol this morning.  Takes sartan at night.  Patient denies any chest pain, shortness of breath, weakness, slurred speech, change in vision, headaches, nausea, vomiting.  He does acknowledge intermittent episodes of "chest pressure" which he states "feels like gas".  Patient says that since discharge he has been drinking considerably more water so he has been urinating more frequently.  He normally has to wake up 2 times to urinate throughout the night but is having to do it 3 times now.      Home Medications Prior to Admission medications   Medication Sig Start Date End Date Taking? Authorizing Provider  aspirin EC 81 MG EC tablet Take 1 tablet (81 mg total) by mouth daily. 01/14/13   Hongalgi, Lenis Dickinson, MD  atorvastatin (LIPITOR) 80 MG tablet TAKE 1  TABLET(80 MG) BY MOUTH DAILY AT 6 PM Patient taking differently: Take 80 mg by mouth every evening. 04/30/21   Adrian Prows, MD  carvedilol (COREG) 3.125 MG tablet TAKE 1 TABLET(3.125 MG) BY MOUTH TWICE DAILY WITH A MEAL Patient taking differently: Take 3.125 mg by mouth 2 (two) times daily with a meal. 04/18/21   Adrian Prows, MD  cholecalciferol (VITAMIN D3) 25 MCG (1000 UNIT) tablet Take 1,000 Units by mouth daily.    [provider]  clopidogrel (PLAVIX) 75 MG tablet Take 1 tablet (75 mg total) by mouth daily. 07/29/21   Adrian Prows, MD  lidocaine 4 % Place 1 patch onto the skin once a week.    [provider]  Multiple Vitamin (MULTIVITAMIN) tablet Take 1 tablet by mouth daily.    [provider]  nitroGLYCERIN (NITROSTAT) 0.4 MG SL tablet Place 1 tablet (0.4 mg total) under the tongue every 5 (five) minutes x 3 doses as needed for chest pain. 07/25/21   Adrian Prows, MD  olmesartan-hydrochlorothiazide (BENICAR HCT) 40-12.5 MG tablet TAKE 1 TABLET BY MOUTH DAILY Patient taking differently: Take 1 tablet by mouth every evening. 05/17/21   Adrian Prows, MD  Propylene Glycol (SYSTANE COMPLETE OP) Place 1 drop into both eyes daily as needed (dry eyes).    [provider]      Allergies    Patient has no known allergies.  Review of Systems   Review of Systems  Constitutional:  Negative for fatigue and fever.  HENT:  Negative for congestion and rhinorrhea.   Eyes:  Negative for visual disturbance.  Respiratory:  Negative for cough and shortness of breath.   Cardiovascular:  Negative for chest pain.  Gastrointestinal:  Negative for abdominal pain, constipation, diarrhea, nausea and vomiting.  Endocrine: Negative for polyuria.  Genitourinary:  Negative for decreased urine volume and dysuria.  Skin:  Negative for rash.  Neurological:  Negative for weakness and headaches.   Physical Exam Updated Vital Signs BP 105/69   Pulse (!) 44   Temp (!) 97.5 F (36.4 C)  (Oral)   Resp 15   Ht 5' 10.5" (1.791 m)   Wt 78.5 kg   SpO2 95%   BMI 24.47 kg/m  Physical Exam Vitals and nursing note reviewed.  Constitutional:      General: He is not in acute distress.    Appearance: Normal appearance. He is well-developed.  HENT:     Head: Normocephalic and atraumatic.     Right Ear: External ear normal.     Left Ear: External ear normal.     Nose: Nose normal.     Mouth/Throat:     Mouth: Mucous membranes are moist.  Eyes:     General: No scleral icterus.    Extraocular Movements: Extraocular movements intact.     Conjunctiva/sclera: Conjunctivae normal.     Pupils: Pupils are equal, round, and reactive to light.  Cardiovascular:     Rate and Rhythm: Regular rhythm. Bradycardia present.     Pulses: Normal pulses.     Heart sounds: No murmur heard. Pulmonary:     Effort: Pulmonary effort is normal. No respiratory distress.     Breath sounds: Normal breath sounds. No wheezing or rales.  Abdominal:     General: Abdomen is flat.     Palpations: Abdomen is soft.     Tenderness: There is no abdominal tenderness.  Musculoskeletal:        General: No swelling.     Cervical back: Neck supple.     Right lower leg: No edema.     Left lower leg: No edema.  Skin:    General: Skin is warm and dry.     Capillary Refill: Capillary refill takes less than 2 seconds.  Neurological:     General: No focal deficit present.     Mental Status: He is alert and oriented to person, place, and time.     Cranial Nerves: No cranial nerve deficit.     Sensory: Sensory deficit (Reporting numbness in left hand as well as left foot) present.  Psychiatric:        Mood and Affect: Mood normal.        Thought Content: Thought content normal.        Judgment: Judgment normal.    ED Results / Procedures / Treatments   Labs (all labs ordered are listed, but only abnormal results are displayed) Labs Reviewed  COMPREHENSIVE METABOLIC PANEL - Abnormal; Notable for the  following components:      Result Value   BUN 27 (*)    Creatinine, Ser 1.39 (*)    Total Protein 6.2 (*)    GFR, Estimated 53 (*)    All other components within normal limits  CBC WITH DIFFERENTIAL/PLATELET  TROPONIN I (HIGH SENSITIVITY)  TROPONIN I (HIGH SENSITIVITY)    EKG EKG Interpretation  Date/Time:  Wednesday Aug 07 2021  07:47:27 EDT Ventricular Rate:  53 PR Interval:  157 QRS Duration: 95 QT Interval:  424 QTC Calculation: 398 R Axis:   62 Text Interpretation: Sinus rhythm Confirmed by Madalyn Rob [phone]) on 08/07/2021 7:57:14 AM  Radiology No results found.  Procedures Procedures    Medications Ordered in ED Medications - No data to display  ED Course/ Medical Decision Making/ A&P                           Medical Decision Making 76 year old male with PMH of unstable angina, NSTEMI, CAD s/p balloon angioplasty, prostate cancer presenting today with numbness in left hand and left foot.  On arrival vital signs within normal limits.  EKG showing normal sinus rhythm.  Physical exam showing no weakness in his upper or lower extremities.  Pulses intact and extremities, no weakness, ambulates without difficulty.  Patient was recently hospitalized coming in with lower extremity weakness and found to have occlusion and stent which required his balloon angioplasty.  CMP, CBC, troponin, EKG is ordered.  CMP within normal limits with stable kidney function, CBC within normal limits, troponin 5.  Spoke with cardiology who reviewed chart and recommended close follow-up with their clinic which they would arrange.  On reevaluation patient still complaining of intermittent numbness in his hand and foot but no new symptoms.  Patient was discharged home with strict return precautions.  Amount and/or Complexity of Data Reviewed Labs: ordered.   Diagnoses Final diagnoses:  Paresthesia    Rx / DC Orders ED Discharge Orders     None         Gifford Shave,  MD 08/07/21 1142    Lucrezia Starch, MD 08/08/21 [phone]

## 2021-08-07 NOTE — Discharge Instructions (Signed)
It was great seeing you today.  I am sorry you are having these issues with the numbness in your hand and foot.  We collected lab work which all came back stable from your previous evaluations.  We also got an EKG of your heart which looked normal.  We checked labs to evaluate if there was any heart damage and those came back negative as well.  We talked to Dr. Einar Gip who will set up close follow-up in his clinic.  If your symptoms worsen, you have any shortness of breath or chest pain please be reevaluated.  I hope you have a great afternoon!

## 2021-08-09 ENCOUNTER — Ambulatory Visit: Payer: Medicare PPO | Admitting: Cardiology

## 2021-08-09 ENCOUNTER — Encounter: Payer: Self-pay | Admitting: Cardiology

## 2021-08-09 VITALS — BP 138/76 | HR 49 | Temp 98.0°F | Resp 17 | Ht 70.5 in | Wt 170.2 lb

## 2021-08-09 DIAGNOSIS — R202 Paresthesia of skin: Secondary | ICD-10-CM

## 2021-08-09 DIAGNOSIS — E7800 Pure hypercholesterolemia, unspecified: Secondary | ICD-10-CM | POA: Diagnosis not present

## 2021-08-09 DIAGNOSIS — E1169 Type 2 diabetes mellitus with other specified complication: Secondary | ICD-10-CM | POA: Diagnosis not present

## 2021-08-09 DIAGNOSIS — I10 Essential (primary) hypertension: Secondary | ICD-10-CM | POA: Diagnosis not present

## 2021-08-09 DIAGNOSIS — I200 Unstable angina: Secondary | ICD-10-CM

## 2021-08-09 DIAGNOSIS — I5042 Chronic combined systolic (congestive) and diastolic (congestive) heart failure: Secondary | ICD-10-CM | POA: Diagnosis not present

## 2021-08-09 DIAGNOSIS — I255 Ischemic cardiomyopathy: Secondary | ICD-10-CM | POA: Diagnosis not present

## 2021-08-09 DIAGNOSIS — I2510 Atherosclerotic heart disease of native coronary artery without angina pectoris: Secondary | ICD-10-CM | POA: Diagnosis not present

## 2021-08-09 DIAGNOSIS — I252 Old myocardial infarction: Secondary | ICD-10-CM | POA: Diagnosis not present

## 2021-08-09 NOTE — Progress Notes (Signed)
Primary Physician/Referring:  [name], MD  Patient ID: Ronald House, male    DOB: Aug 15, 1945, 76 y.o.   MRN: [id number]  Chief Complaint  Patient presents with   Hospitalization Follow-up    2 WEEKS   HPI:    Ronald House  is a 76 y.o. 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    Patient presented 07/27/2021 to the ED with complaints of chest pain and weakness.  Given classic anginal symptoms patient underwent cardiac catheterization which revealed late stent restenosis in the LCx for which she underwent balloon angioplasty.  Patient was subsequently discharged with aspirin and Plavix, atorvastatin, carvedilol, and olmesartan/HCTZ. Patient now presents for follow up. He is presently walking 1.25 miles daily. His primary concern is left foot numbness/tingling when walking as well as intermittent left arm numbness/tingling, particularly at night.  He is tolerating dual antiplatelet therapy without bleeding diathesis.  Past Medical History:  Diagnosis Date   Anxiety    Arteriosclerosis of both carotid arteries 06/02/2018   Arthritis    "lower back" (11/24/2014)   Bulging lumbar disc    Chronic lower back pain    Coronary artery disease    DDD (degenerative disc disease), lumbar    Dyspnea on exertion 06/02/2018   GERD (gastroesophageal reflux disease)    Hematuria    Peyronie disease    Prostate cancer ([place])    Undiagnosed cardiac murmurs    childhood/unknown if present issue   Past Surgical History:  Procedure Laterality Date   CARDIAC CATHETERIZATION N/A 11/24/2014   Procedure: Left Heart Cath and Coronary Angiography;  Surgeon: Adrian Prows, MD;  Location: [place] [facility];  Service: Cardiovascular;  Laterality: N/A;   CARDIAC CATHETERIZATION N/A 11/24/2014   Procedure: Coronary Stent Intervention;  Surgeon: Adrian Prows, MD;  Location: [place] [facility];  Service: Cardiovascular;  Laterality:  N/A;   CORONARY ANGIOPLASTY WITH STENT PLACEMENT  11/24/2014   "1 stent"   CORONARY BALLOON ANGIOPLASTY N/A 07/29/2021   Procedure: CORONARY BALLOON ANGIOPLASTY;  Surgeon: Adrian Prows, MD;  Location: [place] [facility];  Service: Cardiovascular;  Laterality: N/A;   FINGER FRACTURE SURGERY Right 1980's   index finger   FRACTURE SURGERY     LEFT HEART CATH AND CORONARY ANGIOGRAPHY N/A 07/29/2021   Procedure: LEFT HEART CATH AND CORONARY ANGIOGRAPHY;  Surgeon: Adrian Prows, MD;  Location: [place] [facility];  Service: Cardiovascular;  Laterality: N/A;   OPEN TREATMENT ZYGOMATIC ARCH FRACTURE  ~ [place]  <05/2011   RADIOACTIVE SEED IMPLANT  05/22/2011   Procedure: RADIOACTIVE SEED IMPLANT;  Surgeon: Ailene Rud, MD;  Location: [facility];  Service: Urology;  Laterality: N/A;  RAD TECH OK PER [facility]    TONSILLECTOMY AND ADENOIDECTOMY  50's   Family History  Problem Relation Age of Onset   Benign prostatic hyperplasia Father    Cancer Father        throat   Heart disease Brother    Colon cancer Neg Hx     Social History   Tobacco Use   Smoking status: Never   Smokeless tobacco: Never  Substance Use Topics   Alcohol use: Yes    Alcohol/week: 5.0 standard drinks    Types: 5 Glasses of wine per week    Comment: occasionally   Marital Status: Married   ROS  Review of Systems  Constitutional: Negative for  malaise/fatigue and weight gain.  Cardiovascular:  Positive for chest pain (improved). Negative for claudication, dyspnea on exertion, leg swelling, near-syncope, orthopnea, palpitations, paroxysmal nocturnal dyspnea and syncope.  Neurological:  Positive for numbness (left foot, left arm intermittently). Negative for dizziness.   Objective  Blood pressure 138/76, pulse (!) 49, temperature 98 F (36.7 C), temperature source Temporal, resp. rate 17, height 5' 10.5" (1.791 m), weight 170 lb 3.2 oz (77.2 kg), SpO2 98 %.     08/09/2021    2:02  PM 08/07/2021   11:30 AM 08/07/2021   11:00 AM  Vitals with BMI  Height 5' 10.5"    Weight 170 lbs 3 oz    BMI 11.94    Systolic 174 081 448  Diastolic 76 76 69  Pulse 49 50 44    Physical Exam Vitals reviewed.  Cardiovascular:     Rate and Rhythm: Normal rate and regular rhythm.     Pulses: Intact distal pulses.     Heart sounds: S1 normal and S2 normal. No murmur heard.   No gallop.     Comments: Radial access site well-healed without evidence of hematoma, ecchymosis, bruit. Pulmonary:     Effort: Pulmonary effort is normal. No respiratory distress.     Breath sounds: No wheezing, rhonchi or rales.  Musculoskeletal:     Right lower leg: No edema.     Left lower leg: No edema.  Neurological:     Mental Status: He is alert.    Laboratory examination:   Recent Labs    07/28/21 0118 07/29/21 0223 08/07/21 0800  NA 139 137 138  K 3.9 4.0 4.7  CL 105 105 105  CO2 '28 27 25  '$ GLUCOSE 104* 103* 98  BUN 25* 21 27*  CREATININE 1.22 1.33* 1.39*  CALCIUM 8.7* 8.6* 9.1  GFRNONAA >60 56* 53*   estimated creatinine clearance is 48.2 mL/min (A) (by C-G formula based on SCr of 1.39 mg/dL (H)).     Latest Ref Rng & Units 08/07/2021    8:00 AM 07/29/2021    2:23 AM 07/28/2021    1:18 AM  CMP  Glucose 70 - 99 mg/dL 98   103   104    BUN 8 - 23 mg/dL '27   21   25    '$ Creatinine 0.61 - 1.24 mg/dL 1.39   1.33   1.22    Sodium 135 - 145 mmol/L 138   137   139    Potassium 3.5 - 5.1 mmol/L 4.7   4.0   3.9    Chloride 98 - 111 mmol/L 105   105   105    CO2 22 - 32 mmol/L '25   27   28    '$ Calcium 8.9 - 10.3 mg/dL 9.1   8.6   8.7    Total Protein 6.5 - 8.1 g/dL 6.2      Total Bilirubin 0.3 - 1.2 mg/dL 0.7      Alkaline Phos 38 - 126 U/L 75      AST 15 - 41 U/L 29      ALT 0 - 44 U/L 22          Latest Ref Rng & Units 08/07/2021    8:00 AM 07/29/2021    2:23 AM 07/28/2021   10:42 AM  CBC  WBC 4.0 - 10.5 K/uL 6.3   7.8   6.6    Hemoglobin 13.0 - 17.0 g/dL 14.6   13.5   13.4  Hematocrit 39.0 - 52.0 % 43.4   41.0   41.6    Platelets 150 - 400 K/uL 220   186   187      Lipid Panel Recent Labs    07/29/21 0223  CHOL 125  TRIG 32  LDLCALC 55  VLDL 6  HDL 64  CHOLHDL 2.0  LDLDIRECT 44.9    HEMOGLOBIN A1C Lab Results  Component Value Date   HGBA1C 6.0 (H) 07/28/2021   MPG 125.5 07/28/2021   TSH Recent Labs    07/28/21 0118  TSH 1.212    External labs:  None  Allergies  No Known Allergies   Medications Prior to Visit:   Outpatient Medications Prior to Visit  Medication Sig Dispense Refill   aspirin EC 81 MG EC tablet Take 1 tablet (81 mg total) by mouth daily. 30 tablet 0   atorvastatin (LIPITOR) 80 MG tablet TAKE 1 TABLET(80 MG) BY MOUTH DAILY AT 6 PM (Patient taking differently: Take 80 mg by mouth every evening.) 90 tablet 1   carvedilol (COREG) 3.125 MG tablet TAKE 1 TABLET(3.125 MG) BY MOUTH TWICE DAILY WITH A MEAL (Patient taking differently: Take 3.125 mg by mouth 2 (two) times daily with a meal.) 180 tablet 1   cholecalciferol (VITAMIN D3) 25 MCG (1000 UNIT) tablet Take 1,000 Units by mouth daily.     clopidogrel (PLAVIX) 75 MG tablet Take 1 tablet (75 mg total) by mouth daily. 30 tablet 0   lidocaine 4 % Place 1 patch onto the skin once a week.     Multiple Vitamin (MULTIVITAMIN) tablet Take 1 tablet by mouth daily.     nitroGLYCERIN (NITROSTAT) 0.4 MG SL tablet Place 1 tablet (0.4 mg total) under the tongue every 5 (five) minutes x 3 doses as needed for chest pain. 25 tablet 12   olmesartan-hydrochlorothiazide (BENICAR HCT) 40-12.5 MG tablet TAKE 1 TABLET BY MOUTH DAILY (Patient taking differently: Take 1 tablet by mouth every evening.) 90 tablet 0   Propylene Glycol (SYSTANE COMPLETE OP) Place 1 drop into both eyes daily as needed (dry eyes).     No facility-administered medications prior to visit.   Final Medications at End of Visit    Current Meds  Medication Sig   aspirin EC 81 MG EC tablet Take 1 tablet (81 mg total) by  mouth daily.   atorvastatin (LIPITOR) 80 MG tablet TAKE 1 TABLET(80 MG) BY MOUTH DAILY AT 6 PM (Patient taking differently: Take 80 mg by mouth every evening.)   carvedilol (COREG) 3.125 MG tablet TAKE 1 TABLET(3.125 MG) BY MOUTH TWICE DAILY WITH A MEAL (Patient taking differently: Take 3.125 mg by mouth 2 (two) times daily with a meal.)   cholecalciferol (VITAMIN D3) 25 MCG (1000 UNIT) tablet Take 1,000 Units by mouth daily.   clopidogrel (PLAVIX) 75 MG tablet Take 1 tablet (75 mg total) by mouth daily.   lidocaine 4 % Place 1 patch onto the skin once a week.   Multiple Vitamin (MULTIVITAMIN) tablet Take 1 tablet by mouth daily.   nitroGLYCERIN (NITROSTAT) 0.4 MG SL tablet Place 1 tablet (0.4 mg total) under the tongue every 5 (five) minutes x 3 doses as needed for chest pain.   olmesartan-hydrochlorothiazide (BENICAR HCT) 40-12.5 MG tablet TAKE 1 TABLET BY MOUTH DAILY (Patient taking differently: Take 1 tablet by mouth every evening.)   Propylene Glycol (SYSTANE COMPLETE OP) Place 1 drop into both eyes daily as needed (dry eyes).   Radiology:   No results found.  DG Chest 2 View: 07/27/2021 The cardiomediastinal silhouette is unchanged and normal in contour.Atherosclerotic calcifications. No pleural effusion. No pneumothorax. Calcified granuloma of the LEFT apex. No acute pleuroparenchymal abnormality. Visualized abdomen is unremarkable. Mild degenerative changes of the thoracic spine.  IMPRESSION: No acute cardiopulmonary abnormality.   Cardiac Studies:   Echocardiogram 07/28/2021:  1. Left ventricular ejection fraction, by estimation, is 70 to 75%. The left ventricle has hyperdynamic function. The left ventricle has no regional wall motion abnormalities. There is mild asymmetric left ventricular hypertrophy of the basal-septal  segment. Left ventricular diastolic parameters are consistent with Grade I diastolic dysfunction (impaired relaxation).  2. Right ventricular systolic function is  normal. The right ventricular size is normal. There is normal pulmonary artery systolic pressure. The estimated right ventricular systolic pressure is 38.1 mmHg.  3. The mitral valve is normal in structure. No evidence of mitral valve regurgitation. No evidence of mitral stenosis.  4. The aortic valve is tricuspid. Aortic valve regurgitation is trivial.  5. The inferior vena cava is normal in size with greater than 50% respiratory variability, suggesting right atrial pressure of 3 mmHg.   Left Heart Catheterization 07/29/21:  LV: 128/-5, EDP 6 mmHg, AO 135/61, mean 90 mmHg.  No pressure gradient across the aortic valve.  LVEF 55 to 60% with no significant mitral regurgitation. RCA: Dominant, smooth and normal.  Compared to 2016, mid RCA stenosis of 50% no longer present. LM: Mildly calcified, no significant disease. LAD: Large vessel, gives origin to moderate-sized diagonals and proximal and mid segment, after the origin of D1, there is 10 to 15% mild luminal irregularity LCx: Very large vessel.  Gives origin to small OM1, large OM 2 after conversion to a small degree circumflex.  Mid segment of this circumflex has a 4.0 x 22 mm resolute DES placed on 11/24/2014, has in-stent 80% stenosis.  Mild stenosis is also evident in the distal end of the stent. Intervention data: Successful PTCA and balloon angioplasty with a 4.0 x 15 mm score flex balloon, entire stented segment and distal to the stent balloon angioplasty was performed, 0% distal stenosis and stent like results.  TIMI-3 flow was maintained.  Recommendation: Patient will need Plavix for at least 4 weeks since balloon angioplasty, aspirin indefinitely, patient has at least a 5-10 % chance of restenosis given excellent result and stent like results but if you have return of symptoms, we could certainly consider repeat stenting.  115 mL contrast utilized.  EKG:  08/09/2021: Sinus bradycardia rate of 50 bpm.  Normal axis.  No evidence of ischemia or  underlying injury pattern.  07/27/2021: Sinus bradycardia, 59 bpm, without underlying ischemia injury pattern.   Assessment     ICD-10-CM   1. Coronary artery disease involving native coronary artery of native heart without angina pectoris  I25.10 EKG 12-Lead    2. Unstable angina (HCC)  I20.0     3. Paresthesia of both feet  R20.2 PCV ANKLE BRACHIAL INDEX (ABI)    Ambulatory referral to Neurology    4. Paresthesia of left arm  R20.2 Ambulatory referral to Neurology       There are no discontinued medications.  No orders of the defined types were placed in this encounter.   Recommendations:   Ronald House is a 76 y.o. 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    Patient presented 07/27/2021 to the ED with complaints of chest pain and weakness.  Given classic anginal symptoms patient underwent cardiac catheterization which revealed late stent restenosis in the LCx for which she underwent balloon angioplasty.  Patient was subsequently discharged with aspirin and Plavix, atorvastatin, carvedilol, and olmesartan/HCTZ. Patient now presents for follow up.  Recommend continuation of dual antiplatelet therapy for 12 weeks, after which can discontinue Plavix and continue aspirin alone.  Blood pressure is well controlled.  EKG is unchanged compared to previous.  Given patient's symptoms of left leg numbness particular with exertion will obtain ABI.  He also has intermittent left arm numbness, will therefore refer to neurology for further evaluation.  Lipids are well controlled.  Continue present medications.  Follow-up in 4 to 6 weeks, sooner if needed.   Alethia Berthold, PA-C 08/09/2021, 3:31 PM Office: [phone]

## 2021-08-14 ENCOUNTER — Ambulatory Visit: Payer: Medicare PPO | Admitting: Cardiology

## 2021-08-16 DIAGNOSIS — H353132 Nonexudative age-related macular degeneration, bilateral, intermediate dry stage: Secondary | ICD-10-CM | POA: Diagnosis not present

## 2021-08-16 DIAGNOSIS — H43813 Vitreous degeneration, bilateral: Secondary | ICD-10-CM | POA: Diagnosis not present

## 2021-08-16 DIAGNOSIS — H35372 Puckering of macula, left eye: Secondary | ICD-10-CM | POA: Diagnosis not present

## 2021-08-16 DIAGNOSIS — H25813 Combined forms of age-related cataract, bilateral: Secondary | ICD-10-CM | POA: Diagnosis not present

## 2021-08-18 ENCOUNTER — Other Ambulatory Visit: Payer: Self-pay | Admitting: Cardiology

## 2021-08-18 DIAGNOSIS — I10 Essential (primary) hypertension: Secondary | ICD-10-CM

## 2021-08-21 DIAGNOSIS — Z8546 Personal history of malignant neoplasm of prostate: Secondary | ICD-10-CM | POA: Diagnosis not present

## 2021-08-22 ENCOUNTER — Ambulatory Visit: Payer: Medicare PPO

## 2021-08-22 DIAGNOSIS — I25110 Atherosclerotic heart disease of native coronary artery with unstable angina pectoris: Secondary | ICD-10-CM | POA: Diagnosis not present

## 2021-08-22 DIAGNOSIS — R202 Paresthesia of skin: Secondary | ICD-10-CM

## 2021-08-29 DIAGNOSIS — R351 Nocturia: Secondary | ICD-10-CM | POA: Diagnosis not present

## 2021-08-29 DIAGNOSIS — Z8546 Personal history of malignant neoplasm of prostate: Secondary | ICD-10-CM | POA: Diagnosis not present

## 2021-08-29 DIAGNOSIS — N401 Enlarged prostate with lower urinary tract symptoms: Secondary | ICD-10-CM | POA: Diagnosis not present

## 2021-09-09 ENCOUNTER — Ambulatory Visit: Payer: Medicare PPO | Admitting: Neurology

## 2021-09-09 ENCOUNTER — Encounter: Payer: Self-pay | Admitting: Neurology

## 2021-09-09 VITALS — BP 132/68 | HR 53 | Ht 70.5 in | Wt 170.0 lb

## 2021-09-09 DIAGNOSIS — M5416 Radiculopathy, lumbar region: Secondary | ICD-10-CM

## 2021-09-09 DIAGNOSIS — R200 Anesthesia of skin: Secondary | ICD-10-CM | POA: Diagnosis not present

## 2021-09-09 DIAGNOSIS — M720 Palmar fascial fibromatosis [Dupuytren]: Secondary | ICD-10-CM | POA: Insufficient documentation

## 2021-09-09 DIAGNOSIS — R7303 Prediabetes: Secondary | ICD-10-CM | POA: Diagnosis not present

## 2021-09-12 ENCOUNTER — Encounter: Payer: Self-pay | Admitting: Cardiology

## 2021-09-12 ENCOUNTER — Ambulatory Visit: Payer: Medicare PPO | Admitting: Cardiology

## 2021-09-12 VITALS — BP 134/81 | HR 54 | Temp 98.0°F | Resp 16 | Ht 70.0 in | Wt 168.4 lb

## 2021-09-12 DIAGNOSIS — E7800 Pure hypercholesterolemia, unspecified: Secondary | ICD-10-CM | POA: Diagnosis not present

## 2021-09-12 DIAGNOSIS — I2510 Atherosclerotic heart disease of native coronary artery without angina pectoris: Secondary | ICD-10-CM | POA: Diagnosis not present

## 2021-09-12 DIAGNOSIS — I10 Essential (primary) hypertension: Secondary | ICD-10-CM | POA: Diagnosis not present

## 2021-09-12 MED ORDER — OLMESARTAN MEDOXOMIL 40 MG PO TABS: 40.0000 mg | ORAL_TABLET | Freq: Every evening | ORAL | 3 refills | Status: DC

## 2021-09-12 NOTE — Progress Notes (Signed)
Primary Physician/Referring:  [name], MD  Patient ID: Geroge House, male    DOB: [birth year], 76 y.o.   MRN: [id number]  Chief Complaint  Patient presents with   Coronary Artery Disease   Follow-up    4 weeks   Results    4 weeks    HPI:    Ronald House  is a 76 y.o. 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    Patient presented 07/27/2021 to the ED with complaints of chest pain and weakness.  Given classic anginal symptoms patient underwent cardiac catheterization which revealed late stent restenosis in the LCx for which he underwent balloon angioplasty.  He now presents for a 6-week office visit, he has not had any recurrence of angina pectoris, overall feeling well.  He has been walking at least for 30 minutes briskly every day.  Remains asymptomatic.  Past Medical History:  Diagnosis Date   Anxiety    Arteriosclerosis of both carotid arteries 06/02/2018   Arthritis    "lower back" (11/24/2014)   Bulging lumbar disc    Chronic lower back pain    Coronary artery disease    DDD (degenerative disc disease), lumbar    Dyspnea on exertion 06/02/2018   GERD (gastroesophageal reflux disease)    Hematuria    Peyronie disease    Prostate cancer ([place])    Undiagnosed cardiac murmurs    childhood/unknown if present issue   Past Surgical History:  Procedure Laterality Date   CARDIAC CATHETERIZATION N/A 11/24/2014   Procedure: Left Heart Cath and Coronary Angiography;  Surgeon: Adrian Prows, MD;  Location: [place] [facility];  Service: Cardiovascular;  Laterality: N/A;   CARDIAC CATHETERIZATION N/A 11/24/2014   Procedure: Coronary Stent Intervention;  Surgeon: Adrian Prows, MD;  Location: [place] [facility];  Service: Cardiovascular;  Laterality: N/A;   CORONARY ANGIOPLASTY WITH STENT PLACEMENT  11/24/2014   "1 stent"   CORONARY BALLOON ANGIOPLASTY N/A 07/29/2021   Procedure: CORONARY BALLOON  ANGIOPLASTY;  Surgeon: Adrian Prows, MD;  Location: [place] [facility];  Service: Cardiovascular;  Laterality: N/A;   FINGER FRACTURE SURGERY Right 1980's   index finger   FRACTURE SURGERY     LEFT HEART CATH AND CORONARY ANGIOGRAPHY N/A 07/29/2021   Procedure: LEFT HEART CATH AND CORONARY ANGIOGRAPHY;  Surgeon: Adrian Prows, MD;  Location: [place] [facility];  Service: Cardiovascular;  Laterality: N/A;   OPEN TREATMENT ZYGOMATIC ARCH FRACTURE  ~ [place]  <05/2011   RADIOACTIVE SEED IMPLANT  05/22/2011   Procedure: RADIOACTIVE SEED IMPLANT;  Surgeon: Ailene Rud, MD;  Location: [facility];  Service: Urology;  Laterality: N/A;  RAD TECH OK PER [facility]    TONSILLECTOMY AND ADENOIDECTOMY  43's   Family History  Problem Relation Age of Onset   Benign prostatic hyperplasia Father    Cancer Father        throat   Heart disease Brother    Colon cancer Neg Hx     Social History   Tobacco Use   Smoking status: Never   Smokeless tobacco: Never  Substance Use Topics   Alcohol use: Yes    Alcohol/week: 5.0 standard drinks of alcohol    Types: 5 Glasses of wine per week    Comment: occasionally   Marital Status: Married   ROS  Review of Systems  Cardiovascular:  Negative for chest pain,  dyspnea on exertion and leg swelling.    Objective  Blood pressure 134/81, pulse (!) 54, temperature 98 F (36.7 C), temperature source Temporal, resp. rate 16, height '5\' 10"'$  (1.778 m), weight 168 lb 6.4 oz (76.4 kg).     09/12/2021   11:18 AM 09/09/2021   10:27 AM 08/09/2021    2:02 PM  Vitals with BMI  Height '5\' 10"'$  5' 10.5" 5' 10.5"  Weight 168 lbs 6 oz 170 lbs 170 lbs 3 oz  BMI 24.16 89.21 19.41  Systolic 740 814 481  Diastolic 81 68 76  Pulse 54 53 49    Physical Exam Vitals reviewed.  Neck:     Vascular: No carotid bruit or JVD.  Cardiovascular:     Rate and Rhythm: Normal rate and regular rhythm.     Pulses: Intact distal pulses.     Heart  sounds: S1 normal and S2 normal. Murmur heard.     Systolic murmur is present with a grade of 2/6 at the upper right sternal border radiating to the apex.     No gallop.  Pulmonary:     Effort: Pulmonary effort is normal. No respiratory distress.     Breath sounds: No wheezing, rhonchi or rales.  Abdominal:     General: Bowel sounds are normal.     Palpations: Abdomen is soft.  Musculoskeletal:     Right lower leg: No edema.     Left lower leg: No edema.     Laboratory examination:   Recent Labs    07/28/21 0118 07/29/21 0223 08/07/21 0800  NA 139 137 138  K 3.9 4.0 4.7  CL 105 105 105  CO2 '28 27 25  '$ GLUCOSE 104* 103* 98  BUN 25* 21 27*  CREATININE 1.22 1.33* 1.39*  CALCIUM 8.7* 8.6* 9.1  GFRNONAA >60 56* 53*   CrCl cannot be calculated (Patient's most recent lab result is older than the maximum 21 days allowed.).     Latest Ref Rng & Units 08/07/2021    8:00 AM 07/29/2021    2:23 AM 07/28/2021    1:18 AM  CMP  Glucose 70 - 99 mg/dL 98  103  104   BUN 8 - 23 mg/dL '27  21  25   '$ Creatinine 0.61 - 1.24 mg/dL 1.39  1.33  1.22   Sodium 135 - 145 mmol/L 138  137  139   Potassium 3.5 - 5.1 mmol/L 4.7  4.0  3.9   Chloride 98 - 111 mmol/L 105  105  105   CO2 22 - 32 mmol/L '25  27  28   '$ Calcium 8.9 - 10.3 mg/dL 9.1  8.6  8.7   Total Protein 6.5 - 8.1 g/dL 6.2     Total Bilirubin 0.3 - 1.2 mg/dL 0.7     Alkaline Phos 38 - 126 U/L 75     AST 15 - 41 U/L 29     ALT 0 - 44 U/L 22         Latest Ref Rng & Units 08/07/2021    8:00 AM 07/29/2021    2:23 AM 07/28/2021   10:42 AM  CBC  WBC 4.0 - 10.5 K/uL 6.3  7.8  6.6   Hemoglobin 13.0 - 17.0 g/dL 14.6  13.5  13.4   Hematocrit 39.0 - 52.0 % 43.4  41.0  41.6   Platelets 150 - 400 K/uL 220  186  187     Lipid Panel Recent Labs  07/29/21 0223  CHOL 125  TRIG 32  LDLCALC 55  VLDL 6  HDL 64  CHOLHDL 2.0  LDLDIRECT 44.9    HEMOGLOBIN A1C Lab Results  Component Value Date   HGBA1C 6.0 (H) 07/28/2021   MPG 125.5  07/28/2021   TSH Recent Labs    07/28/21 0118  TSH 1.212    External labs:  None  Allergies  No Known Allergies   Final Medications at End of Visit    Current Outpatient Medications:    aspirin EC 81 MG EC tablet, Take 1 tablet (81 mg total) by mouth daily., Disp: 30 tablet, Rfl: 0   atorvastatin (LIPITOR) 80 MG tablet, TAKE 1 TABLET(80 MG) BY MOUTH DAILY AT 6 PM (Patient taking differently: Take 80 mg by mouth every evening.), Disp: 90 tablet, Rfl: 1   carvedilol (COREG) 3.125 MG tablet, Take 3.125 mg by mouth 2 (two) times daily with a meal., Disp: , Rfl:    cholecalciferol (VITAMIN D3) 25 MCG (1000 UNIT) tablet, Take 1,000 Units by mouth daily., Disp: , Rfl:    lidocaine 4 %, Place 1 patch onto the skin once a week., Disp: , Rfl:    Multiple Vitamin (MULTIVITAMIN) tablet, Take 1 tablet by mouth daily., Disp: , Rfl:    nitroGLYCERIN (NITROSTAT) 0.4 MG SL tablet, Place 1 tablet (0.4 mg total) under the tongue every 5 (five) minutes x 3 doses as needed for chest pain., Disp: 25 tablet, Rfl: 12   olmesartan (BENICAR) 40 MG tablet, Take 1 tablet (40 mg total) by mouth every evening., Disp: 90 tablet, Rfl: 3   Propylene Glycol (SYSTANE COMPLETE OP), Place 1 drop into both eyes daily as needed (dry eyes)., Disp: , Rfl:    Radiology:   No results found.  DG Chest 2 View: 07/27/2021 The cardiomediastinal silhouette is unchanged and normal in contour.Atherosclerotic calcifications. No pleural effusion. No pneumothorax. Calcified granuloma of the LEFT apex. No acute pleuroparenchymal abnormality. Visualized abdomen is unremarkable. Mild degenerative changes of the thoracic spine.  IMPRESSION: No acute cardiopulmonary abnormality.   Cardiac Studies:   Echocardiogram 07/28/2021:  1. Left ventricular ejection fraction, by estimation, is 70 to 75%. The left ventricle has hyperdynamic function. The left ventricle has no regional wall motion abnormalities. There is mild asymmetric left  ventricular hypertrophy of the basal-septal  segment. Left ventricular diastolic parameters are consistent with Grade I diastolic dysfunction (impaired relaxation).  2. Right ventricular systolic function is normal. The right ventricular size is normal. There is normal pulmonary artery systolic pressure. The estimated right ventricular systolic pressure is 50.9 mmHg.  3. The mitral valve is normal in structure. No evidence of mitral valve regurgitation. No evidence of mitral stenosis.  4. The aortic valve is tricuspid. Aortic valve regurgitation is trivial.  5. The inferior vena cava is normal in size with greater than 50% respiratory variability, suggesting right atrial pressure of 3 mmHg.   Left Heart Catheterization 07/29/21:  LV: 128/-5, EDP 6 mmHg, AO 135/61, mean 90 mmHg.  No pressure gradient across the aortic valve.  LVEF 55 to 60% with no significant mitral regurgitation. RCA: Dominant, smooth and normal.  Compared to 2016, mid RCA stenosis of 50% no longer present. LM: Mildly calcified, no significant disease. LAD: Large vessel, gives origin to moderate-sized diagonals and proximal and mid segment, after the origin of D1, there is 10 to 15% mild luminal irregularity LCx: Very large vessel.  Gives origin to small OM1, large OM 2 after conversion to a small  degree circumflex.  Mid segment of this circumflex has a 4.0 x 22 mm resolute DES placed on 11/24/2014, has in-stent 80% stenosis.  Mild stenosis is also evident in the distal end of the stent. Intervention data: Successful PTCA and balloon angioplasty with a 4.0 x 15 mm score flex balloon, entire stented segment and distal to the stent balloon angioplasty was performed, 0% distal stenosis and stent like results.  TIMI-3 flow was maintained.  Recommendation: Patient will need Plavix for at least 4 weeks since balloon angioplasty, aspirin indefinitely, patient has at least a 5-10 % chance of restenosis given excellent result and stent like  results but if you have return of symptoms, we could certainly consider repeat stenting.  115 mL contrast utilized.    ABI 08/22/2021: This exam reveals normal perfusion of the right lower extremity (ABI 1.46).   This exam reveals normal perfusion of the left lower extremity (ABI 1.61).  Normal triphasic waveform pattern at the ankles.  EKG:  08/09/2021: Sinus bradycardia rate of 50 bpm.  Normal axis.  No evidence of ischemia or underlying injury pattern.  07/27/2021: Sinus bradycardia, 59 bpm, without underlying ischemia injury pattern.   Assessment     ICD-10-CM   1. Coronary artery disease involving native coronary artery of native heart without angina pectoris  I25.10 PCV CARDIAC STRESS TEST    2. Primary hypertension  I10 olmesartan (BENICAR) 40 MG tablet    3. Pure hypercholesterolemia  E78.00        Medications Discontinued During This Encounter  Medication Reason   clopidogrel (PLAVIX) 75 MG tablet    olmesartan-hydrochlorothiazide (BENICAR HCT) 40-12.5 MG tablet Side effect (s)    Meds ordered this encounter  Medications   olmesartan (BENICAR) 40 MG tablet    Sig: Take 1 tablet (40 mg total) by mouth every evening.    Dispense:  90 tablet    Refill:  3    Recommendations:   Ronald House is a 76 y.o. 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    Patient presented 07/27/2021 to the ED with complaints of chest pain and weakness.  Given classic anginal symptoms patient underwent cardiac catheterization which revealed late stent restenosis in the LCx for which he underwent balloon angioplasty.  He now presents for a 6-week office visit, he has not had any recurrence of angina pectoris, overall feeling well.  ABI reviewed, normal.  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  Patient presently wants to continue medical  therapy for the same 95 to reassure.  He has been having dry mouth and dry lips and chapped lips, will discontinue olmesartan HCT and switch him to plain olmesartan.  This could be due to excess diuresis but also could be from photosensitivity from hydrochlorothiazide.  He is now on aspirin alone, continue the same.  Lipids are at goal and is tolerating high intensity statins and high-dose statin with Lipitor 80 mg daily.  In view of risk of restenosis of the circumflex stent, I will set him up for a routine treadmill exercise stress test.  Otherwise unless abnormal, I will see him back in 6 months for follow-up.   Adrian Prows, PA-C 09/12/2021, 1:13 PM Office: [phone]

## 2021-09-25 DIAGNOSIS — I5032 Chronic diastolic (congestive) heart failure: Secondary | ICD-10-CM | POA: Diagnosis not present

## 2021-09-25 DIAGNOSIS — E782 Mixed hyperlipidemia: Secondary | ICD-10-CM | POA: Diagnosis not present

## 2021-09-25 DIAGNOSIS — I129 Hypertensive chronic kidney disease with stage 1 through stage 4 chronic kidney disease, or unspecified chronic kidney disease: Secondary | ICD-10-CM | POA: Diagnosis not present

## 2021-09-25 DIAGNOSIS — N182 Chronic kidney disease, stage 2 (mild): Secondary | ICD-10-CM | POA: Diagnosis not present

## 2021-10-02 DIAGNOSIS — I2510 Atherosclerotic heart disease of native coronary artery without angina pectoris: Secondary | ICD-10-CM | POA: Diagnosis not present

## 2021-10-02 DIAGNOSIS — E782 Mixed hyperlipidemia: Secondary | ICD-10-CM | POA: Diagnosis not present

## 2021-10-02 DIAGNOSIS — Z23 Encounter for immunization: Secondary | ICD-10-CM | POA: Diagnosis not present

## 2021-10-02 DIAGNOSIS — I5032 Chronic diastolic (congestive) heart failure: Secondary | ICD-10-CM | POA: Diagnosis not present

## 2021-10-02 DIAGNOSIS — I129 Hypertensive chronic kidney disease with stage 1 through stage 4 chronic kidney disease, or unspecified chronic kidney disease: Secondary | ICD-10-CM | POA: Diagnosis not present

## 2021-10-02 DIAGNOSIS — N182 Chronic kidney disease, stage 2 (mild): Secondary | ICD-10-CM | POA: Diagnosis not present

## 2021-10-15 ENCOUNTER — Other Ambulatory Visit: Payer: Self-pay | Admitting: Cardiology

## 2021-10-19 ENCOUNTER — Other Ambulatory Visit: Payer: Self-pay | Admitting: Cardiology

## 2021-11-01 ENCOUNTER — Ambulatory Visit: Payer: Medicare PPO

## 2021-11-01 DIAGNOSIS — I2510 Atherosclerotic heart disease of native coronary artery without angina pectoris: Secondary | ICD-10-CM | POA: Diagnosis not present

## 2021-11-19 ENCOUNTER — Other Ambulatory Visit: Payer: Self-pay | Admitting: Cardiology

## 2021-11-19 DIAGNOSIS — I10 Essential (primary) hypertension: Secondary | ICD-10-CM

## 2021-12-04 DIAGNOSIS — R0981 Nasal congestion: Secondary | ICD-10-CM | POA: Diagnosis not present

## 2021-12-04 DIAGNOSIS — H938X2 Other specified disorders of left ear: Secondary | ICD-10-CM | POA: Diagnosis not present

## 2021-12-04 DIAGNOSIS — R0989 Other specified symptoms and signs involving the circulatory and respiratory systems: Secondary | ICD-10-CM | POA: Diagnosis not present

## 2022-02-21 DIAGNOSIS — H25813 Combined forms of age-related cataract, bilateral: Secondary | ICD-10-CM | POA: Diagnosis not present

## 2022-02-21 DIAGNOSIS — H353132 Nonexudative age-related macular degeneration, bilateral, intermediate dry stage: Secondary | ICD-10-CM | POA: Diagnosis not present

## 2022-02-21 DIAGNOSIS — H35372 Puckering of macula, left eye: Secondary | ICD-10-CM | POA: Diagnosis not present

## 2022-02-21 DIAGNOSIS — H43813 Vitreous degeneration, bilateral: Secondary | ICD-10-CM | POA: Diagnosis not present

## 2022-03-18 NOTE — Telephone Encounter (Signed)
From patient.

## 2022-03-19 NOTE — Progress Notes (Unsigned)
Primary Physician/Referring:  [name], MD  Patient ID: Ronald House, male    DOB: 15-Jul-1945, 77 y.o.   MRN: [id number]  No chief complaint on file.  HPI:    Ronald House  is a 77 y.o. 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    Patient presented 07/27/2021 to the ED with complaints of chest pain and weakness.  Given classic anginal symptoms patient underwent cardiac catheterization which revealed late stent restenosis in the LCx for which he underwent balloon angioplasty.  This is a 78-monthoffice visit follow-up. He has not had any recurrence of angina pectoris, overall feeling well.  He has been walking at least for 30 minutes briskly every day.  Remains asymptomatic.  Past Medical History:  Diagnosis Date   Anxiety    Arteriosclerosis of both carotid arteries 06/02/2018   Arthritis    "lower back" (11/24/2014)   Bulging lumbar disc    Chronic lower back pain    Coronary artery disease    DDD (degenerative disc disease), lumbar    Dyspnea on exertion 06/02/2018   GERD (gastroesophageal reflux disease)    Hematuria    Peyronie disease    [place]    Undiagnosed cardiac murmurs    childhood/unknown if present issue   Past Surgical History:  Procedure Laterality Date   CARDIAC CATHETERIZATION N/A 11/24/2014   Procedure: Left Heart Cath and Coronary Angiography;  Surgeon: JAdrian Prows MD;  [facility];  Service: Cardiovascular;  Laterality: N/A;   CARDIAC CATHETERIZATION N/A 11/24/2014   Procedure: Coronary Stent Intervention;  Surgeon: JAdrian Prows MD;  [facility];  Service: Cardiovascular;  Laterality: N/A;   CORONARY ANGIOPLASTY WITH STENT PLACEMENT  11/24/2014   "1 stent"   CORONARY BALLOON ANGIOPLASTY N/A 07/29/2021   Procedure: CORONARY BALLOON ANGIOPLASTY;  Surgeon: GAdrian Prows MD;  [facility];  Service: Cardiovascular;   Laterality: N/A;   FINGER FRACTURE SURGERY Right 1980's   index finger   FRACTURE SURGERY     LEFT HEART CATH AND CORONARY ANGIOGRAPHY N/A 07/29/2021   Procedure: LEFT HEART CATH AND CORONARY ANGIOGRAPHY;  Surgeon: GAdrian Prows MD;  [facility];  Service: Cardiovascular;  Laterality: N/A;   OPEN TREATMENT ZYGOMATIC ARCH FRACTURE  ~ [place] <05/2011   RADIOACTIVE SEED IMPLANT  05/22/2011   Procedure: RADIOACTIVE SEED IMPLANT;  Surgeon: SAilene Rud MD;  [facility]  Service: Urology;  Laterality: N/A;  RAD TECH OK PER [facility]    TONSILLECTOMY AND ADENOIDECTOMY  130's  Family History  Problem Relation Age of Onset   Benign prostatic hyperplasia Father    Cancer Father        throat   Heart disease Brother    Colon cancer Neg Hx     Social History   Tobacco Use   Smoking status: Never   Smokeless tobacco: Never  Substance Use Topics   Alcohol use: Yes    Alcohol/week: 5.0 standard drinks of alcohol    Types: 5 Glasses of wine per week    Comment: occasionally   Marital Status: Married   ROS  Review of Systems  Cardiovascular:  Negative for chest pain, dyspnea on exertion and leg swelling.    Objective  There were no vitals taken for this visit.     09/12/2021  11:18 AM 09/09/2021   10:27 AM 08/09/2021    2:02 PM  Vitals with BMI  Height '5\' 10"'$  5' 10.5" 5' 10.5"  Weight 168 lbs 6 oz 170 lbs 170 lbs 3 oz  BMI 24.16 16.96 78.93  Systolic 810 175 102  Diastolic 81 68 76  Pulse 54 53 49    Physical Exam Vitals reviewed.  Neck:     Vascular: No carotid bruit or JVD.  Cardiovascular:     Rate and Rhythm: Normal rate and regular rhythm.     Pulses: Intact distal pulses.     Heart sounds: S1 normal and S2 normal. Murmur heard.     Systolic murmur is present with a grade of 2/6 at the upper right sternal border radiating to the apex.     No gallop.  Pulmonary:     Effort: Pulmonary effort is  normal. No respiratory distress.     Breath sounds: No wheezing, rhonchi or rales.  Abdominal:     General: Bowel sounds are normal.     Palpations: Abdomen is soft.  Musculoskeletal:     Right lower leg: No edema.     Left lower leg: No edema.     Laboratory examination:   Recent Labs    07/28/21 0118 07/29/21 0223 08/07/21 0800  NA 139 137 138  K 3.9 4.0 4.7  CL 105 105 105  CO2 '28 27 25  '$ GLUCOSE 104* 103* 98  BUN 25* 21 27*  CREATININE 1.22 1.33* 1.39*  CALCIUM 8.7* 8.6* 9.1  GFRNONAA >60 56* 53*       Latest Ref Rng & Units 08/07/2021    8:00 AM 07/29/2021    2:23 AM 07/28/2021    1:18 AM  CMP  Glucose 70 - 99 mg/dL 98  103  104   BUN 8 - 23 mg/dL '27  21  25   '$ Creatinine 0.61 - 1.24 mg/dL 1.39  1.33  1.22   Sodium 135 - 145 mmol/L 138  137  139   Potassium 3.5 - 5.1 mmol/L 4.7  4.0  3.9   Chloride 98 - 111 mmol/L 105  105  105   CO2 22 - 32 mmol/L '25  27  28   '$ Calcium 8.9 - 10.3 mg/dL 9.1  8.6  8.7   Total Protein 6.5 - 8.1 g/dL 6.2     Total Bilirubin 0.3 - 1.2 mg/dL 0.7     Alkaline Phos 38 - 126 U/L 75     AST 15 - 41 U/L 29     ALT 0 - 44 U/L 22         Latest Ref Rng & Units 08/07/2021    8:00 AM 07/29/2021    2:23 AM 07/28/2021   10:42 AM  CBC  WBC 4.0 - 10.5 K/uL 6.3  7.8  6.6   Hemoglobin 13.0 - 17.0 g/dL 14.6  13.5  13.4   Hematocrit 39.0 - 52.0 % 43.4  41.0  41.6   Platelets 150 - 400 K/uL 220  186  187    Lipid Panel Recent Labs    07/29/21 0223  CHOL 125  TRIG 32  LDLCALC 55  VLDL 6  HDL 64  CHOLHDL 2.0  LDLDIRECT 44.9    HEMOGLOBIN A1C Lab Results  Component Value Date   HGBA1C 6.0 (H) 07/28/2021   MPG 125.5 07/28/2021   TSH Recent Labs    07/28/21 0118  TSH 1.212   External labs:  None  Allergies  No Known Allergies   Final Medications at End of Visit    Current Outpatient Medications:    aspirin EC 81 MG EC tablet, Take 1 tablet (81 mg total) by mouth daily., Disp: 30 tablet, Rfl: 0   atorvastatin (LIPITOR) 80  MG tablet, TAKE 1 TABLET(80 MG) BY MOUTH DAILY AT 6 PM, Disp: 90 tablet, Rfl: 1   carvedilol (COREG) 3.125 MG tablet, TAKE 1 TABLET(3.125 MG) BY MOUTH TWICE DAILY WITH A MEAL, Disp: 180 tablet, Rfl: 3   cholecalciferol (VITAMIN D3) 25 MCG (1000 UNIT) tablet, Take 1,000 Units by mouth daily., Disp: , Rfl:    lidocaine 4 %, Place 1 patch onto the skin once a week., Disp: , Rfl:    Multiple Vitamin (MULTIVITAMIN) tablet, Take 1 tablet by mouth daily., Disp: , Rfl:    nitroGLYCERIN (NITROSTAT) 0.4 MG SL tablet, Place 1 tablet (0.4 mg total) under the tongue every 5 (five) minutes x 3 doses as needed for chest pain., Disp: 25 tablet, Rfl: 12   olmesartan (BENICAR) 40 MG tablet, Take 1 tablet (40 mg total) by mouth every evening., Disp: 90 tablet, Rfl: 3   Propylene Glycol (SYSTANE COMPLETE OP), Place 1 drop into both eyes daily as needed (dry eyes)., Disp: , Rfl:    Radiology:   No results found.  DG Chest 2 View: 07/27/2021 The cardiomediastinal silhouette is unchanged and normal in contour.Atherosclerotic calcifications. No pleural effusion. No pneumothorax. Calcified granuloma of the LEFT apex. No acute pleuroparenchymal abnormality. Visualized abdomen is unremarkable. Mild degenerative changes of the thoracic spine.  IMPRESSION: No acute cardiopulmonary abnormality.   Cardiac Studies:   Echocardiogram 07/28/2021:  1. Left ventricular ejection fraction, by estimation, is 70 to 75%. The left ventricle has hyperdynamic function. The left ventricle has no regional wall motion abnormalities. There is mild asymmetric left ventricular hypertrophy of the basal-septal  segment. Left ventricular diastolic parameters are consistent with Grade I diastolic dysfunction (impaired relaxation).  2. Right ventricular systolic function is normal. The right ventricular size is normal. There is normal pulmonary artery systolic pressure. The estimated right ventricular systolic pressure is 09.8 mmHg.  3. The mitral  valve is normal in structure. No evidence of mitral valve regurgitation. No evidence of mitral stenosis.  4. The aortic valve is tricuspid. Aortic valve regurgitation is trivial.  5. The inferior vena cava is normal in size with greater than 50% respiratory variability, suggesting right atrial pressure of 3 mmHg.   Left Heart Catheterization 07/29/21:  LV: 128/-5, EDP 6 mmHg, AO 135/61, mean 90 mmHg.  No pressure gradient across the aortic valve.  LVEF 55 to 60% with no significant mitral regurgitation. RCA: Dominant, smooth and normal.  Compared to 2016, mid RCA stenosis of 50% no longer present. LM: Mildly calcified, no significant disease. LAD: Large vessel, gives origin to moderate-sized diagonals and proximal and mid segment, after the origin of D1, there is 10 to 15% mild luminal irregularity LCx: Very large vessel.  Gives origin to small OM1, large OM 2 after conversion to a small degree circumflex.  Mid segment of this circumflex has a 4.0 x 22 mm resolute DES placed on 11/24/2014, has in-stent 80% stenosis.  Mild stenosis is also evident in the distal end of the stent. Intervention data: Successful PTCA and balloon angioplasty with a 4.0 x 15 mm score flex balloon, entire stented segment and distal to the stent balloon angioplasty was performed, 0% distal stenosis and stent like results.  TIMI-3 flow was maintained.  Recommendation: Patient will need Plavix for at least 4 weeks since balloon angioplasty, aspirin indefinitely, patient has at least a 5-10 % chance of restenosis given excellent result and stent like results but if you have return of symptoms, we could certainly consider repeat stenting.  115 mL contrast utilized.    ABI 08/22/2021: This exam reveals normal perfusion of the right lower extremity (ABI 1.46).   This exam reveals normal perfusion of the left lower extremity (ABI 1.61).  Normal triphasic waveform pattern at the ankles.  EKG:  08/09/2021: Sinus bradycardia rate of  50 bpm.  Normal axis.  No evidence of ischemia or underlying injury pattern.  07/27/2021: Sinus bradycardia, 59 bpm, without underlying ischemia injury pattern.   Assessment     ICD-10-CM   1. Coronary artery disease involving native coronary artery of native heart without angina pectoris  I25.10     2. Primary hypertension  I10     3. Pure hypercholesterolemia  E78.00        There are no discontinued medications.   No orders of the defined types were placed in this encounter.   Recommendations:   Ronald House is a 77 y.o. 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    Patient presented 07/27/2021 to the ED with complaints of chest pain and weakness.  Given classic anginal symptoms patient underwent cardiac catheterization which revealed late stent restenosis in the LCx for which he underwent balloon angioplasty.  This is a 35-monthoffice visit.  1. Coronary artery disease involving native coronary artery of native heart without angina pectoris ***  2. Primary hypertension ***  3. Pure hypercholesterolemia ***     JAdrian Prows MD, F[facility]1/05/2022, 9:52 PM Office: [phone]: [phone]: [phone]

## 2022-03-20 ENCOUNTER — Ambulatory Visit: Payer: Medicare PPO | Admitting: Cardiology

## 2022-03-20 ENCOUNTER — Encounter: Payer: Self-pay | Admitting: Cardiology

## 2022-03-20 VITALS — BP 130/70 | HR 58 | Resp 16 | Ht 70.0 in | Wt 170.0 lb

## 2022-03-20 DIAGNOSIS — I2510 Atherosclerotic heart disease of native coronary artery without angina pectoris: Secondary | ICD-10-CM | POA: Diagnosis not present

## 2022-03-20 DIAGNOSIS — E7800 Pure hypercholesterolemia, unspecified: Secondary | ICD-10-CM | POA: Diagnosis not present

## 2022-03-20 DIAGNOSIS — I10 Essential (primary) hypertension: Secondary | ICD-10-CM

## 2022-03-20 MED ORDER — HYDROCHLOROTHIAZIDE 12.5 MG PO CAPS: 12.5000 mg | ORAL_CAPSULE | ORAL | 0 refills | Status: DC

## 2022-03-26 ENCOUNTER — Ambulatory Visit: Payer: Medicare PPO | Admitting: Cardiology

## 2022-04-01 DIAGNOSIS — Z23 Encounter for immunization: Secondary | ICD-10-CM | POA: Diagnosis not present

## 2022-04-01 DIAGNOSIS — L570 Actinic keratosis: Secondary | ICD-10-CM | POA: Diagnosis not present

## 2022-04-09 ENCOUNTER — Ambulatory Visit: Payer: Medicare PPO | Admitting: Cardiology

## 2022-04-24 ENCOUNTER — Other Ambulatory Visit: Payer: Self-pay | Admitting: Cardiology

## 2022-04-24 DIAGNOSIS — Z0000 Encounter for general adult medical examination without abnormal findings: Secondary | ICD-10-CM | POA: Diagnosis not present

## 2022-04-24 DIAGNOSIS — I5032 Chronic diastolic (congestive) heart failure: Secondary | ICD-10-CM | POA: Diagnosis not present

## 2022-04-24 DIAGNOSIS — I2510 Atherosclerotic heart disease of native coronary artery without angina pectoris: Secondary | ICD-10-CM | POA: Diagnosis not present

## 2022-04-24 DIAGNOSIS — I129 Hypertensive chronic kidney disease with stage 1 through stage 4 chronic kidney disease, or unspecified chronic kidney disease: Secondary | ICD-10-CM | POA: Diagnosis not present

## 2022-04-24 DIAGNOSIS — R5383 Other fatigue: Secondary | ICD-10-CM | POA: Diagnosis not present

## 2022-04-24 DIAGNOSIS — E782 Mixed hyperlipidemia: Secondary | ICD-10-CM | POA: Diagnosis not present

## 2022-04-24 DIAGNOSIS — N182 Chronic kidney disease, stage 2 (mild): Secondary | ICD-10-CM | POA: Diagnosis not present

## 2022-05-01 DIAGNOSIS — I2510 Atherosclerotic heart disease of native coronary artery without angina pectoris: Secondary | ICD-10-CM | POA: Diagnosis not present

## 2022-05-01 DIAGNOSIS — N182 Chronic kidney disease, stage 2 (mild): Secondary | ICD-10-CM | POA: Diagnosis not present

## 2022-05-01 DIAGNOSIS — I129 Hypertensive chronic kidney disease with stage 1 through stage 4 chronic kidney disease, or unspecified chronic kidney disease: Secondary | ICD-10-CM | POA: Diagnosis not present

## 2022-05-01 DIAGNOSIS — Z0000 Encounter for general adult medical examination without abnormal findings: Secondary | ICD-10-CM | POA: Diagnosis not present

## 2022-05-01 DIAGNOSIS — I5032 Chronic diastolic (congestive) heart failure: Secondary | ICD-10-CM | POA: Diagnosis not present

## 2022-05-01 DIAGNOSIS — R2232 Localized swelling, mass and lump, left upper limb: Secondary | ICD-10-CM | POA: Diagnosis not present

## 2022-05-01 DIAGNOSIS — E782 Mixed hyperlipidemia: Secondary | ICD-10-CM | POA: Diagnosis not present

## 2022-06-13 ENCOUNTER — Other Ambulatory Visit: Payer: Self-pay | Admitting: Cardiology

## 2022-06-13 DIAGNOSIS — I10 Essential (primary) hypertension: Secondary | ICD-10-CM

## 2022-06-23 DIAGNOSIS — M720 Palmar fascial fibromatosis [Dupuytren]: Secondary | ICD-10-CM | POA: Diagnosis not present

## 2022-06-23 DIAGNOSIS — M79642 Pain in left hand: Secondary | ICD-10-CM | POA: Diagnosis not present

## 2022-06-25 DIAGNOSIS — H353132 Nonexudative age-related macular degeneration, bilateral, intermediate dry stage: Secondary | ICD-10-CM | POA: Diagnosis not present

## 2022-06-25 DIAGNOSIS — H35372 Puckering of macula, left eye: Secondary | ICD-10-CM | POA: Diagnosis not present

## 2022-06-25 DIAGNOSIS — H25813 Combined forms of age-related cataract, bilateral: Secondary | ICD-10-CM | POA: Diagnosis not present

## 2022-06-25 DIAGNOSIS — H18511 Endothelial corneal dystrophy, right eye: Secondary | ICD-10-CM | POA: Diagnosis not present

## 2022-07-02 DIAGNOSIS — D485 Neoplasm of uncertain behavior of skin: Secondary | ICD-10-CM | POA: Diagnosis not present

## 2022-07-02 DIAGNOSIS — L820 Inflamed seborrheic keratosis: Secondary | ICD-10-CM | POA: Diagnosis not present

## 2022-07-02 DIAGNOSIS — L821 Other seborrheic keratosis: Secondary | ICD-10-CM | POA: Diagnosis not present

## 2022-07-02 DIAGNOSIS — D225 Melanocytic nevi of trunk: Secondary | ICD-10-CM | POA: Diagnosis not present

## 2022-08-15 ENCOUNTER — Other Ambulatory Visit: Payer: Self-pay | Admitting: Cardiology

## 2022-08-15 DIAGNOSIS — I10 Essential (primary) hypertension: Secondary | ICD-10-CM

## 2022-09-04 DIAGNOSIS — E782 Mixed hyperlipidemia: Secondary | ICD-10-CM | POA: Diagnosis not present

## 2022-09-04 DIAGNOSIS — I2510 Atherosclerotic heart disease of native coronary artery without angina pectoris: Secondary | ICD-10-CM | POA: Diagnosis not present

## 2022-09-04 DIAGNOSIS — I129 Hypertensive chronic kidney disease with stage 1 through stage 4 chronic kidney disease, or unspecified chronic kidney disease: Secondary | ICD-10-CM | POA: Diagnosis not present

## 2022-09-04 DIAGNOSIS — I5032 Chronic diastolic (congestive) heart failure: Secondary | ICD-10-CM | POA: Diagnosis not present

## 2022-09-04 DIAGNOSIS — N182 Chronic kidney disease, stage 2 (mild): Secondary | ICD-10-CM | POA: Diagnosis not present

## 2022-09-08 ENCOUNTER — Encounter: Payer: Self-pay | Admitting: Cardiology

## 2022-09-08 ENCOUNTER — Other Ambulatory Visit: Payer: Self-pay | Admitting: Cardiology

## 2022-09-08 DIAGNOSIS — I10 Essential (primary) hypertension: Secondary | ICD-10-CM

## 2022-09-08 DIAGNOSIS — I2510 Atherosclerotic heart disease of native coronary artery without angina pectoris: Secondary | ICD-10-CM

## 2022-09-08 MED ORDER — OLMESARTAN MEDOXOMIL-HCTZ 40-12.5 MG PO TABS: 1.0000 | ORAL_TABLET | ORAL | 3 refills | Status: DC

## 2022-09-08 NOTE — Progress Notes (Signed)
ICD-10-CM   1. Coronary artery disease involving native coronary artery of native heart without angina pectoris  I25.10 olmesartan-hydrochlorothiazide (BENICAR HCT) 40-12.5 MG tablet    2. Primary hypertension  I10 olmesartan-hydrochlorothiazide (BENICAR HCT) 40-12.5 MG tablet     Meds ordered this encounter  Medications   olmesartan-hydrochlorothiazide (BENICAR HCT) 40-12.5 MG tablet    Sig: Take 1 tablet by mouth every morning.    Dispense:  90 tablet    Refill:  3   Medications Discontinued During This Encounter  Medication Reason   olmesartan (BENICAR) 40 MG tablet Change in therapy   hydrochlorothiazide (MICROZIDE) 12.5 MG capsule Change in therapy

## 2022-09-08 NOTE — Telephone Encounter (Signed)
From pt

## 2022-09-11 DIAGNOSIS — R21 Rash and other nonspecific skin eruption: Secondary | ICD-10-CM | POA: Diagnosis not present

## 2022-09-11 DIAGNOSIS — E782 Mixed hyperlipidemia: Secondary | ICD-10-CM | POA: Diagnosis not present

## 2022-09-11 DIAGNOSIS — I129 Hypertensive chronic kidney disease with stage 1 through stage 4 chronic kidney disease, or unspecified chronic kidney disease: Secondary | ICD-10-CM | POA: Diagnosis not present

## 2022-09-11 DIAGNOSIS — I5032 Chronic diastolic (congestive) heart failure: Secondary | ICD-10-CM | POA: Diagnosis not present

## 2022-09-11 DIAGNOSIS — N1831 Chronic kidney disease, stage 3a: Secondary | ICD-10-CM | POA: Diagnosis not present

## 2022-09-11 DIAGNOSIS — M25512 Pain in left shoulder: Secondary | ICD-10-CM | POA: Diagnosis not present

## 2022-09-12 DIAGNOSIS — M7552 Bursitis of left shoulder: Secondary | ICD-10-CM | POA: Diagnosis not present

## 2022-09-12 DIAGNOSIS — M7551 Bursitis of right shoulder: Secondary | ICD-10-CM | POA: Diagnosis not present

## 2022-09-15 DIAGNOSIS — R351 Nocturia: Secondary | ICD-10-CM | POA: Diagnosis not present

## 2022-09-15 DIAGNOSIS — N401 Enlarged prostate with lower urinary tract symptoms: Secondary | ICD-10-CM | POA: Diagnosis not present

## 2022-09-15 DIAGNOSIS — Z8546 Personal history of malignant neoplasm of prostate: Secondary | ICD-10-CM | POA: Diagnosis not present

## 2022-09-24 ENCOUNTER — Ambulatory Visit: Payer: Medicare PPO | Admitting: Cardiology

## 2022-09-24 ENCOUNTER — Encounter: Payer: Self-pay | Admitting: Cardiology

## 2022-09-24 VITALS — BP 126/76 | HR 56 | Resp 16 | Ht 70.0 in | Wt 170.8 lb

## 2022-09-24 DIAGNOSIS — E7800 Pure hypercholesterolemia, unspecified: Secondary | ICD-10-CM | POA: Diagnosis not present

## 2022-09-24 DIAGNOSIS — N1831 Chronic kidney disease, stage 3a: Secondary | ICD-10-CM | POA: Diagnosis not present

## 2022-09-24 DIAGNOSIS — I2510 Atherosclerotic heart disease of native coronary artery without angina pectoris: Secondary | ICD-10-CM | POA: Diagnosis not present

## 2022-09-24 DIAGNOSIS — I10 Essential (primary) hypertension: Secondary | ICD-10-CM

## 2022-09-24 DIAGNOSIS — I129 Hypertensive chronic kidney disease with stage 1 through stage 4 chronic kidney disease, or unspecified chronic kidney disease: Secondary | ICD-10-CM | POA: Diagnosis not present

## 2022-09-24 NOTE — Progress Notes (Signed)
Primary Physician/Referring:  Georgianne Fick, MD  Patient ID: Ronald House, male    DOB: 1946/03/06, 77 y.o.   MRN: [id number]  Chief Complaint  Patient presents with   Coronary Artery Disease   Hypertension   Follow-up    6 months   HPI:    Ronald House  is a 77 y.o. Caucasian male with history of CAD with stenting to large sized mid LCx in 2016 and balloon angioplasty due to focal restenosis in 2023, hypertension, hyperlipidemia and hyperglycemia presents for annual office visit.    He has not had any recurrence of angina pectoris, overall feeling well.  He has been walking at least for 30 minutes briskly every day.  Remains asymptomatic.  Past Medical History:  Diagnosis Date   Anxiety    Arteriosclerosis of both carotid arteries 06/02/2018   Arthritis    "lower back" (11/24/2014)   Bulging lumbar disc    Chronic lower back pain    Coronary artery disease    DDD (degenerative disc disease), lumbar    Dyspnea on exertion 06/02/2018   GERD (gastroesophageal reflux disease)    Hematuria    Peyronie disease    Prostate cancer (HCC)    Undiagnosed cardiac murmurs    childhood/unknown if present issue   Past Surgical History:  Procedure Laterality Date   CARDIAC CATHETERIZATION N/A 11/24/2014   Procedure: Left Heart Cath and Coronary Angiography;  Surgeon: Yates Decamp, MD;  Location: [facility] INVASIVE CV LAB;  Service: Cardiovascular;  Laterality: N/A;   CARDIAC CATHETERIZATION N/A 11/24/2014   Procedure: Coronary Stent Intervention;  Surgeon: Yates Decamp, MD;  Location: [facility] INVASIVE CV LAB;  Service: Cardiovascular;  Laterality: N/A;   CORONARY ANGIOPLASTY WITH STENT PLACEMENT  11/24/2014   "1 stent"   CORONARY BALLOON ANGIOPLASTY N/A 07/29/2021   Procedure: CORONARY BALLOON ANGIOPLASTY;  Surgeon: Yates Decamp, MD;  Location: [facility] INVASIVE CV LAB;  Service: Cardiovascular;  Laterality: N/A;   FINGER FRACTURE SURGERY Right 1980's   index finger   FRACTURE SURGERY     LEFT HEART CATH AND  CORONARY ANGIOGRAPHY N/A 07/29/2021   Procedure: LEFT HEART CATH AND CORONARY ANGIOGRAPHY;  Surgeon: Yates Decamp, MD;  Location: [facility] INVASIVE CV LAB;  Service: Cardiovascular;  Laterality: N/A;   OPEN TREATMENT ZYGOMATIC ARCH FRACTURE  ~ 1980   PROSTATE BIOPSY  <05/2011   RADIOACTIVE SEED IMPLANT  05/22/2011   Procedure: RADIOACTIVE SEED IMPLANT;  Surgeon: Kathi Ludwig, MD;  Location: [facility];  Service: Urology;  Laterality: N/A;  RAD TECH OK PER [facility]    TONSILLECTOMY AND ADENOIDECTOMY  1950's   Family History  Problem Relation Age of Onset   Benign prostatic hyperplasia Father    Cancer Father        throat   Heart disease Brother    Colon cancer Neg Hx     Social History   Tobacco Use   Smoking status: Never   Smokeless tobacco: Never  Substance Use Topics   Alcohol use: Yes    Alcohol/week: 5.0 standard drinks of alcohol    Types: 5 Glasses of wine per week    Comment: occasionally   Marital Status: Married   ROS  Review of Systems  Cardiovascular:  Negative for chest pain, dyspnea on exertion and leg swelling.    Objective  Blood pressure 126/76, pulse (!) 56, resp. rate 16, height 5\' 10"  (1.778 m), weight 170 lb 12.8 oz (77.5 kg), SpO2 98 %.  09/24/2022   11:23 AM 03/20/2022   10:05 AM 03/20/2022   10:04 AM  Vitals with BMI  Height 5\' 10"   5\' 10"   Weight 170 lbs 13 oz  170 lbs  BMI 24.51  24.39  Systolic 126 130 161  Diastolic 76 70 94  Pulse 56 58     Physical Exam Vitals reviewed.  Neck:     Vascular: No carotid bruit or JVD.  Cardiovascular:     Rate and Rhythm: Normal rate and regular rhythm.     Pulses: Intact distal pulses.     Heart sounds: S1 normal and S2 normal. Murmur heard.     Systolic murmur is present with a grade of 2/6 at the upper right sternal border radiating to the apex.     No gallop.  Pulmonary:     Effort: Pulmonary effort is normal. No respiratory distress.     Breath sounds: No wheezing, rhonchi  or rales.  Abdominal:     General: Bowel sounds are normal.     Palpations: Abdomen is soft.  Musculoskeletal:     Right lower leg: No edema.     Left lower leg: No edema.    Laboratory examination:   HEMOGLOBIN A1C Lab Results  Component Value Date   HGBA1C 6.0 (H) 07/28/2021   MPG 125.5 07/28/2021   TSH Lab Results  Component Value Date   TSH 1.212 07/28/2021    External labs:   Labs 09/12/2022:  Serum glucose 97 mg, BUN 24, creatinine 1.30, EGFR 57 mL, potassium 4.6, LFTs normal.  Hb 13.9/HCT 42.4, platelets 231, normal indicis.  Labs 04/28/2022:  TSH normal at 2.40.  Urinary albumin to creatinine ratio 7.0.  Total cholesterol 137, triglycerides 56, HDL 66, LDL 68.  Radiology:   No results found.  DG Chest 2 View: 07/27/2021 The cardiomediastinal silhouette is unchanged and normal in contour.Atherosclerotic calcifications. No pleural effusion. No pneumothorax. Calcified granuloma of the LEFT apex. No acute pleuroparenchymal abnormality. Visualized abdomen is unremarkable. Mild degenerative changes of the thoracic spine.  IMPRESSION: No acute cardiopulmonary abnormality.   Cardiac Studies:   Echocardiogram 07/28/2021:  1. Left ventricular ejection fraction, by estimation, is 70 to 75%. The left ventricle has hyperdynamic function. The left ventricle has no regional wall motion abnormalities. There is mild asymmetric left ventricular hypertrophy of the basal-septal  segment. Left ventricular diastolic parameters are consistent with Grade I diastolic dysfunction (impaired relaxation).  2. Right ventricular systolic function is normal. The right ventricular size is normal. There is normal pulmonary artery systolic pressure. The estimated right ventricular systolic pressure is 28.8 mmHg.  3. The mitral valve is normal in structure. No evidence of mitral valve regurgitation. No evidence of mitral stenosis.  4. The aortic valve is tricuspid. Aortic valve regurgitation is  trivial.  5. The inferior vena cava is normal in size with greater than 50% respiratory variability, suggesting right atrial pressure of 3 mmHg.   Left Heart Catheterization 07/29/21:  LV: 128/-5, EDP 6 mmHg, AO 135/61, mean 90 mmHg.  No pressure gradient across the aortic valve.  LVEF 55 to 60% with no significant mitral regurgitation. RCA: Dominant, smooth and normal.  Compared to 2016, mid RCA stenosis of 50% no longer present. LM: Mildly calcified, no significant disease. LAD: Large vessel, gives origin to moderate-sized diagonals and proximal and mid segment, after the origin of D1, there is 10 to 15% mild luminal irregularity LCx: Very large vessel.  Gives origin to small OM1, large OM 2 after  conversion to a small degree circumflex.  Mid segment of this circumflex has a 4.0 x 22 mm resolute DES placed on 11/24/2014, has in-stent 80% stenosis.  Mild stenosis is also evident in the distal end of the stent. Intervention data: Successful PTCA and balloon angioplasty with a 4.0 x 15 mm score flex balloon, entire stented segment and distal to the stent balloon angioplasty was performed, 0% distal stenosis and stent like results.  TIMI-3 flow was maintained.     ABI 08/22/2021: This exam reveals normal perfusion of the right lower extremity (ABI 1.46).   This exam reveals normal perfusion of the left lower extremity (ABI 1.61).  Normal triphasic waveform pattern at the ankles.  PCV CARDIAC STRESS TEST 11/01/2021  Narrative Exercise treadmill stress test 11/01/2021: Functional status: excellent. Chest pain: No. Reason for stopping exercise: Maximum exercise capacity achieved. Hypertensive response to exercise: Yes (resting 170/84, peak 240/80). Exercise time 09 minutes 17 seconds on Bruce protocol, achieved 10.61 METS, 79% of age-predicted maximum heart rate (APMHR). Stress ECG nondiagnostic for ischemia (submaximal effort and target heart rate not achieved) but at the current workload  stress ECG negative for ischemia. Clinical correlation required.   EKG:   EKG 09/24/2022: Normal sinus rhythm at rate of 56 bpm, normal EKG.  Compared to 03/20/2022, no change.  Allergies & Medications  No Known Allergies  Current Outpatient Medications:    aspirin EC 81 MG EC tablet, Take 1 tablet (81 mg total) by mouth daily., Disp: 30 tablet, Rfl: 0   atorvastatin (LIPITOR) 80 MG tablet, TAKE 1 TABLET(80 MG) BY MOUTH DAILY AT 6 PM, Disp: 90 tablet, Rfl: 1   carvedilol (COREG) 3.125 MG tablet, TAKE 1 TABLET(3.125 MG) BY MOUTH TWICE DAILY WITH A MEAL, Disp: 180 tablet, Rfl: 3   cholecalciferol (VITAMIN D3) 25 MCG (1000 UNIT) tablet, Take 1,000 Units by mouth daily., Disp: , Rfl:    COLLAGEN PO, Take by mouth., Disp: , Rfl:    lidocaine 4 %, Place 1 patch onto the skin once a week., Disp: , Rfl:    Multiple Vitamin (MULTIVITAMIN) tablet, Take 1 tablet by mouth daily., Disp: , Rfl:    olmesartan-hydrochlorothiazide (BENICAR HCT) 40-12.5 MG tablet, Take 1 tablet by mouth every morning., Disp: 90 tablet, Rfl: 3   Propylene Glycol (SYSTANE COMPLETE OP), Place 1 drop into both eyes daily as needed (dry eyes)., Disp: , Rfl:    nitroGLYCERIN (NITROSTAT) 0.4 MG SL tablet, Place 1 tablet (0.4 mg total) under the tongue every 5 (five) minutes x 3 doses as needed for chest pain. (Patient not taking: Reported on 09/24/2022), Disp: 25 tablet, Rfl: 12   Assessment     ICD-10-CM   1. Coronary artery disease involving native coronary artery of native heart without angina pectoris  I25.10 EKG 12-Lead    2. Primary hypertension  I10     3. Pure hypercholesterolemia  E78.00     4. Stage 3a chronic kidney disease (HCC)  N18.31        There are no discontinued medications.   No orders of the defined types were placed in this encounter.   Recommendations:   Ronald House is a 77 y.o. Caucasian male with history of CAD with stenting to large sized mid LCx in 2016 and balloon angioplasty due to focal  restenosis in 2023, hypertension, hyperlipidemia and hyperglycemia presents for annual office visit.    1. Coronary artery disease involving native coronary artery of native heart without angina pectoris Patient is  completely asymptomatic, continues to exercise regularly.  No change in his EKG.  Although 77 years of age, looks well.  I have congratulated him and advised him to continue to push himself and exercise regularly.  2. Primary hypertension with stage IIIa chronic kidney disease I reviewed his external labs, renal function has remained stable, blood pressure is well-controlled, he is on an ARB, continue the same.  3. Pure hypercholesterolemia No labs, LDL is at goal at <70.  Continue present dose statin.  I will see him back on annual basis.     Yates Decamp, MD, [facility] 09/24/2022, 12:00 PM Office: [phone] Fax: [phone] Pager: [phone]

## 2022-10-01 ENCOUNTER — Ambulatory Visit: Payer: Medicare PPO | Admitting: Cardiology

## 2022-10-04 ENCOUNTER — Other Ambulatory Visit: Payer: Self-pay | Admitting: Cardiology

## 2022-10-17 ENCOUNTER — Other Ambulatory Visit: Payer: Self-pay | Admitting: Cardiology

## 2022-12-19 DIAGNOSIS — Z23 Encounter for immunization: Secondary | ICD-10-CM | POA: Diagnosis not present

## 2022-12-29 DIAGNOSIS — H25813 Combined forms of age-related cataract, bilateral: Secondary | ICD-10-CM | POA: Diagnosis not present

## 2022-12-29 DIAGNOSIS — H35372 Puckering of macula, left eye: Secondary | ICD-10-CM | POA: Diagnosis not present

## 2022-12-29 DIAGNOSIS — H18511 Endothelial corneal dystrophy, right eye: Secondary | ICD-10-CM | POA: Diagnosis not present

## 2022-12-29 DIAGNOSIS — H524 Presbyopia: Secondary | ICD-10-CM | POA: Diagnosis not present

## 2022-12-29 DIAGNOSIS — H353132 Nonexudative age-related macular degeneration, bilateral, intermediate dry stage: Secondary | ICD-10-CM | POA: Diagnosis not present

## 2022-12-29 DIAGNOSIS — H52223 Regular astigmatism, bilateral: Secondary | ICD-10-CM | POA: Diagnosis not present

## 2023-04-14 ENCOUNTER — Other Ambulatory Visit: Payer: Self-pay | Admitting: Cardiology

## 2023-04-30 DIAGNOSIS — I129 Hypertensive chronic kidney disease with stage 1 through stage 4 chronic kidney disease, or unspecified chronic kidney disease: Secondary | ICD-10-CM | POA: Diagnosis not present

## 2023-04-30 DIAGNOSIS — N1831 Chronic kidney disease, stage 3a: Secondary | ICD-10-CM | POA: Diagnosis not present

## 2023-04-30 DIAGNOSIS — E782 Mixed hyperlipidemia: Secondary | ICD-10-CM | POA: Diagnosis not present

## 2023-04-30 DIAGNOSIS — R5383 Other fatigue: Secondary | ICD-10-CM | POA: Diagnosis not present

## 2023-04-30 DIAGNOSIS — I130 Hypertensive heart and chronic kidney disease with heart failure and stage 1 through stage 4 chronic kidney disease, or unspecified chronic kidney disease: Secondary | ICD-10-CM | POA: Diagnosis not present

## 2023-04-30 DIAGNOSIS — I2510 Atherosclerotic heart disease of native coronary artery without angina pectoris: Secondary | ICD-10-CM | POA: Diagnosis not present

## 2023-04-30 DIAGNOSIS — I5032 Chronic diastolic (congestive) heart failure: Secondary | ICD-10-CM | POA: Diagnosis not present

## 2023-04-30 DIAGNOSIS — Z0000 Encounter for general adult medical examination without abnormal findings: Secondary | ICD-10-CM | POA: Diagnosis not present

## 2023-04-30 DIAGNOSIS — Z23 Encounter for immunization: Secondary | ICD-10-CM | POA: Diagnosis not present

## 2023-05-07 DIAGNOSIS — I5032 Chronic diastolic (congestive) heart failure: Secondary | ICD-10-CM | POA: Diagnosis not present

## 2023-05-07 DIAGNOSIS — Z0000 Encounter for general adult medical examination without abnormal findings: Secondary | ICD-10-CM | POA: Diagnosis not present

## 2023-05-07 DIAGNOSIS — I2510 Atherosclerotic heart disease of native coronary artery without angina pectoris: Secondary | ICD-10-CM | POA: Diagnosis not present

## 2023-05-07 DIAGNOSIS — N1831 Chronic kidney disease, stage 3a: Secondary | ICD-10-CM | POA: Diagnosis not present

## 2023-05-07 DIAGNOSIS — E782 Mixed hyperlipidemia: Secondary | ICD-10-CM | POA: Diagnosis not present

## 2023-05-07 DIAGNOSIS — I129 Hypertensive chronic kidney disease with stage 1 through stage 4 chronic kidney disease, or unspecified chronic kidney disease: Secondary | ICD-10-CM | POA: Diagnosis not present

## 2023-05-07 DIAGNOSIS — I130 Hypertensive heart and chronic kidney disease with heart failure and stage 1 through stage 4 chronic kidney disease, or unspecified chronic kidney disease: Secondary | ICD-10-CM | POA: Diagnosis not present

## 2023-06-10 ENCOUNTER — Other Ambulatory Visit: Payer: Self-pay | Admitting: Cardiology

## 2023-06-10 DIAGNOSIS — I10 Essential (primary) hypertension: Secondary | ICD-10-CM

## 2023-06-10 DIAGNOSIS — I2510 Atherosclerotic heart disease of native coronary artery without angina pectoris: Secondary | ICD-10-CM

## 2023-06-15 DIAGNOSIS — D225 Melanocytic nevi of trunk: Secondary | ICD-10-CM | POA: Diagnosis not present

## 2023-06-15 DIAGNOSIS — L570 Actinic keratosis: Secondary | ICD-10-CM | POA: Diagnosis not present

## 2023-06-15 DIAGNOSIS — L821 Other seborrheic keratosis: Secondary | ICD-10-CM | POA: Diagnosis not present

## 2023-06-26 DIAGNOSIS — H25813 Combined forms of age-related cataract, bilateral: Secondary | ICD-10-CM | POA: Diagnosis not present

## 2023-06-26 DIAGNOSIS — H353132 Nonexudative age-related macular degeneration, bilateral, intermediate dry stage: Secondary | ICD-10-CM | POA: Diagnosis not present

## 2023-06-26 DIAGNOSIS — H18513 Endothelial corneal dystrophy, bilateral: Secondary | ICD-10-CM | POA: Diagnosis not present

## 2023-06-26 DIAGNOSIS — H35372 Puckering of macula, left eye: Secondary | ICD-10-CM | POA: Diagnosis not present

## 2023-07-13 DIAGNOSIS — H25812 Combined forms of age-related cataract, left eye: Secondary | ICD-10-CM | POA: Diagnosis not present

## 2023-07-21 DIAGNOSIS — H268 Other specified cataract: Secondary | ICD-10-CM | POA: Diagnosis not present

## 2023-07-21 DIAGNOSIS — H25812 Combined forms of age-related cataract, left eye: Secondary | ICD-10-CM | POA: Diagnosis not present

## 2023-09-14 DIAGNOSIS — M7022 Olecranon bursitis, left elbow: Secondary | ICD-10-CM | POA: Diagnosis not present

## 2023-09-19 ENCOUNTER — Other Ambulatory Visit: Payer: Self-pay | Admitting: Cardiology

## 2023-09-23 ENCOUNTER — Ambulatory Visit: Payer: Medicare PPO | Admitting: Cardiology

## 2023-09-24 DIAGNOSIS — M7022 Olecranon bursitis, left elbow: Secondary | ICD-10-CM | POA: Diagnosis not present

## 2023-09-25 ENCOUNTER — Ambulatory Visit: Attending: Cardiology | Admitting: Cardiology

## 2023-09-25 ENCOUNTER — Encounter: Payer: Self-pay | Admitting: Cardiology

## 2023-09-25 VITALS — BP 135/78 | HR 52 | Resp 16 | Ht 70.0 in | Wt 173.8 lb

## 2023-09-25 DIAGNOSIS — I10 Essential (primary) hypertension: Secondary | ICD-10-CM

## 2023-09-25 DIAGNOSIS — I2510 Atherosclerotic heart disease of native coronary artery without angina pectoris: Secondary | ICD-10-CM

## 2023-09-25 DIAGNOSIS — E7800 Pure hypercholesterolemia, unspecified: Secondary | ICD-10-CM | POA: Diagnosis not present

## 2023-09-25 DIAGNOSIS — R1319 Other dysphagia: Secondary | ICD-10-CM | POA: Diagnosis not present

## 2023-09-25 DIAGNOSIS — N1831 Chronic kidney disease, stage 3a: Secondary | ICD-10-CM

## 2023-09-25 NOTE — Patient Instructions (Addendum)
   Medication Instructions:   Do not take Atorvastatin  for 2 weeks and then restart atorvastatin  80 mg 5 days a week off on Saturday and Sunday  Stop Carvedilol   *If you need a refill on your cardiac medications before your next appointment, please call your pharmacy*  Lab Work: none If you have labs (blood work) drawn today and your tests are completely normal, you will receive your results only by: MyChart Message (if you have MyChart) OR A paper copy in the mail If you have any lab test that is abnormal or we need to change your treatment, we will call you to review the results.  Testing/Procedures: none  Follow-Up: At [facility], you and your health needs are our priority.  As part of our continuing mission to provide you with exceptional heart care, our providers are all part of one team.  This team includes your primary Cardiologist (physician) and Advanced Practice Providers or APPs (Physician Assistants and Nurse Practitioners) who all work together to provide you with the care you need, when you need it.  Your next appointment:   12 month(s)  Provider:   Dr Ladona    We recommend signing up for the patient portal called MyChart.  Sign up information is provided on this After Visit Summary.  MyChart is used to connect with patients for Virtual Visits (Telemedicine).  Patients are able to view lab/test results, encounter notes, upcoming appointments, etc.  Non-urgent messages can be sent to your provider as well.   To learn more about what you can do with MyChart, go to [email or web address].   Other Instructions You have been referred to gastroenterology

## 2023-09-25 NOTE — Progress Notes (Signed)
 " Cardiology Office Note:  .   Date:  09/26/2023  ID:  Ronald House, DOB 16-Jul-1945, MRN [id number] PCP: Verdia Lombard, MD  [facility] Providers Cardiologist:  Gordy Bergamo, MD   History of Present Illness: .   Ronald House is a 78 y.o. Caucasian male with history of CAD with stenting to large sized mid LCx in 2016 and balloon angioplasty due to focal restenosis in 2023, hypertension, hyperlipidemia and hyperglycemia presents for annual office visit.  Patient has had echocardiogram on 07/28/2021 revealing hyperdynamic LVEF at 70 to 75% with mild basal septal hypertrophy without significant valvular abnormality.  He has also had a routine treadmill exercise stress test on 11/01/2021 revealing excellent exercise tolerance of 9 minutes and 17 seconds achieving 10.61 METS but had mild chronotropic incompetence probably related to medications.  Discussed the use of AI scribe software for clinical note transcription with the patient, who gave verbal consent to proceed.  History of Present Illness Ronald House is a 78 year old male with coronary artery disease who presents with muscle soreness and joint aching.  He experiences muscle soreness and joint aching, which he attributes to his exercise routine. He is concerned about the potential side effects of atorvastatin  on these symptoms. He has been on atorvastatin  80 mg once daily since 2016 and olmesartan  HCT 40/12.5 mg for blood pressure. He exercises regularly and maintains a decent diet.  His past medical history includes coronary artery disease with heart blockage. He is on aspirin  81 mg once daily. He also has kidney disease. His cholesterol levels are well-controlled, with an LDL of 55 as of February 2025. He has prediabetes with an A1c of 6.0 in 2023.  His main complaint today was difficulty swallowing and coughing after eating, feels like the food gets stuck in the middle of his chest.  Started a few months ago.  Labs   Lab Results   Component Value Date   HGBA1C 6.0 (H) 07/28/2021    Lipoprotein (a)  Date/Time Value Ref Range Status  07/29/2021 02:23 AM 44.6 (H) <75.0 nmol/L Final    Comment:    (NOTE) Note:  Values greater than or equal to 75.0 nmol/L may       indicate an independent risk factor for CHD,       but must be evaluated with caution when applied       to non-Caucasian populations due to the       influence of genetic factors on Lp(a) across       ethnicities. Performed At: [facility] [address] [place], [place] [id number] Jennette Shorter MD Ey:[phone]     External Labs:  PCP labs on Care Everywhere 05/05/2023:  TSH normal at 2.140.  Serum glucose 97 mg,.  22, creatinine 1.39, EGFR 53 mL, potassium 4.5, LFTs normal.  Total cholesterol 131, triglycerides 59, HDL 63, LDL 55.  Hb 15.0/HCT 46.4, platelets 231.  ROS  Review of Systems  Cardiovascular:  Negative for chest pain, dyspnea on exertion and leg swelling.  Gastrointestinal:  Positive for dysphagia.   Physical Exam:   VS:  BP 135/78 (BP Location: Right Arm, Patient Position: Sitting, Cuff Size: Normal)   Pulse (!) 52   Resp 16   Ht 5' 10 (1.778 m)   Wt 173 lb 12.8 oz (78.8 kg)   SpO2 93%   BMI 24.94 kg/m    Wt Readings from Last 3 Encounters:  09/25/23 173 lb 12.8 oz (78.8 kg)  09/24/22 170 lb 12.8 oz (77.5 kg)  03/20/22 170 lb (77.1 kg)    Physical Exam Neck:     Vascular: No carotid bruit or JVD.  Cardiovascular:     Rate and Rhythm: Normal rate and regular rhythm.     Pulses: Intact distal pulses.     Heart sounds: Normal heart sounds. No murmur heard.    No gallop.  Pulmonary:     Effort: Pulmonary effort is normal.     Breath sounds: Normal breath sounds.  Abdominal:     General: Bowel sounds are normal.     Palpations: Abdomen is soft.  Musculoskeletal:     Right lower leg: No edema.     Left lower leg: No edema.    Studies Reviewed: .    Echocardiogram 07/28/2021:  1. Left  ventricular ejection fraction, by estimation, is 70 to 75%. The left ventricle has hyperdynamic function. The left ventricle has no regional wall motion abnormalities. There is mild asymmetric left ventricular hypertrophy of the basal-septal  segment. Left ventricular diastolic parameters are consistent with Grade I diastolic dysfunction (impaired relaxation).  2. Right ventricular systolic function is normal. The right ventricular size is normal. There is normal pulmonary artery systolic pressure. The estimated right ventricular systolic pressure is 28.8 mmHg.  3. The mitral valve is normal in structure. No evidence of mitral valve regurgitation. No evidence of mitral stenosis.  4. The aortic valve is tricuspid. Aortic valve regurgitation is trivial.  5. The inferior vena cava is normal in size with greater than 50% respiratory variability, suggesting right atrial pressure of 3 mmHg.   Left Heart Catheterization 07/29/21:  4.0 x 22 mm resolute DES placed on 11/24/2014, has in-stent 80% stenosis 4.0 x 15 mm score flex balloon, entire stented segment and distal to the stent balloon angioplasty       EKG:    EKG Interpretation Date/Time:  Friday September 25 2023 09:56:35 EDT Ventricular Rate:  54 PR Interval:  152 QRS Duration:  88 QT Interval:  406 QTC Calculation: 385 R Axis:   33  Text Interpretation: EKG 09/25/2023: Sinus bradycardia at rate of 54 bpm, normal axis.  Nonspecific T abnormality in V2 and V3.  Compared to 08/07/2021, no significant change. Confirmed by Tarik Teixeira, Jagadeesh ([id number]) on 09/25/2023 10:45:29 AM    Medications ordered    No orders of the defined types were placed in this encounter.    ASSESSMENT AND PLAN: .      ICD-10-CM   1. Coronary artery disease involving native coronary artery of native heart without angina pectoris  I25.10 EKG 12-Lead    2. Primary hypertension  I10     3. Pure hypercholesterolemia  E78.00     4. Stage 3a chronic kidney disease (HCC)  N18.31      5. Esophageal dysphagia  R13.19 Ambulatory referral to Gastroenterology     Orders Placed This Encounter  Procedures   Ambulatory referral to Gastroenterology    Referral Priority:   Urgent    Referral Type:   Consultation    Referral Reason:   Specialty Services Required    Number of Visits Requested:   1   EKG 12-Lead   Assessment & Plan Chronic coronary artery disease Well-managed with no recent chest pain. Physically active and on appropriate medications including aspirin  and omisartan HCT. No indication for beta blockers as he is not experiencing angina. - Discontinue carvedilol  - Continue aspirin  81 mg daily - Continue omisartan HCT 40/12.5 mg daily -  Blood pressure is also well-controlled on present medical regimen and with ARB protecting stage IIIa chronic kidney disease.  Muscle soreness possibly due to statins Muscle soreness and joint aching possibly related to atorvastatin  use. LDL is well controlled at 55 mg/dL. - Discontinue atorvastatin  for 10 days - Resume atorvastatin  5 days a week after 10-day break - Print instructions for medication adjustment  Mild chronic kidney disease, stage 3A Well-managed with no significant changes or concerns at this time.  Esophageal dysphagia Chronic swallowing difficulty with associated coughing and sensation of constriction in the throat. Symptoms suggest possible esophageal constriction. - Refer to gastroenterologist for evaluation and possible EGD - Advise to chew thoroughly before swallowing and consume soft foods - Request urgent appointment with GI specialist; follow up if not scheduled within a few days  Patient prefers to continue to follow me annually.   Signed,  Gordy Bergamo, MD, [facility] 09/26/2023, 6:38 AM [facility] [address] [place], [place] [address] Phone: [phone]. Fax:  [phone]  "

## 2023-10-02 NOTE — Progress Notes (Signed)
 10/05/2023 Norleen ONEIDA Clarity [id number] 03-24-1945  Referring provider: Verdia Lombard, MD Primary GI doctor: Dr. Charlanne (Dr. Aneita)  ASSESSMENT AND PLAN:  Dysphagia distal esophagus x 1 years,  can have regurg of food/water but never vomiting, has increase mucus for years, new globulus sensation and worsening gag reflex, nausea at times Rare GERD, not on meds, no tobacco use, wine with dinner, no NSAIDS, no neck surgery or radiation Possible LPR, very concerned for stenosis, gastritis -Schedule EGD with dilatation to evaluate for stenosis, tumor, erosive/infectious esophagititis, and EOE. I discussed risks of EGD with patient today, including risk of sedation, bleeding or perforation.  Patient provides understanding and gave verbal consent to proceed. - start on pepcid  at night, consider adding PPI - consider barium swallow -In the interim patient advised about swallowing precautions.  -Eat slowly, chew food well before swallowing.  -Drink liquids in between each bite to avoid food impaction. - ER precautions discussed with the patient  Screening colonoscopy 2004 colonoscopy tics and HM 04/03/2013 colonoscopy Dr. Aneita moderate diverticulosis sigmoid colon, small internal hemorrhoids recall 10 years. No changes in stools, no family history of colon cancer Patient does not wish for any further screening and he is over the age of 50 and we normally stop surveillance/screening. He is aware that if he develops any symptoms going forward, we may need to proceed with a colonoscopy in the future  CAD status post stent LCx 2016 and balloon angioplasty focal restenosis 2023 Follows with Dr. Ladona last seen in the office 09/25/2023 Echo 2023 EF 70-75% no valvular abnormality Exercise stress test 2023 excellent tolerance no ischemia No chest pain, no SOB  History of prostate cancer 2013  Patient Care Team: Verdia Lombard, MD as PCP - General (Internal Medicine) Ladona Heinz, MD as  PCP - Cardiology (Cardiology) Ladona Heinz, MD as Consulting Physician (Cardiology) Livingston Rigg, MD (Inactive) as Consulting Physician (Dermatology)  HISTORY OF PRESENT ILLNESS: 78 y.o. male with a past medical history listed below presents for evaluation of dysphagia.   Discussed the use of AI scribe software for clinical note transcription with the patient, who gave verbal consent to proceed.  History of Present Illness   GURKARAN RAHM is a 78 year old male with diverticulosis and coronary artery disease who presents with dysphagia and hoarseness.  He has experienced difficulty swallowing for nearly a year, with food feeling 'stuck' in his throat. This is accompanied by belching, gas, and coughing, sometimes necessitating him to leave the table. Drinking water exacerbates the sensation. No weight loss, abdominal pain, dark stools, or blood in the stool.  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 The mucus has changed from yellowish to white. He also reports a sensation of constriction in his throat, worsened by brushing his teeth, and a heightened gag reflex. He has experienced hoarseness, which he considers his 'normal voice now'.  No significant heartburn or sustained reflux symptoms, although he occasionally experiences what he describes as 'normal' reflux. No history of tobacco use beyond a brief period at age 89 and reports rare alcohol consumption, typically a glass with dinner or occasionally something stronger with his brother.  He takes aspirin  following a stent placement and was previously on carvedilol , which was discontinued due to muscle fatigue and vertigo. He has a history of degenerative disc disease with no surgical interventions, and he has received injections for back pain.  He  reports that he has never smoked. He has never used smokeless tobacco. He reports  current alcohol use of about 5.0 standard drinks of alcohol per week. He reports that he does not use drugs.  RELEVANT GI HISTORY, IMAGING AND LABS: Results   LABS PSA: 0.02 (10/05/2023)  DIAGNOSTIC Colonoscopy: Diverticulosis, small internal hemorrhoids (04/03/2013) Stress test: Normal (2023)      CBC    Component Value Date/Time   WBC 6.3 08/07/2021 0800   RBC 4.61 08/07/2021 0800   HGB 14.6 08/07/2021 0800   HCT 43.4 08/07/2021 0800   PLT 220 08/07/2021 0800   MCV 94.1 08/07/2021 0800   MCV 89.3 10/01/2015 1127   MCH 31.7 08/07/2021 0800   MCHC 33.6 08/07/2021 0800   RDW 13.1 08/07/2021 0800   LYMPHSABS 2.2 08/07/2021 0800   MONOABS 0.5 08/07/2021 0800   EOSABS 0.2 08/07/2021 0800   BASOSABS 0.0 08/07/2021 0800   No results for input(s): HGB in the last 8760 hours.  CMP     Component Value Date/Time   NA 138 08/07/2021 0800   K 4.7 08/07/2021 0800   CL 105 08/07/2021 0800   CO2 25 08/07/2021 0800   GLUCOSE 98 08/07/2021 0800   BUN 27 (H) 08/07/2021 0800   CREATININE 1.39 (H) 08/07/2021 0800   CREATININE 1.25 (H) 08/21/2020 1312   CALCIUM  9.1 08/07/2021 0800   PROT 6.2 (L) 08/07/2021 0800   ALBUMIN 3.9 08/07/2021 0800   AST 29 08/07/2021 0800   ALT 22 08/07/2021 0800   ALKPHOS 75 08/07/2021 0800   BILITOT 0.7 08/07/2021 0800   GFRNONAA 53 (L) 08/07/2021 0800   GFRAA >60 12/25/2018 0943      Latest Ref Rng & Units 08/07/2021    8:00 AM 08/21/2020    1:12 PM 07/09/2020   11:30 AM  Hepatic Function  Total Protein 6.5 - 8.1 g/dL 6.2  6.9  6.9   Albumin 3.5 - 5.0 g/dL 3.9     AST 15 - 41 U/L 29  19  31    ALT 0 - 44 U/L 22  15  37   Alk Phosphatase 38 - 126 U/L 75     Total Bilirubin 0.3 - 1.2 mg/dL 0.7  0.7  0.6       Current Medications:    Current Outpatient Medications (Cardiovascular):    atorvastatin  (LIPITOR ) 80 MG tablet, TAKE 1 TABLET(80 MG) BY MOUTH DAILY AT 6 PM (Patient taking differently: Pt taking 5 days instead of 7)    olmesartan -hydrochlorothiazide  (BENICAR  HCT) 40-12.5 MG tablet, TAKE 1 TABLET BY MOUTH EVERY MORNING   nitroGLYCERIN  (NITROSTAT ) 0.4 MG SL tablet, Place 1 tablet (0.4 mg total) under the tongue every 5 (five) minutes x 3 doses as needed for chest pain. (Patient not taking: Reported on 10/05/2023)   Current Outpatient Medications (Analgesics):    aspirin  EC 81 MG EC tablet, Take 1 tablet (81 mg total) by mouth daily.   Current Outpatient Medications (Other):    cholecalciferol (VITAMIN D3) 25 MCG (1000 UNIT) tablet, Take 1,000 Units by mouth daily.   COLLAGEN PO, Take by mouth.   famotidine  (PEPCID ) 40 MG tablet, Take 1 tablet (40 mg total) by mouth at bedtime.   lidocaine  4 %, Place 1 patch onto the skin once a week.   Multiple Vitamin (MULTIVITAMIN) tablet, Take 1 tablet by mouth daily.   Propylene Glycol (SYSTANE COMPLETE OP), Place 1 drop into both eyes daily as needed (dry eyes).  Medical History:  Past Medical History:  Diagnosis Date   Anxiety    Arteriosclerosis of both carotid arteries 06/02/2018   Arthritis    lower back (11/24/2014)   Bulging lumbar disc    Chronic lower back pain    Coronary artery disease    DDD (degenerative disc disease), lumbar    Dyspnea on exertion 06/02/2018   GERD (gastroesophageal reflux disease)    Hematuria    Peyronie disease    Prostate cancer (HCC)    Undiagnosed cardiac murmurs    childhood/unknown if present issue   Allergies: No Known Allergies   Surgical History:  He  has a past surgical history that includes Open treatment zygomatic arch fracture (~ 1980); Finger fracture surgery (Right, 1980's); Radioactive seed implant (05/22/2011); Coronary angioplasty with stent (11/24/2014); Tonsillectomy and adenoidectomy (1950's); Fracture surgery; Prostate biopsy (<05/2011); Cardiac catheterization (N/A, 11/24/2014); Cardiac catheterization (N/A, 11/24/2014); LEFT HEART CATH AND CORONARY ANGIOGRAPHY (N/A, 07/29/2021); and CORONARY BALLOON ANGIOPLASTY  (N/A, 07/29/2021). Family History:  His family history includes Benign prostatic hyperplasia in his father; Cancer in his father; Heart disease in his brother.  REVIEW OF SYSTEMS  : All other systems reviewed and negative except where noted in the History of Present Illness.  PHYSICAL EXAM: BP 122/70   Pulse 78   Ht 5' 10 (1.778 m)   Wt 170 lb (77.1 kg)   BMI 24.39 kg/m  Physical Exam   GENERAL APPEARANCE: Well nourished, in no apparent distress. HEENT: No cervical lymphadenopathy, unremarkable thyroid , sclerae anicteric, conjunctiva pink. RESPIRATORY: Respiratory effort normal, breath sounds equal bilaterally without rales, rhonchi, or wheezing. Lungs clear to auscultation. CARDIO: Regular rate and rhythm with no murmurs, rubs, or gallops, peripheral pulses intact. ABDOMEN: Soft, non-distended, active bowel sounds in all four quadrants, non-tender to palpation, no rebound, no mass appreciated. RECTAL: Declines. MUSCULOSKELETAL: Full range of motion, normal gait, without edema. SKIN: Dry, intact without rashes or lesions. No jaundice. NEURO: Alert, oriented, no focal deficits. PSYCH: Cooperative, normal mood and affect.      Alan JONELLE Coombs, PA-C 1:44 PM

## 2023-10-05 ENCOUNTER — Ambulatory Visit: Admitting: Physician Assistant

## 2023-10-05 ENCOUNTER — Encounter: Payer: Self-pay | Admitting: Physician Assistant

## 2023-10-05 VITALS — BP 122/70 | HR 78 | Ht 70.0 in | Wt 170.0 lb

## 2023-10-05 DIAGNOSIS — R351 Nocturia: Secondary | ICD-10-CM | POA: Diagnosis not present

## 2023-10-05 DIAGNOSIS — I25110 Atherosclerotic heart disease of native coronary artery with unstable angina pectoris: Secondary | ICD-10-CM | POA: Diagnosis not present

## 2023-10-05 DIAGNOSIS — Z8546 Personal history of malignant neoplasm of prostate: Secondary | ICD-10-CM

## 2023-10-05 DIAGNOSIS — N401 Enlarged prostate with lower urinary tract symptoms: Secondary | ICD-10-CM | POA: Diagnosis not present

## 2023-10-05 DIAGNOSIS — Z1211 Encounter for screening for malignant neoplasm of colon: Secondary | ICD-10-CM

## 2023-10-05 DIAGNOSIS — R1319 Other dysphagia: Secondary | ICD-10-CM | POA: Diagnosis not present

## 2023-10-05 MED ORDER — FAMOTIDINE 40 MG PO TABS: 40.0000 mg | ORAL_TABLET | Freq: Every day | ORAL | 0 refills | Status: DC

## 2023-10-05 NOTE — Patient Instructions (Addendum)
 _______________________________________________________  If your blood pressure at your visit was 140/90 or greater, please contact your primary care physician to follow up on this. _______________________________________________________  If you are age 77 or older, your body mass index should be between 23-30. Your Body mass index is 24.39 kg/m. If this is out of the aforementioned range listed, please consider follow up with your Primary Care Provider.  If you are age 36 or younger, your body mass index should be between 19-25. Your Body mass index is 24.39 kg/m. If this is out of the aformentioned range listed, please consider follow up with your Primary Care Provider.  ________________________________________________________  The [facility] providers would like to encourage you to use MYCHART to communicate with providers for non-urgent requests or questions.  Due to long hold times on the telephone, sending your provider a message by [facility] may be a faster and more efficient way to get a response.  Please allow 48 business hours for a response.  Please remember that this is for non-urgent requests.  _______________________________________________________  We have sent the following medications to your pharmacy for you to pick up at your convenience: START: famotidine  40mg  one tablet at bedtime each night  You have been scheduled for an endoscopy. Please follow written instructions given to you at your visit today.  If you use inhalers (even only as needed), please bring them with you on the day of your procedure.  If you take any of the following medications, they will need to be adjusted prior to your procedure:   DO NOT TAKE 7 DAYS PRIOR TO TEST- Trulicity (dulaglutide) Ozempic, Wegovy (semaglutide) Mounjaro (tirzepatide) Bydureon Bcise (exanatide extended release)  DO NOT TAKE 1 DAY PRIOR TO YOUR TEST Rybelsus (semaglutide) Adlyxin (lixisenatide) Victoza  (liraglutide) Byetta (exanatide) ___________________________________________________________________________  Due to recent changes in healthcare laws, you may see the results of your imaging and laboratory studies on MyChart before your provider has had a chance to review them.  We understand that in some cases there may be results that are confusing or concerning to you. Not all laboratory results come back in the same time frame and the provider may be waiting for multiple results in order to interpret others.  Please give us  48 hours in order for your provider to thoroughly review all the results before contacting the office for clarification of your results.   Dysphagia precautions:  1. Take reflux medications 30+ minutes before food in the morning, pepcid  40 mg at night 2. Begin meals with warm beverage 3. Eat smaller more frequent meals 4. Eat slowly, taking small bites and sips 5. Alternate solids and liquids 6. Avoid foods/liquids that increase acid production 7. Sit upright during and for 30+ minutes after meals to facilitate esophageal clearing 8. All meats should be chopped finely.   If something gets hung in your esophagus and will not come up or go down, proceed to the emergency room.    Silent reflux: Not all heartburn burns...SABRASABRASABRA  What is LPR? Laryngopharyngeal reflux (LPR) or silent reflux is a condition in which acid that is made in the stomach travels up the esophagus (swallowing tube) and gets to the throat. Not everyone with reflux has a lot of heartburn or indigestion. In fact, many people with LPR never have heartburn. This is why LPR is called SILENT REFLUX, and the terms Silent reflux and LPR are often used interchangeably. Because LPR is silent, it is sometimes difficult to diagnose.  How can you tell if you  have LPR?  Chronic hoarseness- Some people have hoarseness that comes and goes throat clearing  Cough It can cause shortness of breath and cause asthma  like symptoms. a feeling of a lump in the throat  difficulty swallowing a problem with too much nose and throat drainage.  Some people will feel their esophagus spasm which feels like their heart beating hard and fast, this will usually be after a meal, at rest, or lying down at night.    How do I treat this? Treatment for LPR should be individualized, and your doctor will suggest the best treatment for you. Generally there are several treatments for LPR: changing habits and diet to reduce reflux,  medications to reduce stomach acid, and  surgery to prevent reflux. Most people with LPR need to modify how and when they eat, as well as take some medication, to get well. Sometimes, nonprescription liquid antacids, such as Maalox, Gelucil and Mylanta are recommended. When used, these antacids should be taken four times each day - one tablespoon one hour after each meal and before bedtime. Dietary and lifestyle changes alone are not often enough to control LPR - medications that reduce stomach acid are also usually needed. These must be prescribed by our doctor.   TIPS FOR REDUCING REFLUX AND LPR Control your LIFE-STYLE and your DIET! If you use tobacco, QUIT.  Smoking makes you reflux. After every cigarette you have some LPR.  Don't wear clothing that is too tight, especially around the waist (trousers, corsets, belts).  Do not lie down just after eating...in fact, do not eat within three hours of bedtime.  You should be on a low-fat diet.  Limit your intake of red meat.  Limit your intake of butter.  Avoid fried foods.  Avoid chocolate  Avoid cheese.  Avoid eggs. Specifically avoid caffeine (especially coffee and tea), soda pop (especially cola) and mints.  Avoid alcoholic beverages, particularly in the evening.

## 2023-10-08 ENCOUNTER — Ambulatory Visit (AMBULATORY_SURGERY_CENTER): Admitting: Gastroenterology

## 2023-10-08 ENCOUNTER — Encounter: Payer: Self-pay | Admitting: Gastroenterology

## 2023-10-08 VITALS — BP 139/76 | HR 51 | Temp 98.1°F | Resp 12 | Ht 70.0 in | Wt 170.0 lb

## 2023-10-08 DIAGNOSIS — Q399 Congenital malformation of esophagus, unspecified: Secondary | ICD-10-CM | POA: Diagnosis not present

## 2023-10-08 DIAGNOSIS — R1319 Other dysphagia: Secondary | ICD-10-CM

## 2023-10-08 DIAGNOSIS — R1310 Dysphagia, unspecified: Secondary | ICD-10-CM | POA: Diagnosis not present

## 2023-10-08 DIAGNOSIS — K3189 Other diseases of stomach and duodenum: Secondary | ICD-10-CM

## 2023-10-08 DIAGNOSIS — I2510 Atherosclerotic heart disease of native coronary artery without angina pectoris: Secondary | ICD-10-CM | POA: Diagnosis not present

## 2023-10-08 MED ORDER — OMEPRAZOLE 20 MG PO CPDR: 20.0000 mg | DELAYED_RELEASE_CAPSULE | Freq: Every day | ORAL | 3 refills | Status: AC

## 2023-10-08 MED ORDER — SODIUM CHLORIDE 0.9 % IV SOLN: 500.0000 mL | Freq: Once | INTRAVENOUS | Status: DC

## 2023-10-08 NOTE — Progress Notes (Signed)
 Called to room to assist during endoscopic procedure.  Patient ID and intended procedure confirmed with present staff. Received instructions for my participation in the procedure from the performing physician.

## 2023-10-08 NOTE — Progress Notes (Signed)
 Pt's states no medical or surgical changes since previsit or office visit.

## 2023-10-08 NOTE — Patient Instructions (Signed)
 Resume all of your previous medications today except the pepcid .  Dr wants you to take Omeprazole  20 mg daily 1/2 hour before breakfast.  Take it only on an empty stomach. Be sure to abide by the dilation diet explained by your recovery room nurse.  Read all of the handouts.  The biopsy results will be back within 3 weeks.    YOU HAD AN ENDOSCOPIC PROCEDURE TODAY AT THE [facility]:   Refer to the procedure report that was given to you for any specific questions about what was found during the examination.  If the procedure report does not answer your questions, please call your gastroenterologist to clarify.  If you requested that your care partner not be given the details of your procedure findings, then the procedure report has been included in a sealed envelope for you to review at your convenience later.  YOU SHOULD EXPECT: Some feelings of bloating in the abdomen. Passage of more gas than usual.  Walking can help get rid of the air that was put into your GI tract during the procedure and reduce the bloating. If you had a lower endoscopy (such as a colonoscopy or flexible sigmoidoscopy) you may notice spotting of blood in your stool or on the toilet paper. If you underwent a bowel prep for your procedure, you may not have a normal bowel movement for a few days.  Please Note:  You might notice some irritation and congestion in your nose or some drainage.  This is from the oxygen used during your procedure.  There is no need for concern and it should clear up in a day or so.  SYMPTOMS TO REPORT IMMEDIATELY:  Following upper endoscopy (EGD)  Vomiting of blood or coffee ground material  New chest pain or pain under the shoulder blades  Painful or persistently difficult swallowing  New shortness of breath  Fever of 100F or higher  Black, tarry-looking stools  For urgent or emergent issues, a gastroenterologist can be reached at any hour by calling [phone]. Do not use MyChart  messaging for urgent concerns.    DIET:  We do recommend nothing by mouth until 1030 am, the clear liquids until 11:30 am.  You need to stay on a soft diet for the rest of today.  Then you may proceed to your regular diet tomorrow. .  Drink plenty of fluids but you should avoid alcoholic beverages.  ACTIVITY:  You should plan to take it easy for the rest of today and you should NOT DRIVE or use heavy machinery until tomorrow (because of the sedation medicines used during the test).    FOLLOW UP: Our staff will call the number listed on your records the next business day following your procedure.  We will call around 7:15- 8:00 am to check on you and address any questions or concerns that you may have regarding the information given to you following your procedure. If we do not reach you, we will leave a message.     If any biopsies were taken you will be contacted by phone or by letter within the next 1-3 weeks.  Please call us  at [phone] if you have not heard about the biopsies in 3 weeks.    SIGNATURES/CONFIDENTIALITY: You and/or your care partner have signed paperwork which will be entered into your electronic medical record.  These signatures attest to the fact that that the information above on your After Visit Summary has been reviewed and is understood.  Full responsibility of the confidentiality of this discharge information lies with you and/or your care-partner.

## 2023-10-08 NOTE — Op Note (Signed)
 [facility] Patient Name: Ronald House Procedure Date: 10/08/2023 9:02 AM MRN: [id number] Endoscopist: Lynnie Bring , MD, [id number] Age: 78 Referring MD:  Date of Birth: 09-05-1945 Gender: Male Account #: [id number] Procedure:                Upper GI endoscopy Indications:              Dysphagia Medicines:                Monitored Anesthesia Care Procedure:                Pre-Anesthesia Assessment:                           - Prior to the procedure, a History and Physical                            was performed, and patient medications and                            allergies were reviewed. The patient's tolerance of                            previous anesthesia was also reviewed. The risks                            and benefits of the procedure and the sedation                            options and risks were discussed with the patient.                            All questions were answered, and informed consent                            was obtained. Prior Anticoagulants: The patient has                            taken no anticoagulant or antiplatelet agents. ASA                            Grade Assessment: II - A patient with mild systemic                            disease. After reviewing the risks and benefits,                            the patient was deemed in satisfactory condition to                            undergo the procedure.                           After obtaining informed consent, the endoscope was  passed under direct vision. Throughout the                            procedure, the patient's blood pressure, pulse, and                            oxygen saturations were monitored continuously. The                            Olympus Scope [id number] was introduced through the                            mouth, and advanced to the second part of duodenum.                            The upper GI endoscopy was accomplished  without                            difficulty. The patient tolerated the procedure                            well. Scope In: Scope Out: Findings:                 The examined esophagus was moderately tortuous and                             corkscrew s/o motility disorder. Biopsies were                            obtained from the proximal and distal esophagus                            with cold forceps for histology to r/o eosinophilic                            esophagitis. The scope was withdrawn. Dilation was                            performed with a Maloney dilator with mild                            resistance at 50 Fr and 52 Fr.                           The Z-line was regular and was found 40 cm from the                            incisors. No endoscopic evidence of achalasia or                            pseudoachalasia.                           Localized mild inflammation  characterized by                            congestion (edema) and erosions was found in the                            gastric antrum. Biopsies were taken with a cold                            forceps for histology.                           Localized mildly scalloped mucosa was found in the                            second portion of the duodenum. Biopsies were taken                            with a cold forceps for histology. Complications:            No immediate complications. Estimated Blood Loss:     Estimated blood loss: none. Impression:               - Tortuous esophagus (s/o presbyesophagus/motility                            disorder). Dilated.                           - Z-line regular, 40 cm from the incisors.                           - Gastritis. Biopsied.                           - Minimally scalloped mucosa was found in the                            duodenum, rule out celiac disease. Biopsied.                           - Biopsies were taken with a cold forceps for                             evaluation of eosinophilic esophagitis. Recommendation:           - Patient has a contact number available for                            emergencies. The signs and symptoms of potential                            delayed complications were discussed with the                            patient. Return to normal activities tomorrow.  Written discharge instructions were provided to the                            patient.                           - Post dilatation diet.                           - Continue present medications.                           - Await pathology results.                           - Use Prilosec (omeprazole ) 20 mg PO daily.                           - If still with problems, would recommend barium                            swallow possibly followed by esophageal manometry,                            if needed.                           - Chew food specially meats and breads well and eat                            slowly.                           - The findings and recommendations were discussed                            with the patient's family. Lynnie Bring, MD 10/08/2023 9:30:05 AM This report has been signed electronically.

## 2023-10-08 NOTE — Progress Notes (Signed)
 Sedate, gd SR, tolerated procedure well, VSS, report to RN

## 2023-10-08 NOTE — Progress Notes (Signed)
 10/05/2023 Ronald House [id number] 15-Feb-1946   Referring provider: Verdia Lombard, MD Primary GI doctor: Dr. Charlanne (Dr. Aneita)   ASSESSMENT AND PLAN:  Dysphagia distal esophagus x 1 years,  can have regurg of food/water but never vomiting, has increase mucus for years, new globulus sensation and worsening gag reflex, nausea at times Rare GERD, not on meds, no tobacco use, wine with dinner, no NSAIDS, no neck surgery or radiation Possible LPR, very concerned for stenosis, gastritis -Schedule EGD with dilatation to evaluate for stenosis, tumor, erosive/infectious esophagititis, and EOE. I discussed risks of EGD with patient today, including risk of sedation, bleeding or perforation.  Patient provides understanding and gave verbal consent to proceed. - start on pepcid  at night, consider adding PPI - consider barium swallow -In the interim patient advised about swallowing precautions.  -Eat slowly, chew food well before swallowing.  -Drink liquids in between each bite to avoid food impaction. - ER precautions discussed with the patient   Screening colonoscopy 2004 colonoscopy tics and HM 04/03/2013 colonoscopy Dr. Aneita moderate diverticulosis sigmoid colon, small internal hemorrhoids recall 10 years. No changes in stools, no family history of colon cancer Patient does not wish for any further screening and he is over the age of 52 and we normally stop surveillance/screening. He is aware that if he develops any symptoms going forward, we may need to proceed with a colonoscopy in the future   CAD status post stent LCx 2016 and balloon angioplasty focal restenosis 2023 Follows with Dr. Ladona last seen in the office 09/25/2023 Echo 2023 EF 70-75% no valvular abnormality Exercise stress test 2023 excellent tolerance no ischemia No chest pain, no SOB   History of prostate cancer 2013   Patient Care Team: Verdia Lombard, MD as PCP - General (Internal Medicine) Ladona Heinz, MD as  PCP - Cardiology (Cardiology) Ladona Heinz, MD as Consulting Physician (Cardiology) Livingston Rigg, MD (Inactive) as Consulting Physician (Dermatology)   HISTORY OF PRESENT ILLNESS: 78 y.o. male with a past medical history listed below presents for evaluation of dysphagia.    Discussed the use of AI scribe software for clinical note transcription with the patient, who gave verbal consent to proceed.   History of Present Illness   Ronald House is a 78 year old male with diverticulosis and coronary artery disease who presents with dysphagia and hoarseness.   He has experienced difficulty swallowing for nearly a year, with food feeling 'stuck' in his throat. This is accompanied by belching, gas, and coughing, sometimes necessitating him to leave the table. Drinking water exacerbates the sensation. No weight loss, abdominal pain, dark stools, or blood in the stool.   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 The mucus has changed from yellowish to white. He also reports a sensation of constriction in his throat, worsened by brushing his teeth, and a heightened gag reflex. He has experienced hoarseness, which he considers his 'normal voice now'.   No significant heartburn or sustained reflux symptoms, although he occasionally experiences what he describes as 'normal' reflux. No history of tobacco use beyond a brief period at age 48 and reports rare alcohol consumption, typically a glass with dinner or occasionally something stronger with his brother.   He takes aspirin  following a stent placement and was previously on carvedilol , which was discontinued due to muscle fatigue and vertigo. He has a history of degenerative disc disease with  no surgical interventions, and he has received injections for back pain.       He  reports that he has never smoked. He has never used smokeless tobacco. He  reports current alcohol use of about 5.0 standard drinks of alcohol per week. He reports that he does not use drugs.   RELEVANT GI HISTORY, IMAGING AND LABS: Results   LABS PSA: 0.02 (10/05/2023)   DIAGNOSTIC Colonoscopy: Diverticulosis, small internal hemorrhoids (04/03/2013) Stress test: Normal (2023)       CBC Labs (Brief)          Component Value Date/Time    WBC 6.3 08/07/2021 0800    RBC 4.61 08/07/2021 0800    HGB 14.6 08/07/2021 0800    HCT 43.4 08/07/2021 0800    PLT 220 08/07/2021 0800    MCV 94.1 08/07/2021 0800    MCV 89.3 10/01/2015 1127    MCH 31.7 08/07/2021 0800    MCHC 33.6 08/07/2021 0800    RDW 13.1 08/07/2021 0800    LYMPHSABS 2.2 08/07/2021 0800    MONOABS 0.5 08/07/2021 0800    EOSABS 0.2 08/07/2021 0800    BASOSABS 0.0 08/07/2021 0800      Recent Labs (within last 365 days)  No results for input(s): HGB in the last 8760 hours.     CMP     Labs (Brief)          Component Value Date/Time    NA 138 08/07/2021 0800    K 4.7 08/07/2021 0800    CL 105 08/07/2021 0800    CO2 25 08/07/2021 0800    GLUCOSE 98 08/07/2021 0800    BUN 27 (H) 08/07/2021 0800    CREATININE 1.39 (H) 08/07/2021 0800    CREATININE 1.25 (H) 08/21/2020 1312    CALCIUM  9.1 08/07/2021 0800    PROT 6.2 (L) 08/07/2021 0800    ALBUMIN 3.9 08/07/2021 0800    AST 29 08/07/2021 0800    ALT 22 08/07/2021 0800    ALKPHOS 75 08/07/2021 0800    BILITOT 0.7 08/07/2021 0800    GFRNONAA 53 (L) 08/07/2021 0800    GFRAA >60 12/25/2018 0943          Latest Ref Rng & Units 08/07/2021    8:00 AM 08/21/2020    1:12 PM 07/09/2020   11:30 AM  Hepatic Function  Total Protein 6.5 - 8.1 g/dL 6.2  6.9  6.9   Albumin 3.5 - 5.0 g/dL 3.9       AST 15 - 41 U/L 29  19  31    ALT 0 - 44 U/L 22  15  37   Alk Phosphatase 38 - 126 U/L 75       Total Bilirubin 0.3 - 1.2 mg/dL 0.7  0.7  0.6       Current Medications:      Current Outpatient Medications (Cardiovascular):    atorvastatin   (LIPITOR ) 80 MG tablet, TAKE 1 TABLET(80 MG) BY MOUTH DAILY AT 6 PM (Patient taking differently: Pt taking 5 days instead of 7)   olmesartan -hydrochlorothiazide  (BENICAR  HCT) 40-12.5 MG tablet, TAKE 1 TABLET BY MOUTH EVERY MORNING   nitroGLYCERIN  (NITROSTAT ) 0.4 MG SL tablet, Place 1 tablet (0.4 mg total) under the tongue every 5 (five) minutes x 3 doses as needed for chest pain. (Patient not taking: Reported on 10/05/2023)     Current Outpatient Medications (Analgesics):    aspirin  EC 81 MG EC tablet, Take 1 tablet (81 mg total) by mouth daily.  Current Outpatient Medications (Other):    cholecalciferol (VITAMIN D3) 25 MCG (1000 UNIT) tablet, Take 1,000 Units by mouth daily.   COLLAGEN PO, Take by mouth.   famotidine  (PEPCID ) 40 MG tablet, Take 1 tablet (40 mg total) by mouth at bedtime.   lidocaine  4 %, Place 1 patch onto the skin once a week.   Multiple Vitamin (MULTIVITAMIN) tablet, Take 1 tablet by mouth daily.   Propylene Glycol (SYSTANE COMPLETE OP), Place 1 drop into both eyes daily as needed (dry eyes).   Medical History:      Past Medical History:  Diagnosis Date   Anxiety     Arteriosclerosis of both carotid arteries 06/02/2018   Arthritis      lower back (11/24/2014)   Bulging lumbar disc     Chronic lower back pain     Coronary artery disease     DDD (degenerative disc disease), lumbar     Dyspnea on exertion 06/02/2018   GERD (gastroesophageal reflux disease)     Hematuria     Peyronie disease     Prostate cancer (HCC)     Undiagnosed cardiac murmurs      childhood/unknown if present issue        Allergies:  Allergies  No Known Allergies      Surgical History:  He  has a past surgical history that includes Open treatment zygomatic arch fracture (~ 1980); Finger fracture surgery (Right, 1980's); Radioactive seed implant (05/22/2011); Coronary angioplasty with stent (11/24/2014); Tonsillectomy and adenoidectomy (1950's); Fracture surgery; Prostate biopsy  (<05/2011); Cardiac catheterization (N/A, 11/24/2014); Cardiac catheterization (N/A, 11/24/2014); LEFT HEART CATH AND CORONARY ANGIOGRAPHY (N/A, 07/29/2021); and CORONARY BALLOON ANGIOPLASTY (N/A, 07/29/2021). Family History:  His family history includes Benign prostatic hyperplasia in his father; Cancer in his father; Heart disease in his brother.   REVIEW OF SYSTEMS  : All other systems reviewed and negative except where noted in the History of Present Illness.   PHYSICAL EXAM: BP 122/70   Pulse 78   Ht 5' 10 (1.778 m)   Wt 170 lb (77.1 kg)   BMI 24.39 kg/m  Physical Exam   GENERAL APPEARANCE: Well nourished, in no apparent distress. HEENT: No cervical lymphadenopathy, unremarkable thyroid , sclerae anicteric, conjunctiva pink. RESPIRATORY: Respiratory effort normal, breath sounds equal bilaterally without rales, rhonchi, or wheezing. Lungs clear to auscultation. CARDIO: Regular rate and rhythm with no murmurs, rubs, or gallops, peripheral pulses intact. ABDOMEN: Soft, non-distended, active bowel sounds in all four quadrants, non-tender to palpation, no rebound, no mass appreciated. RECTAL: Declines. MUSCULOSKELETAL: Full range of motion, normal gait, without edema. SKIN: Dry, intact without rashes or lesions. No jaundice. NEURO: Alert, oriented, no focal deficits. PSYCH: Cooperative, normal mood and affect.       Alan JONELLE Coombs, PA-C   Attending physician's note   I have taken history, reviewed the chart and examined the patient. I performed a substantive portion of this encounter, including complete performance of at least one of the key components, in conjunction with the APP. I agree with the Advanced Practitioner's note, impression and recommendations.   For EGD today.   Anselm Bring, MD [facility] [phone]

## 2023-10-09 ENCOUNTER — Telehealth: Payer: Self-pay

## 2023-10-09 NOTE — Telephone Encounter (Signed)
 No answer after follow up call. Voice message left.

## 2023-10-12 LAB — SURGICAL PATHOLOGY

## 2023-10-15 ENCOUNTER — Ambulatory Visit: Payer: Self-pay | Admitting: Gastroenterology

## 2023-11-12 ENCOUNTER — Other Ambulatory Visit: Payer: Self-pay | Admitting: Physician Assistant

## 2023-12-03 DIAGNOSIS — E782 Mixed hyperlipidemia: Secondary | ICD-10-CM | POA: Diagnosis not present

## 2023-12-03 DIAGNOSIS — I129 Hypertensive chronic kidney disease with stage 1 through stage 4 chronic kidney disease, or unspecified chronic kidney disease: Secondary | ICD-10-CM | POA: Diagnosis not present

## 2023-12-03 DIAGNOSIS — I5032 Chronic diastolic (congestive) heart failure: Secondary | ICD-10-CM | POA: Diagnosis not present

## 2023-12-03 DIAGNOSIS — I2510 Atherosclerotic heart disease of native coronary artery without angina pectoris: Secondary | ICD-10-CM | POA: Diagnosis not present

## 2023-12-03 DIAGNOSIS — N1831 Chronic kidney disease, stage 3a: Secondary | ICD-10-CM | POA: Diagnosis not present

## 2023-12-03 DIAGNOSIS — I130 Hypertensive heart and chronic kidney disease with heart failure and stage 1 through stage 4 chronic kidney disease, or unspecified chronic kidney disease: Secondary | ICD-10-CM | POA: Diagnosis not present

## 2023-12-07 ENCOUNTER — Other Ambulatory Visit: Payer: Self-pay

## 2023-12-07 DIAGNOSIS — I10 Essential (primary) hypertension: Secondary | ICD-10-CM

## 2023-12-07 DIAGNOSIS — I2510 Atherosclerotic heart disease of native coronary artery without angina pectoris: Secondary | ICD-10-CM

## 2023-12-08 MED ORDER — OLMESARTAN MEDOXOMIL-HCTZ 40-12.5 MG PO TABS: 1.0000 | ORAL_TABLET | ORAL | 3 refills | Status: AC

## 2023-12-10 DIAGNOSIS — I2510 Atherosclerotic heart disease of native coronary artery without angina pectoris: Secondary | ICD-10-CM | POA: Diagnosis not present

## 2023-12-10 DIAGNOSIS — I5032 Chronic diastolic (congestive) heart failure: Secondary | ICD-10-CM | POA: Diagnosis not present

## 2023-12-10 DIAGNOSIS — I129 Hypertensive chronic kidney disease with stage 1 through stage 4 chronic kidney disease, or unspecified chronic kidney disease: Secondary | ICD-10-CM | POA: Diagnosis not present

## 2023-12-10 DIAGNOSIS — Z23 Encounter for immunization: Secondary | ICD-10-CM | POA: Diagnosis not present

## 2023-12-10 DIAGNOSIS — N1831 Chronic kidney disease, stage 3a: Secondary | ICD-10-CM | POA: Diagnosis not present

## 2023-12-10 DIAGNOSIS — E782 Mixed hyperlipidemia: Secondary | ICD-10-CM | POA: Diagnosis not present

## 2023-12-10 DIAGNOSIS — I130 Hypertensive heart and chronic kidney disease with heart failure and stage 1 through stage 4 chronic kidney disease, or unspecified chronic kidney disease: Secondary | ICD-10-CM | POA: Diagnosis not present

## 2023-12-21 ENCOUNTER — Other Ambulatory Visit: Payer: Self-pay | Admitting: Cardiology

## 2024-01-01 DIAGNOSIS — Z23 Encounter for immunization: Secondary | ICD-10-CM | POA: Diagnosis not present
